# Patient Record
Sex: Male | Born: 1950 | Race: Black or African American | Hispanic: No | Smoking: Former smoker
Health system: Southern US, Community
[De-identification: ages and names within clinical notes are randomized; demographics above are authoritative.]

## PROBLEM LIST (undated history)

## (undated) DIAGNOSIS — I10 Essential (primary) hypertension: Secondary | ICD-10-CM

## (undated) DIAGNOSIS — J449 Chronic obstructive pulmonary disease, unspecified: Secondary | ICD-10-CM

## (undated) DIAGNOSIS — K219 Gastro-esophageal reflux disease without esophagitis: Secondary | ICD-10-CM

## (undated) DIAGNOSIS — E782 Mixed hyperlipidemia: Secondary | ICD-10-CM

## (undated) HISTORY — DX: Chronic obstructive pulmonary disease, unspecified: J44.9

## (undated) HISTORY — PX: HERNIA REPAIR: SHX51

---

## 2005-01-31 HISTORY — PX: OTHER SURGICAL HISTORY: SHX169

## 2005-01-31 HISTORY — PX: HERNIA REPAIR: SHX51

## 2007-03-30 ENCOUNTER — Emergency Department (HOSPITAL_COMMUNITY): Admission: EM | Admit: 2007-03-30 | Discharge: 2007-03-30 | Payer: Self-pay | Admitting: Emergency Medicine

## 2007-06-15 ENCOUNTER — Ambulatory Visit: Payer: Self-pay | Admitting: Pulmonary Disease

## 2007-06-15 DIAGNOSIS — R930 Abnormal findings on diagnostic imaging of skull and head, not elsewhere classified: Secondary | ICD-10-CM

## 2007-06-15 DIAGNOSIS — J438 Other emphysema: Secondary | ICD-10-CM

## 2007-06-22 ENCOUNTER — Telehealth (INDEPENDENT_AMBULATORY_CARE_PROVIDER_SITE_OTHER): Payer: Self-pay | Admitting: *Deleted

## 2007-06-29 ENCOUNTER — Encounter (INDEPENDENT_AMBULATORY_CARE_PROVIDER_SITE_OTHER): Payer: Self-pay | Admitting: *Deleted

## 2007-12-12 ENCOUNTER — Telehealth (INDEPENDENT_AMBULATORY_CARE_PROVIDER_SITE_OTHER): Payer: Self-pay | Admitting: *Deleted

## 2008-02-19 ENCOUNTER — Telehealth (INDEPENDENT_AMBULATORY_CARE_PROVIDER_SITE_OTHER): Payer: Self-pay | Admitting: *Deleted

## 2008-02-27 ENCOUNTER — Ambulatory Visit: Payer: Self-pay | Admitting: Pulmonary Disease

## 2008-02-27 LAB — CONVERTED CEMR LAB
BUN: 7 mg/dL (ref 6–23)
CO2: 32 meq/L (ref 19–32)
Creatinine, Ser: 0.8 mg/dL (ref 0.4–1.5)
GFR calc Af Amer: 128 mL/min
Glucose, Bld: 82 mg/dL (ref 70–99)

## 2008-02-28 ENCOUNTER — Ambulatory Visit: Payer: Self-pay | Admitting: Cardiology

## 2008-03-21 ENCOUNTER — Ambulatory Visit: Payer: Self-pay | Admitting: Pulmonary Disease

## 2008-03-21 ENCOUNTER — Telehealth: Payer: Self-pay | Admitting: Gastroenterology

## 2008-03-28 ENCOUNTER — Telehealth: Payer: Self-pay | Admitting: Pulmonary Disease

## 2008-03-28 ENCOUNTER — Encounter: Payer: Self-pay | Admitting: Pulmonary Disease

## 2008-04-09 ENCOUNTER — Encounter: Payer: Self-pay | Admitting: Pulmonary Disease

## 2008-04-11 ENCOUNTER — Ambulatory Visit: Payer: Self-pay | Admitting: Gastroenterology

## 2008-04-11 DIAGNOSIS — R933 Abnormal findings on diagnostic imaging of other parts of digestive tract: Secondary | ICD-10-CM

## 2008-04-17 ENCOUNTER — Ambulatory Visit: Payer: Self-pay | Admitting: Gastroenterology

## 2008-04-17 DIAGNOSIS — K2970 Gastritis, unspecified, without bleeding: Secondary | ICD-10-CM | POA: Insufficient documentation

## 2008-04-17 DIAGNOSIS — K2990 Gastroduodenitis, unspecified, without bleeding: Secondary | ICD-10-CM

## 2008-04-18 ENCOUNTER — Encounter: Payer: Self-pay | Admitting: Gastroenterology

## 2008-08-01 ENCOUNTER — Telehealth: Payer: Self-pay | Admitting: Pulmonary Disease

## 2008-09-24 ENCOUNTER — Ambulatory Visit: Payer: Self-pay | Admitting: Pulmonary Disease

## 2008-10-03 ENCOUNTER — Telehealth (INDEPENDENT_AMBULATORY_CARE_PROVIDER_SITE_OTHER): Payer: Self-pay | Admitting: *Deleted

## 2008-10-07 ENCOUNTER — Telehealth (INDEPENDENT_AMBULATORY_CARE_PROVIDER_SITE_OTHER): Payer: Self-pay | Admitting: *Deleted

## 2008-10-31 ENCOUNTER — Telehealth: Payer: Self-pay | Admitting: Pulmonary Disease

## 2009-03-10 ENCOUNTER — Telehealth: Payer: Self-pay | Admitting: Pulmonary Disease

## 2009-03-16 ENCOUNTER — Ambulatory Visit: Payer: Self-pay | Admitting: Pulmonary Disease

## 2009-03-20 ENCOUNTER — Telehealth: Payer: Self-pay | Admitting: Pulmonary Disease

## 2009-03-27 DIAGNOSIS — J441 Chronic obstructive pulmonary disease with (acute) exacerbation: Secondary | ICD-10-CM | POA: Insufficient documentation

## 2009-03-31 ENCOUNTER — Ambulatory Visit: Payer: Self-pay | Admitting: Pulmonary Disease

## 2009-03-31 ENCOUNTER — Telehealth: Payer: Self-pay | Admitting: Pulmonary Disease

## 2009-04-06 ENCOUNTER — Telehealth: Payer: Self-pay | Admitting: Pulmonary Disease

## 2009-04-23 ENCOUNTER — Telehealth (INDEPENDENT_AMBULATORY_CARE_PROVIDER_SITE_OTHER): Payer: Self-pay | Admitting: *Deleted

## 2009-05-01 ENCOUNTER — Telehealth (INDEPENDENT_AMBULATORY_CARE_PROVIDER_SITE_OTHER): Payer: Self-pay | Admitting: *Deleted

## 2009-05-11 ENCOUNTER — Telehealth: Payer: Self-pay | Admitting: Pulmonary Disease

## 2009-06-17 ENCOUNTER — Ambulatory Visit: Payer: Self-pay | Admitting: Pulmonary Disease

## 2009-06-23 ENCOUNTER — Telehealth (INDEPENDENT_AMBULATORY_CARE_PROVIDER_SITE_OTHER): Payer: Self-pay | Admitting: *Deleted

## 2009-07-09 ENCOUNTER — Telehealth (INDEPENDENT_AMBULATORY_CARE_PROVIDER_SITE_OTHER): Payer: Self-pay | Admitting: *Deleted

## 2009-07-16 ENCOUNTER — Telehealth (INDEPENDENT_AMBULATORY_CARE_PROVIDER_SITE_OTHER): Payer: Self-pay | Admitting: *Deleted

## 2009-09-12 ENCOUNTER — Encounter: Payer: Self-pay | Admitting: Pulmonary Disease

## 2009-09-15 ENCOUNTER — Ambulatory Visit: Payer: Self-pay | Admitting: Pulmonary Disease

## 2009-11-13 ENCOUNTER — Telehealth: Payer: Self-pay | Admitting: Pulmonary Disease

## 2009-11-25 ENCOUNTER — Telehealth (INDEPENDENT_AMBULATORY_CARE_PROVIDER_SITE_OTHER): Payer: Self-pay | Admitting: *Deleted

## 2010-01-04 ENCOUNTER — Telehealth: Payer: Self-pay | Admitting: Pulmonary Disease

## 2010-01-07 ENCOUNTER — Emergency Department (HOSPITAL_COMMUNITY): Admission: EM | Admit: 2010-01-07 | Discharge: 2009-06-03 | Payer: Self-pay | Admitting: Emergency Medicine

## 2010-01-13 ENCOUNTER — Ambulatory Visit: Payer: Self-pay | Admitting: Pulmonary Disease

## 2010-03-02 NOTE — Progress Notes (Signed)
Summary: needs disability papers  Phone Note Call from Patient Call back at [phone]   Caller: Patient Call For: clance Summary of Call: he needs his disability papers filled out. they have to to be in by the 28th. Initial call taken by: Valinda Hoar,  April 23, 2009 12:40 PM  Follow-up for Phone Call        per emr, it looks like [facility] has already filled out his part of the paperwork (see scanned reports) therefore paperwork must be down in [employer] with Fleet Contras.  called and spoke with pt to inform him of the above information.  pt states rachel has already called him and states paperwork should be ready tomorrow.  nothing further needed.  Aundra Millet Reynolds LPN  April 23, 2009 1:40 PM

## 2010-03-02 NOTE — Progress Notes (Signed)
  Phone Note Other Incoming   Request: Send information Summary of Call: Request for records received from [employer]. Request forwarded to [employer].     

## 2010-03-02 NOTE — Progress Notes (Signed)
Summary: diagnosis  Phone Note Call from Patient Call back at [phone]   Caller: Patient Call For: Hanalei Glace Summary of Call: pt have questions about diagnosis .disability is requesting Initial call taken by: Rickard Patience,  May 11, 2009 1:21 PM  Follow-up for Phone Call        called, spoke with pt.  Pt states he received a letter stating his disablilty claim is being held up because they need further information.  Requesting KC to dictate letter with his diagnosis on it and how long he should be out.  Claim # [id number].  requesting this to be faxed to [employer] [phone] once completed.  WIll forward to [facility] - pls advise if you will dictate this letter for pt.  Thanks! Gweneth Dimitri RN  May 11, 2009 3:53 PM   Additional Follow-up for Phone Call Additional follow up Details #1::        none of [place] do disability letters.  We typically fill  out paperwork the company sends [place], and they determine disability.  [employer] needs to tell me what more information they need.  If I have filled out the paperwork, it should be self explanatory.  Please find out if I have even filled out paperwork.  There are documents scanned into the chart that I havent even signed. Additional Follow-up by: Barbaraann Share MD,  May 11, 2009 4:25 PM    Additional Follow-up for Phone Call Additional follow up Details #2::    Looking over the papers signed by [facility] for disability diagnosis and time of disability is on the paperwork. i advised the pt that it might be best if [employer] calls [place] directly in regards to what is needed. He states he will do so. Carron Curie CMA  May 11, 2009 5:14 PM

## 2010-03-02 NOTE — Progress Notes (Signed)
Summary: prescript  Phone Note Call from Patient Call back at [phone]   Caller: Significant Otherstacey Call For: [name] Summary of Call: need 3 months supply of symbicort sent to [facility] Initial call taken by: Rickard Patience,  March 20, 2009 3:08 PM    Prescriptions: SYMBICORT 160-4.5 MCG/ACT  AERO (BUDESONIDE-FORMOTEROL FUMARATE) Two puffs twice daily  #3 x 3   Entered by:   Randell Loop CMA   Authorized by:   Barbaraann Share MD   Signed by:   Randell Loop CMA on 03/20/2009   Method used:   Faxed to ...       [facility] (mail-order)             ,          Ph: [phone]       Fax: [phone]   RxID:   [id number]

## 2010-03-02 NOTE — Progress Notes (Signed)
Summary: foms completed  Phone Note Call from Patient Call back at [phone]   Caller: Patient Call For: Dempsey Ahonen Summary of Call: Pt said [employer] is supposed to be faxing papers over today and he needs to have them completed ASAP. Initial call taken by: Darletta Moll,  April 06, 2009 8:22 AM  Follow-up for Phone Call        have you seen any papers on this pt? Carron Curie CMA  April 06, 2009 11:00 AM   yes, i just received paperwork off of fax machine.  will give to [facility] in [facility] to fill out paperwork first and then will be given to [facility] to sign.  Aundra Millet Reynolds LPN  April 07, 4694 1:53 PM   pt advised received and that it was sent to [employer]. Carron Curie CMA  April 06, 2009 2:28 PM

## 2010-03-02 NOTE — Assessment & Plan Note (Signed)
Summary: rov for emphysema   CC:  3 month follow up.  Pt states breathing is unchanged from last OV however he is coughing more frequently.  Cough is prod with white mucus.  Denies wheezing and chest tightness.  Smoking 1/2 ppd..  History of Present Illness: the pt comes in today for f/u of his known emphysema.  He is staying on his bronchodilator regimen, but unfortunately is continuing to smoke.  He has a chronic am smoker's cough, but is not having chest congestion or purulence.  His doe is at his usual baseline.  Preventive Screening-Counseling & Management  Alcohol-Tobacco     Smoking Status: current     Smoking Cessation Counseling: yes     Packs/Day: 0.5     Tobacco Counseling: to quit use of tobacco products  Current Medications (verified): 1)  Ventolin Hfa 108 (90 Base) Mcg/act Aers (Albuterol Sulfate) .... 2 Puffs Every 4-6 Hours As Needed For Shortness of Breath 2)  Symbicort 160-4.5 Mcg/act  Aero (Budesonide-Formoterol Fumarate) .... Two Puffs Twice Daily 3)  Spiriva Handihaler 18 Mcg  Caps (Tiotropium Bromide Monohydrate) .... One Puff in Handihaler Daily  Allergies (verified): No Known Drug Allergies  Social History: Packs/Day:  0.5  Review of Systems       The patient complains of shortness of breath with activity and productive cough.  The patient denies shortness of breath at rest, non-productive cough, coughing up blood, chest pain, irregular heartbeats, acid heartburn, indigestion, loss of appetite, weight change, abdominal pain, difficulty swallowing, sore throat, tooth/dental problems, headaches, nasal congestion/difficulty breathing through nose, sneezing, itching, ear ache, anxiety, depression, hand/feet swelling, joint stiffness or pain, rash, change in color of mucus, and fever.    Vital Signs:  Patient profile:   60 year old male Height:      74 inches Weight:      189.25 pounds BMI:     24.39 O2 Sat:      91 % on Room air Temp:     98.3 degrees F  oral Pulse rate:   96 / minute BP sitting:   124 / 82  (right arm) Cuff size:   regular  Vitals Entered By: Gweneth Dimitri RN (September 15, 2009 9:45 AM)  O2 Flow:  Room air CC: 3 month follow up.  Pt states breathing is unchanged from last OV however he is coughing more frequently.  Cough is prod with white mucus.  Denies wheezing and chest tightness.  Smoking 1/2 ppd. Comments Medications reviewed with patient Daytime contact number verified with patient. Gweneth Dimitri RN  September 15, 2009 9:45 AM    Physical Exam  General:  wd male in nad Lungs:  mild decreased bs, no wheezing or rhonchi  Heart:  rrr, no mrg Extremities:  no edema noted, no cyanosis  Neurologic:  alert and oriented, moves all 4.   Impression & Recommendations:  Problem # 1:  EMPHYSEMA (ICD-492.8)  the pt is staying on his meds, and is participating in pulmonary rehab at [facility].  I have told him his cough and mucus will resolve if he is able to stop smoking.  This is the key to further improvment for him.  He is to stay on current meds, and to f/u with me in 4mos.  Other Orders: Est. Patient Level III (13086) Tobacco use cessation intermediate 3-10 minutes (57846)  Patient Instructions: 1)  no change in meds. 2)  stop smoking!! 3)  followup with me in 4mos or sooner if having problems.  Prescriptions: SPIRIVA HANDIHALER 18 MCG  CAPS (TIOTROPIUM BROMIDE MONOHYDRATE) one puff in handihaler daily  #30 x 6   Entered and Authorized by:   Barbaraann Share MD   Signed by:   Barbaraann Share MD on 09/15/2009   Method used:   Print then Give to Patient   RxID:   [id number] SYMBICORT 160-4.5 MCG/ACT  AERO (BUDESONIDE-FORMOTEROL FUMARATE) Two puffs twice daily  #1 x 6   Entered and Authorized by:   Barbaraann Share MD   Signed by:   Barbaraann Share MD on 09/15/2009   Method used:   Print then Give to Patient   RxID:   [id number] VENTOLIN HFA 108 (90 BASE) MCG/ACT AERS (ALBUTEROL SULFATE) 2 puffs every  4-6 hours as needed for shortness of breath  #1 x 6   Entered and Authorized by:   Barbaraann Share MD   Signed by:   Barbaraann Share MD on 09/15/2009   Method used:   Print then Give to Patient   RxID:   [id number]

## 2010-03-02 NOTE — Progress Notes (Signed)
Summary: Records request from [employer]  Request for records received from [employer].Request forwarded to [employer]. Jacob Pace  May 01, 2009 10:11 AM

## 2010-03-02 NOTE — Progress Notes (Signed)
Summary: symbicort/ [facility]     Phone Note Call from Patient   Caller: Other Relative Call For: Ajaya Crutchfield Summary of Call: caller stacy malloy calling on pt's behalf to request symbicort - 90 days supply- pls call in to [facility]. caller's # [phone] Initial call taken by: Tivis Ringer, CNA,  March 10, 2009 9:29 AM  Follow-up for Phone Call        pt last saw [facility] 09-24-2008 and was told to f/u in 6 months.  No appts sched.  LMOMTCBx2.  Pt needs to make f/u appt and then we can send rx to [facility].  Arman Filter LPN  March 10, 2009 9:42 AM   Pt has appt schedulled 03/16/2009 @ 11:45am. Follow-up by: Eugene Gavia,  March 11, 2009 9:36 AM  Additional Follow-up for Phone Call Additional follow up Details #1::        Spoke with Aundra Millet about this; ok to give sample until pt comes in and then get a Rx for 90 day supply at OV. Pt is aware and sample at front desk. Reynaldo Minium CMA  March 11, 2009 10:40 AM

## 2010-03-02 NOTE — Progress Notes (Signed)
Summary: pt took prednisone incorrectly  Phone Note Call from Patient   Caller: Significant Other Call For: Edith Groleau Summary of Call: pt's girlfriend calling re: pt. says he took prednisone "4 pills at one time - "4 x a day" now is out! call Jimmye Norman at [phone] Initial call taken by: Tivis Ringer, CNA,  March 20, 2009 11:04 AM  Follow-up for Phone Call        caller advised pt took 4 pills four times a day on Tues and Wed and is now out. caller advised pt's breathing has not changed, pt is still smoking, and pt has not had any reations to same. Please advise. Thanks. Zackery Barefoot CMA  March 20, 2009 11:52 AM   Additional Follow-up for Phone Call Additional follow up Details #1::        let him know that we are going to see how he does, no further prednisone.  He must quit smoking if he wants to get better.  Keep upcoming apptm Additional Follow-up by: Barbaraann Share MD,  March 20, 2009 5:07 PM    Additional Follow-up for Phone Call Additional follow up Details #2::    called pts girlfriend stacey---she is aware of KC recs and is also aware of symbicort that has been faxed to [facility]---she is aware of appt on 3-1 and will let pt know to stop all smoking. Randell Loop CMA  March 20, 2009 5:20 PM

## 2010-03-02 NOTE — Assessment & Plan Note (Signed)
Summary: acute sick visit for worsening dyspnea   Copy to:  Marcelyn Bruins, MD Primary Provider/Referring Provider:  n/a  CC:  Pt is here for a 6 month f/u appt.  Pt states his breathing has gotten worse.  More sob with exertion.  Pt states he is using his Ventolin every day.  Pt states he has decreased his smoking to 4 cigs daily.  Pt also c/o "loss of bladder during stressful situations. " .  History of Present Illness: The pt comes in today for f/u of his known emphysema.  He is maintaining on his bronchodilator regimen, but unfortunately continues to smoke.  He feels that his breathing is a little worse than usual, but denies chest congestion, tightness, or purulence.  He has not had f/c/s, no wheezing.  Medications Prior to Update: 1)  Ventolin Hfa 108 (90 Base) Mcg/act Aers (Albuterol Sulfate) .... 2 Puffs Every 4-6 Hours As Needed For Shortness of Breath 2)  Symbicort 160-4.5 Mcg/act  Aero (Budesonide-Formoterol Fumarate) .... Two Puffs Twice Daily 3)  Spiriva Handihaler 18 Mcg  Caps (Tiotropium Bromide Monohydrate) .... One Puff in Handihaler Daily  Allergies (verified): No Known Drug Allergies  Review of Systems       The patient complains of shortness of breath with activity, shortness of breath at rest, and non-productive cough.  The patient denies productive cough, coughing up blood, chest pain, irregular heartbeats, acid heartburn, indigestion, loss of appetite, weight change, abdominal pain, difficulty swallowing, sore throat, tooth/dental problems, headaches, nasal congestion/difficulty breathing through nose, sneezing, itching, ear ache, anxiety, depression, hand/feet swelling, joint stiffness or pain, rash, change in color of mucus, and fever.    Vital Signs:  Patient profile:   60 year old male Height:      74 inches Weight:      195.25 pounds BMI:     25.16 O2 Sat:      90 % on Room air Temp:     98.3 degrees F oral Pulse rate:   84 / minute BP sitting:   134 / 92   (right arm) Cuff size:   regular  Vitals Entered By: Arman Filter LPN (March 16, 2009 11:54 AM)  O2 Flow:  Room air CC: Pt is here for a 6 month f/u appt.  Pt states his breathing has gotten worse.  More sob with exertion.  Pt states he is using his Ventolin every day.  Pt states he has decreased his smoking to 4 cigs daily.  Pt also c/o "loss of bladder during stressful situations. "  Comments Medications reviewed with patient Arman Filter LPN  March 16, 2009 11:54 AM    Physical Exam  General:  wd male in nad Lungs:  decreased bs throughout, but no wheezing Heart:  rrr, no mrg Extremities:  no edema or cyanosis Neurologic:  alert and oriented, moves all 4.   Impression & Recommendations:  Problem # 1:  EMPHYSEMA (ICD-492.8)  I suspect the pt has an early copd exacerbation.  I have explained to him that no medications will keep him well if he continues to smoke.  Will treat him with a short course of prednisone to see if he improves, but he MUST stop smoking.  He is to call us if he develops increased congestion or purulence which may require abx.  Medications Added to Medication List This Visit: 1)  Prednisone 10 Mg Tabs (Prednisone) .... 4 each day for 4 days, then 2 each day for 4 days, then 1 each  day for 4 days, then stop  Other Orders: Est. Patient Level IV (83151)  Patient Instructions: 1)  trial of prednisone for next 2 weeks to see if we can improve things 2)  stay on symbicort and spiriva religiously 3)  you must stop smoking. 4)  followup with me in 2 weeks.   Prescriptions: PREDNISONE 10 MG  TABS (PREDNISONE) 4 each day for 4 days, then 2 each day for 4 days, then 1 each day for 4 days, then stop  #qs x 0   Entered and Authorized by:   Barbaraann Share MD   Signed by:   Barbaraann Share MD on 03/16/2009   Method used:   Print then Give to Patient   RxID:   [id number]

## 2010-03-02 NOTE — Progress Notes (Signed)
Summary: [place] Approval for Symbicort 160/4.5 11-25-09 through 11-25-11  Phone Note Outgoing Call   Call placed by: Reynaldo Minium CMA,  November 25, 2009 4:45 PM Call placed to: [place] Dept-[place] Summary of Call: Recieved [place] ([facility] [phone]); needed on Symbicort 160/4.5 inhaler; spoke with insurance-approval form 11-25-09 through 11-25-11. I spoke with Pharmacy and they are aware. Initial call taken by: Reynaldo Minium CMA,  November 25, 2009 4:47 PM

## 2010-03-02 NOTE — Progress Notes (Signed)
Summary: SAMPLE of symbicort  Phone Note Call from Patient   Caller: CAREGIVER STACY Call For: [facility] Summary of Call: WANTS SAMPLE OF SYMBICORT. CALL [phone] OR [phone] Initial call taken by: Tivis Ringer, CNA,  January 04, 2010 10:07 AM  Follow-up for Phone Call        Pt advised no samples available at this time. Carron Curie CMA  January 04, 2010 10:37 AM

## 2010-03-02 NOTE — Letter (Signed)
Summary: CMN/[facility]  CMN/[facility]   Imported By: L[place] 09/16/2009 10:00:24  _____________________________________________________________________  External Attachment:    Type:   Image     Comment:   External Document

## 2010-03-02 NOTE — Assessment & Plan Note (Signed)
Summary: rov for severe emphysema   Primary Provider/Referring Provider:  n/a  CC:  2 week follow up.  states breathing has gotten "a little bit worse."  having increased SOB with exertion and now using ventolin 6-10 times/day.  also having wheezing and chest tightness with exertion.  currently smoking 3 cig/day.  pt finished pred taper this week.  requesting rx for ventolin.Jacob Pace  History of Present Illness: The pt comes in today for ongoing issues with his breathing.  He has known severe emphysema, and unfortunately continues to smoke.  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  He is overusing albuterol during this time.  He was recently treated with a course of prednisone, but did not feel it helped very much.  He continues to have cough with discolored mucus only in the am's upon arising, then clears for the rest of the day.  He denies noncompliance with his maintenance bronchodilator regimen.  Current Medications (verified): 1)  Ventolin Hfa 108 (90 Base) Mcg/act Aers (Albuterol Sulfate) .... 2 Puffs Every 4-6 Hours As Needed For Shortness of Breath 2)  Symbicort 160-4.5 Mcg/act  Aero (Budesonide-Formoterol Fumarate) .... Two Puffs Twice Daily 3)  Spiriva Handihaler 18 Mcg  Caps (Tiotropium Bromide Monohydrate) .... One Puff in Handihaler Daily  Allergies (verified): No Known Drug Allergies  Review of Systems      See HPI  Vital Signs:  Patient profile:   60 year old male Height:      74 inches Weight:      193.38 pounds BMI:     24.92 O2 Sat:      92 % on Room air Temp:     98.2 degrees F oral Pulse rate:   86 / minute BP sitting:   126 / 86  (left arm) Cuff size:   regular  Vitals Entered By: Gweneth Dimitri RN (March 31, 2009 9:26 AM)  O2 Flow:  Room air CC: 2 week follow up.  states breathing has gotten "a little bit worse."  having increased SOB with exertion,  now using ventolin 6-10 times/day.  also having wheezing and chest tightness with  exertion.  currently smoking 3 cig/day.  pt finished pred taper this week.  requesting rx for ventolin. Comments Medications reviewed with patient Daytime contact number verified with patient. Crystal Jones RN  March 31, 2009 9:25 AM   Ambulatory Pulse Oximetry  Resting; HR_87____    02 Sat__94%RA___  Lap1 (185 feet)   HR__107___   02 Sat__91% RA___ Lap2 (185 feet)   HR__107___   02 Sat___87% RA__    Lap3 (185 feet)   HR_____   02 Sat_____  ___Test Completed without Difficulty _x__Test Stopped due to:      o2 sat 87% RA at end of 2nd lap.  Pt placed on 2L o2-o2 sat increased to 90%. Gweneth Dimitri RN  March 31, 2009 10:25 AM    Physical Exam  General:  thin male in nad Lungs:  very decreased bs throughout, no wheezing or rhonchi Heart:  rrr Extremities:  no edema or cyanosis Neurologic:  alert and and oriented, moves all 4.   Impression & Recommendations:  Problem # 1:  EMPHYSEMA (ICD-492.8)  the pt has severe disease, and I suspect is making things worse with his attempts to do physical work during the week.  He is on an excellent bronchodilator regimen, but needs to quit smoking.  I also think he would benefit  from pulmonary rehab and also supplemental oxygen.  I have asked him to apply for short term or longterm disability with his employer.  He apparently has already applied for social services disability, and is in the waiting period.  He will go ahead and do this, and I will fill out appropriate paperwork when available.    Other Orders: Est. Patient Level III (04540) Pulse Oximetry, Ambulatory (98119) DME Referral (DME) Rehabilitation Referral (Rehab)  Patient Instructions: 1)  continue on current inhaler regimen 2)  you need to discuss disability with your employer 3)  will start on oxygen at bedtime, and with exertional activities. 4)  cancel any upcoming apptms, and followup with me in 8wks or sooner if having problems. 5)  will refer to pulmonary rehab  program at [facility].   Prescriptions: VENTOLIN HFA 108 (90 BASE) MCG/ACT AERS (ALBUTEROL SULFATE) 2 puffs every 4-6 hours as needed for shortness of breath  #1 x 6   Entered and Authorized by:   Barbaraann Share MD   Signed by:   Barbaraann Share MD on 03/31/2009   Method used:   Print then Give to Patient   RxID:   [id number]    Immunization History:  Influenza Immunization History:    Influenza:  historical (02/01/2008)

## 2010-03-02 NOTE — Progress Notes (Signed)
Summary: CLEARANCE-LMTCB x 1  Phone Note From Other Clinic Call back at [phone] EX [phone]   Caller: Patient Caller: SOCIAL SECURITY DISABILITY ( [place]) Call For: [facility] Summary of Call: NEED CLEARANCE FOR PT TO HAVE PFT Initial call taken by: Rickard Patience,  July 16, 2009 3:30 PM  Follow-up for Phone Call        Spoke with Mardelle Matte.  They are needing a PFT on pt to determine whether he will qualify for disability.  Last PFT was done was 1/10'.  They need a more current one.  Can we order PFT for pt? Please advise, thanks! Follow-up by: Vernie Murders,  July 16, 2009 4:10 PM  Additional Follow-up for Phone Call Additional follow up Details #1::        yes it is ok for him to have pfts with the social security office to assess for disability Additional Follow-up by: Barbaraann Share MD,  July 17, 2009 5:12 PM    Additional Follow-up for Phone Call Additional follow up Details #2::    LMOMTCB Vernie Murders  July 17, 2009 5:14 PM  Returning call.Darletta Moll  July 20, 2009 1:48 PM  Spoke with Mardelle Matte and advised per [facility], okay to set pt up for PFT's.  Mardelle Matte verbaliZed understanding.  Follow-up by: Vernie Murders,  July 20, 2009 1:51 PM

## 2010-03-02 NOTE — Progress Notes (Signed)
Summary: 90 day supply of inhalers  Phone Note Call from Patient Call back at [phone]   Caller: Spouse-Stacy Call For: [facility] Summary of Call: Pt needs 90 day supply of spiriva, ventolin, [facility]ort [facility]. Rx sent. pt aware. Carron Curie CMA  November 13, 2009 9:43 AM  Initial call taken by: Carron Curie CMA,  November 13, 2009 9:43 AM    Prescriptions: SPIRIVA HANDIHALER 18 MCG  CAPS (TIOTROPIUM BROMIDE MONOHYDRATE) one puff in handihaler daily  #90 x 0   Entered by:   Carron Curie CMA   Authorized by:   Barbaraann Share MD   Signed by:   Carron Curie CMA on 11/13/2009   Method used:   [facility]  [address] [address] [id number]       [address]       [place], [place]  [address]       Ph: [phone] or [phone]       Fax: [phone]   RxID:   [id number] SYMBICORT 160-4.5 MCG/ACT  AERO (BUDESONIDE-FORMOTEROL FUMARATE) Two puffs twice daily  #3 x 0   Entered by:   Carron Curie CMA   Authorized by:   Barbaraann Share MD   Signed by:   Carron Curie CMA on 11/13/2009   Method used:   [facility]        [facility]  [address] [address] [id number]       [address]       [place], [place]  [address]       Ph: [phone] or [phone]       Fax: [phone]   RxID:   [id number] VENTOLIN HFA 108 (90 BASE) MCG/ACT AERS (ALBUTEROL SULFATE) 2 puffs every 4-6 hours as needed for shortness of breath  #3 x 0   Entered by:   Carron Curie CMA   Authorized by:   Barbaraann Share MD   Signed by:   Carron Curie CMA on 11/13/2009   Method used:   [facility]        [facility]  [address] [address] [id number]       [address]       [place], [place]  [address]       Ph: [phone] or [phone]       Fax: [phone]   RxID:   [id number]

## 2010-03-02 NOTE — Assessment & Plan Note (Signed)
Summary: rov for emphysema   Primary Provider/Referring Provider:  n/a  CC:  Pt is here for an 8 week f/u appt.   Pt states he went to [facility] approx 2 weeks because he "passed out and fell and fx his ribs."  Pt states breathing is unchanged from last visit.  Pt also c/o coughing up white sputum. Pt also c/o L sided chest pain d/t fx ribs.  Pt is currently smoking 1/2 ppd. .  History of Present Illness: the pt comes in today for f/u of his known severe emphysema with chronic respiratory failure.  He has been near his usual baseline, and denies any recent pulmonary infection or acute exacerbation.  He has had more sob recently since a fall where he suffered a fractured rib.  He denies any chest congestion, cough or significance, or purulence.  Unfortunately, he continues to smoke.  Medications Prior to Update: 1)  Ventolin Hfa 108 (90 Base) Mcg/act Aers (Albuterol Sulfate) .... 2 Puffs Every 4-6 Hours As Needed For Shortness of Breath 2)  Symbicort 160-4.5 Mcg/act  Aero (Budesonide-Formoterol Fumarate) .... Two Puffs Twice Daily 3)  Spiriva Handihaler 18 Mcg  Caps (Tiotropium Bromide Monohydrate) .... One Puff in Handihaler Daily  Allergies (verified): No Known Drug Allergies  Review of Systems      See HPI  Vital Signs:  Patient profile:   60 year old male Height:      74 inches Weight:      192 pounds BMI:     24.74 O2 Sat:      92 % on Room air Temp:     98.5 degrees F oral Pulse rate:   84 / minute BP sitting:   118 / 84  (right arm) Cuff size:   regular  Vitals Entered By: Arman Filter LPN (Jun 17, 2009 11:14 AM)  O2 Flow:  Room air CC: Pt is here for an 8 week f/u appt.   Pt states he went to [facility] approx 2 weeks because he "passed out and fell and fx his ribs."  Pt states breathing is unchanged from last visit.  Pt also c/o coughing up white sputum. Pt also c/o L sided chest pain d/t fx ribs.  Pt is currently smoking 1/2 ppd.  Comments Medications reviewed with patient Arman Filter LPN  Jun 17, 2009 11:14 AM    Physical Exam  General:  thin male in nad Lungs:  decreased bs throughout, no wheezing or rhonchi Heart:  rrr Extremities:  no edema or cyanosis Neurologic:  alert and oriented, moves all 4.   Impression & Recommendations:  Problem # 1:  EMPHYSEMA (ICD-492.8) the pt has severe emphysema with chronic respiratory failure.  He is near his usual baseline on his current bronchodilator regimen and supplemental oxygen.  I have stressed to him again the importance of total smoking cessation, and that he has no chance of remaining stable if he does not quit.  Other Orders: Est. Patient Level III (81191) Tobacco use cessation intermediate 3-10 minutes (47829)  Patient Instructions: 1)  stay on meds as directed 2)  STOP smoking 3)  wear oxygen as much as you can with exertion. 4)  continue in pulmonary rehab 5)  think about having neb treatments at home for use as needed. 6)  followup with me in 3mos or sooner if having issues.

## 2010-03-02 NOTE — Progress Notes (Signed)
Summary: papers filled out and talk to nurse  Phone Note Call from Patient Call back at [phone]   Caller: Patient Call For: Tavonte Seybold Summary of Call: patient called and said they were faxing a paper that needed to be filled out today and sent back. it is regarding him being out of work. He said dr Aundrea Horace told him not to go back to work. If the paper cant be filled out and faxed back then could we please send a letter back to him so he can give it to his job. Per patient call him so he can discuss this with a nurse.  Initial call taken by: Valinda Hoar,  March 31, 2009 12:11 PM  Follow-up for Phone Call        received FMLA paperwork via fax and send this paperwork down to [place] in [employer] to fill out. However, in the meantime, pt states he needs a note from dr. Shelle Iron AV:[id number] him out of work.  Pt requests this dr note be faxed asap tomorrow to [employer] attn:  Clent Jacks, RN  Fax # [phone] .Marland KitchenAundra Millet Reynolds LPN  March 31, 3084 2:30 PM   Additional Follow-up for Phone Call Additional follow up Details #1::        called and left message with someone other than pt to call me tomorrow to give me info about what is supposed to be in note. ?permanent disability, short term, etc. Additional Follow-up by: Barbaraann Share MD,  April 01, 2009 5:08 PM

## 2010-03-02 NOTE — Progress Notes (Signed)
  Phone Note Other Incoming   Request: Send information Summary of Call: Request for records received from [employer]. Request forwarded to [employer].     

## 2010-03-05 ENCOUNTER — Telehealth (INDEPENDENT_AMBULATORY_CARE_PROVIDER_SITE_OTHER): Payer: Self-pay | Admitting: *Deleted

## 2010-03-10 NOTE — Progress Notes (Signed)
Summary: refill  Phone Note Call from Patient Call back at [phone]   Caller: Patient Call For: clance Reason for Call: Talk to Nurse Summary of Call: Patient needing refill--ventolin--[facility] Initial call taken by: Lehman Prom,  March 05, 2010 4:27 PM  Follow-up for Phone Call        Spoke with pt and advised needs appt b/c overdue for rov.  He sched appt for 03/17/10 at noon.  Rx refilled and pt aware must keep this appt for future refills. Follow-up by: Vernie Murders,  March 05, 2010 4:43 PM

## 2010-03-17 ENCOUNTER — Ambulatory Visit: Payer: Self-pay | Admitting: Pulmonary Disease

## 2010-03-31 ENCOUNTER — Telehealth (INDEPENDENT_AMBULATORY_CARE_PROVIDER_SITE_OTHER): Payer: Self-pay | Admitting: *Deleted

## 2010-04-02 ENCOUNTER — Encounter: Payer: Self-pay | Admitting: Pulmonary Disease

## 2010-04-02 ENCOUNTER — Ambulatory Visit (INDEPENDENT_AMBULATORY_CARE_PROVIDER_SITE_OTHER): Payer: Self-pay | Admitting: Pulmonary Disease

## 2010-04-02 DIAGNOSIS — J209 Acute bronchitis, unspecified: Secondary | ICD-10-CM | POA: Insufficient documentation

## 2010-04-02 DIAGNOSIS — J438 Other emphysema: Secondary | ICD-10-CM

## 2010-04-02 DIAGNOSIS — J441 Chronic obstructive pulmonary disease with (acute) exacerbation: Secondary | ICD-10-CM

## 2010-04-07 ENCOUNTER — Telehealth (INDEPENDENT_AMBULATORY_CARE_PROVIDER_SITE_OTHER): Payer: Self-pay | Admitting: *Deleted

## 2010-04-08 NOTE — Progress Notes (Signed)
  Phone Note Other Incoming   Request: Send information Summary of Call: Request for records received from [employer]. Request forwarded to [employer].  07/01/2009 to present-Clance

## 2010-04-08 NOTE — Assessment & Plan Note (Signed)
Summary: acute sick visit for copd exacerbation.   Copy to:  Marcelyn Bruins, MD Primary Provider/Referring Provider:  n/a  CC:  routine f/u appt.  pt states he has deceased his smoking down to 1 pack every 2 days.   pt states breathing is unchanged from last visit- still has sob with exertion.  pt states he currently has a "cold"  C/o coughing up green sputum and tightness in chest.  Denies sore throat or fever. Jacob Pace  History of Present Illness: The pt comes in for an acute sick visit.  He has known emphysema, and gives a one week h/o worsening chest congestion, cough with purulence, and worsening sob.  Unfortunately, continues to smoke, but has been able to cut back.  Preventive Screening-Counseling & Management  Alcohol-Tobacco     Smoking Status: current     Smoking Cessation Counseling: yes     Packs/Day: 0.5     Tobacco Counseling: to quit use of tobacco products  Medications Prior to Update: 1)  Ventolin Hfa 108 (90 Base) Mcg/act Aers (Albuterol Sulfate) .... 2 Puffs Every 4-6 Hours As Needed For Shortness of Breath 2)  Symbicort 160-4.5 Mcg/act  Aero (Budesonide-Formoterol Fumarate) .... Two Puffs Twice Daily 3)  Spiriva Handihaler 18 Mcg  Caps (Tiotropium Bromide Monohydrate) .... One Puff in Handihaler Daily  Allergies (verified): No Known Drug Allergies  Past History:  Past medical, surgical, family and social histories (including risk factors) reviewed, and no changes noted (except as noted below).  Past Medical History: Reviewed history from 04/11/2008 and no changes required. COPD/emphysema  Past Surgical History: Reviewed history from 04/11/2008 and no changes required. Right inguinal hernia repair, 2007  Family History: Reviewed history from 04/11/2008 and no changes required. Father-cancer ? type sister-breast CA No FH of Colon Cancer:  Social History: Reviewed history from 09/24/2008 and no changes required. Patient is a current smoker. Smokes 1/2 ppd x 40  years. currently smoking 1 pack every 2 days.  Pt is seperated with children. Pt works in a Education officer, environmental. Alcohol Use - yes 2 beer/day Daily Caffeine Use 1 cup coffee/day Illicit Drug Use - no  Review of Systems       The patient complains of shortness of breath with activity, productive cough, weight change, headaches, nasal congestion/difficulty breathing through nose, sneezing, and change in color of mucus.  The patient denies shortness of breath at rest, non-productive cough, coughing up blood, chest pain, irregular heartbeats, acid heartburn, indigestion, loss of appetite, abdominal pain, difficulty swallowing, sore throat, tooth/dental problems, itching, ear ache, anxiety, depression, hand/feet swelling, joint stiffness or pain, rash, and fever.    Vital Signs:  Patient profile:   60 year old male Height:      74 inches Weight:      204.13 pounds O2 Sat:      83 % on Room air Temp:     98.4 degrees F oral Pulse rate:   106 / minute BP sitting:   156 / 98  (left arm) Cuff size:   regular  Vitals Entered By: Arman Filter LPN (April 01, 1608 9:16 AM)  O2 Flow:  Room air CC: routine f/u appt.  pt states he has deceased his smoking down to 1 pack every 2 days.   pt states breathing is unchanged from last visit- still has sob with exertion.  pt states he currently has a "cold"  C/o coughing up green sputum and tightness in chest.  Denies sore throat or fever.  Comments  91  89   Physical Exam  General:  wd male in nad  Nose:  no purulence or discharge noted. Lungs:  decreased bs with a few wheezes and rhonchi Heart:  rrr, no mrg Extremities:  no edema or cyanosis  Neurologic:  alert and oriented, moves all 4    Impression & Recommendations:  Problem # 1:  CHRONIC OBSTRUCTIVE PULMONARY DISEASE, ACUTE EXACERBATION (ICD-491.21) the pt presents with symptoms c/w copd exacerbation.  He has an acute bronchitis, and clearly has pressured breathing on exam today.  He is at risk  for decompensation, and will therefore treat aggressively with abx and prednisone pulse.  I have stressed to him again the need to completely stop smoking, and he can expect ongoing exacerbations if he does not.  Problem # 2:  BRONCHITIS, ACUTE (ICD-466.0)  Medications Added to Medication List This Visit: 1)  Levaquin 750 Mg Tabs (Levofloxacin) .... One tablet by mouth daily 2)  Prednisone 10 Mg Tabs (Prednisone) .... Take 4 each day for 2 days, then 3 each day for 2 days, then 2 each day for 2 days, then 1 each day for 2 days, then stop  Other Orders: Est. Patient Level IV (33295) Tobacco use cessation intermediate 3-10 minutes (18841)  Patient Instructions: 1)  levaquin 750mg  one each day for 5 days 2)  prednisone as directed 3)  stop smoking completely 4)  followup with me in 6mos, or sooner if not getting better.   Prescriptions: PREDNISONE 10 MG  TABS (PREDNISONE) take 4 each day for 2 days, then 3 each day for 2 days, then 2 each day for 2 days, then 1 each day for 2 days, then stop  #20 x 0   Entered and Authorized by:   Barbaraann Share MD   Signed by:   Barbaraann Share MD on 04/02/2010   Method used:   Print then Give to Patient   RxID:   [id number] LEVAQUIN 750 MG  TABS (LEVOFLOXACIN) One tablet by mouth daily  #5 x 0   Entered and Authorized by:   Barbaraann Share MD   Signed by:   Barbaraann Share MD on 04/02/2010   Method used:   Print then Give to Patient   RxID:   [id number]   Appended Document: acute sick visit for copd exacerbation. rechecked pt's o2 sats after pt rested and took some deep breaths.  o2 sats incrased to 91% RA at rest.

## 2010-04-13 NOTE — Progress Notes (Signed)
Summary: samples  Phone Note Call from Patient Call back at [phone]   Caller: stacy//caregiver Call For: clance Summary of Call: Request samples of symbicort 160-4.7mcg. Initial call taken by: Darletta Moll,  April 07, 2010 9:43 AM  Follow-up for Phone Call        [facility] Vernie Murders  April 07, 2010 10:21 AM  Spoke with pt's caregiver.  She states that symbicort not covered under ins.  I advised that our records show that we did PA in Oct. 2011 and got med approved from then until Oct 2013.  Then, come to find out, he does not have any ins at this time, so this is why he is unable to obtain med. Pt wants to know if [facility] will want to change symbicort to a different med, or do you wish to have pt go through the pt assistance program. Pls advise thanks Follow-up by: Vernie Murders,  April 07, 2010 10:54 AM  Additional Follow-up for Phone Call Additional follow up Details #1::        we can give him 2 boxes, and get him to come in for the pt assistance program paperwork Additional Follow-up by: Barbaraann Share MD,  April 07, 2010 4:48 PM    Additional Follow-up for Phone Call Additional follow up Details #2::    Spoke with pt and advised will leave samples and pt assistance forms up front for pick up.  I advised that once he fills out his portion, needs to return so we can do out part and then send to company for approval.  Pt verbalized understanding. Follow-up by: Vernie Murders,  April 07, 2010 5:03 PM

## 2010-04-20 LAB — COMPREHENSIVE METABOLIC PANEL
CO2: 27 mEq/L (ref 19–32)
GFR calc Af Amer: >60 mL/min (ref >60)
Glucose, Bld: 108 mg/dL — ABNORMAL HIGH (ref 70–99)
Sodium: 136 mEq/L (ref 135–145)
Total Protein: 6.8 g/dL (ref 6.0–8.3)

## 2010-04-20 LAB — CBC
Hemoglobin: 15.3 g/dL (ref 13.0–17.0)
MCHC: 34 g/dL (ref 30.0–36.0)
MCV: 91.1 fL (ref 78.0–100.0)
Platelets: 221 10*3/uL (ref 150–400)
RBC: 4.94 MIL/uL (ref 4.22–5.81)

## 2010-04-20 LAB — DIFFERENTIAL
Basophils Absolute: 0.1 10*3/uL (ref 0.0–0.1)
Basophils Relative: 1 % (ref 0–1)
Eosinophils Absolute: 0.2 10*3/uL (ref 0.0–0.7)
Eosinophils Relative: 1 % (ref 0–5)
Lymphocytes Relative: 13 % (ref 12–46)
Lymphs Abs: 1.5 10*3/uL (ref 0.7–4.0)
Monocytes Absolute: 0.4 10*3/uL (ref 0.1–1.0)
Monocytes Relative: 4 % (ref 3–12)

## 2010-08-18 ENCOUNTER — Other Ambulatory Visit: Payer: Self-pay | Admitting: Pulmonary Disease

## 2010-08-19 NOTE — Telephone Encounter (Signed)
Note: Caller was pt's girlfriend stacey she can be reached at [phone].Jacob Pace

## 2010-08-19 NOTE — Telephone Encounter (Signed)
Pt checking on status of refill for ventolin.Jacob Pace

## 2010-08-20 NOTE — Telephone Encounter (Signed)
LMLM to inform pt's girlfriend that rx has been sent to the pharmacy.

## 2010-09-30 ENCOUNTER — Encounter: Payer: Self-pay | Admitting: Pulmonary Disease

## 2010-10-05 ENCOUNTER — Ambulatory Visit: Payer: Self-pay | Admitting: Pulmonary Disease

## 2010-12-23 ENCOUNTER — Other Ambulatory Visit: Payer: Self-pay | Admitting: Pulmonary Disease

## 2012-02-17 ENCOUNTER — Encounter (HOSPITAL_COMMUNITY): Payer: Self-pay | Admitting: *Deleted

## 2012-02-17 ENCOUNTER — Emergency Department (HOSPITAL_COMMUNITY)
Admission: EM | Admit: 2012-02-17 | Discharge: 2012-02-18 | Disposition: A | Discharge status: Home / Self Care | Payer: Medicare Other | Attending: Emergency Medicine | Admitting: Emergency Medicine

## 2012-02-17 ENCOUNTER — Emergency Department (HOSPITAL_COMMUNITY): Payer: Medicare Other

## 2012-02-17 DIAGNOSIS — F10239 Alcohol dependence with withdrawal, unspecified: Secondary | ICD-10-CM

## 2012-02-17 DIAGNOSIS — J449 Chronic obstructive pulmonary disease, unspecified: Secondary | ICD-10-CM | POA: Insufficient documentation

## 2012-02-17 DIAGNOSIS — Z8709 Personal history of other diseases of the respiratory system: Secondary | ICD-10-CM | POA: Insufficient documentation

## 2012-02-17 DIAGNOSIS — F17200 Nicotine dependence, unspecified, uncomplicated: Secondary | ICD-10-CM | POA: Insufficient documentation

## 2012-02-17 DIAGNOSIS — R5381 Other malaise: Secondary | ICD-10-CM | POA: Insufficient documentation

## 2012-02-17 DIAGNOSIS — J4489 Other specified chronic obstructive pulmonary disease: Secondary | ICD-10-CM | POA: Insufficient documentation

## 2012-02-17 DIAGNOSIS — F10939 Alcohol use, unspecified with withdrawal, unspecified: Secondary | ICD-10-CM | POA: Insufficient documentation

## 2012-02-17 DIAGNOSIS — R062 Wheezing: Secondary | ICD-10-CM | POA: Insufficient documentation

## 2012-02-17 DIAGNOSIS — Z79899 Other long term (current) drug therapy: Secondary | ICD-10-CM | POA: Insufficient documentation

## 2012-02-17 MED ORDER — PREDNISONE 50 MG PO TABS
60.0000 mg | ORAL_TABLET | Freq: Once | ORAL | Status: AC
  Administered 2012-02-18: 60 mg via ORAL
  Filled 2012-02-17: qty 1

## 2012-02-17 MED ORDER — ALBUTEROL SULFATE (5 MG/ML) 0.5% IN NEBU
5.0000 mg | INHALATION_SOLUTION | Freq: Once | RESPIRATORY_TRACT | Status: AC
  Administered 2012-02-17: 5 mg via RESPIRATORY_TRACT
  Filled 2012-02-17: qty 1

## 2012-02-17 MED ORDER — IPRATROPIUM BROMIDE 0.02 % IN SOLN
RESPIRATORY_TRACT | Status: AC
  Administered 2012-02-17: 0.5 mg
  Filled 2012-02-17: qty 2.5

## 2012-02-17 MED ORDER — ALBUTEROL SULFATE (5 MG/ML) 0.5% IN NEBU
INHALATION_SOLUTION | RESPIRATORY_TRACT | Status: AC
  Administered 2012-02-17: 5 mg
  Filled 2012-02-17: qty 1

## 2012-02-17 MED ORDER — ONDANSETRON 4 MG PO TBDP
4.0000 mg | ORAL_TABLET | Freq: Once | ORAL | Status: AC
  Administered 2012-02-18: 4 mg via ORAL
  Filled 2012-02-17: qty 1

## 2012-02-17 MED ORDER — CLONIDINE HCL 0.1 MG PO TABS
0.1000 mg | ORAL_TABLET | Freq: Once | ORAL | Status: AC
  Administered 2012-02-18: 0.1 mg via ORAL
  Filled 2012-02-17: qty 1

## 2012-02-17 MED ORDER — DOXYCYCLINE HYCLATE 100 MG PO TABS
100.0000 mg | ORAL_TABLET | Freq: Once | ORAL | Status: AC
  Administered 2012-02-18: 100 mg via ORAL
  Filled 2012-02-17: qty 1

## 2012-02-17 MED ORDER — LORAZEPAM 2 MG/ML IJ SOLN
1.0000 mg | Freq: Once | INTRAMUSCULAR | Status: AC
  Administered 2012-02-18: 1 mg via INTRAVENOUS
  Filled 2012-02-17: qty 1

## 2012-02-17 MED ORDER — SODIUM CHLORIDE 0.9 % IV BOLUS (SEPSIS)
500.0000 mL | Freq: Once | INTRAVENOUS | Status: AC
  Administered 2012-02-17: 500 mL via INTRAVENOUS

## 2012-02-17 NOTE — ED Notes (Signed)
Pat Rt in room to assist pt with SOB

## 2012-02-17 NOTE — ED Notes (Signed)
Sob all day, On 02 most of the time at 4 L

## 2012-02-17 NOTE — ED Notes (Signed)
Pt's sister states "He has been drinking a whole lot lately and he hasn't drank anything today because he is trying to quit, I think that is why he is shaking".  EDP made aware.

## 2012-02-17 NOTE — ED Provider Notes (Signed)
History     CSN: [id number]  Arrival date & time 02/17/12  2151   First MD Initiated Contact with Patient 02/17/12 2201      Chief Complaint  Patient presents with  . Shortness of Breath    (Consider location/radiation/quality/duration/timing/severity/associated sxs/prior treatment) HPI Jacob Pace is a 62 y.o. male  With a h/o COPD who continues to smoke on 4L O2 per [place] at home, alcohol abuse,who presents to the Emergency Department complaining of shortness of breath that increased today. Per his sister-in-law, he is a heavy drinker and just gave up drinking yesterday. He has been complaining of mild nausea and shortness of breath all day. He has used his nebulizer without relief.   Pulmonologist  Dr. Shelle Iron Past Medical History  Diagnosis Date  . COPD (chronic obstructive pulmonary disease)   . Emphysema     Past Surgical History  Procedure Date  . Right inguinal hernia repair 2007    Family History  Problem Relation Age of Onset  . Cancer Father     unsure what type  . Breast cancer Sister     History  Substance Use Topics  . Smoking status: Current Every Day Smoker -- 0.5 packs/day for 40 years    Types: Cigarettes  . Smokeless tobacco: Not on file  . Alcohol Use: Yes      Review of Systems  Constitutional: Negative for fever.       10 Systems reviewed and are negative for acute change except as noted in the HPI.  HENT: Negative for congestion.   Eyes: Negative for discharge and redness.  Respiratory: Positive for shortness of breath and wheezing. Negative for cough.   Cardiovascular: Negative for chest pain.  Gastrointestinal: Negative for vomiting and abdominal pain.  Musculoskeletal: Negative for back pain.  Skin: Negative for rash.  Neurological: Positive for weakness. Negative for syncope, numbness and headaches.  Psychiatric/Behavioral:       No behavior change.    Allergies  Review of patient's allergies indicates no known  allergies.  Home Medications   Current Outpatient Rx  Name  Route  Sig  Dispense  Refill  . ALBUTEROL SULFATE HFA 108 (90 BASE) MCG/ACT IN AERS   Inhalation   Inhale 2 puffs into the lungs every 4 (four) hours as needed. For shortness of breath         . BUDESONIDE-FORMOTEROL FUMARATE 160-4.5 MCG/ACT IN AERO   Inhalation   Inhale 2 puffs into the lungs 2 (two) times daily.           Marland Kitchen PRESCRIPTION MEDICATION   Oral   Take 1 tablet by mouth daily. BLOOD PRESSURE MEDICATION         . PRESCRIPTION MEDICATION   Oral   Take 1 tablet by mouth daily. CHOLESTEROL MEDICATION         . TIOTROPIUM BROMIDE MONOHYDRATE 18 MCG IN CAPS   Inhalation   Place 18 mcg into inhaler and inhale daily.             BP 106/81  Pulse 113  Temp 98.7 F (37.1 C)  Resp 22  Ht 6\' 2"  (1.88 m)  Wt 205 lb (92.987 kg)  BMI 26.32 kg/m2  SpO2 96%  Physical Exam  Nursing note and vitals reviewed. Constitutional:       Awake, alert, tremulous  HENT:  Head: Normocephalic and atraumatic.  Right Ear: External ear normal.  Left Ear: External ear normal.  Mouth/Throat: Oropharynx is clear and moist.  Eyes: EOM are normal. Pupils are equal, round, and reactive to light. Right eye exhibits no discharge. Left eye exhibits no discharge.  Neck: Neck supple.  Cardiovascular: Normal heart sounds.   Pulmonary/Chest: Effort normal. He has wheezes. He exhibits no tenderness.       Prolonged expirations, fine crackles at both bases, end expiratory wheezing bilaterally.  Abdominal: Soft. There is no tenderness. There is no rebound.  Musculoskeletal: He exhibits no tenderness.       Baseline ROM, no obvious new focal weakness.  Neurological:       Mental status and motor strength appears baseline for patient and situation.  Skin: No rash noted.  Psychiatric: He has a normal mood and affect.    ED Course  Procedures (including critical care time)  Labs Reviewed - No data to display Dg Chest [facility] 1  View  02/17/2012  *RADIOLOGY REPORT*  Clinical Data: Severe dyspnea.  PORTABLE CHEST - 1 VIEW  Comparison: 03/30/2007  Findings: Normal heart size.  Prominent hilar vascularity suggesting pulmonary arterial hypertension.  No vascular congestion.  Emphysematous changes and scattered fibrosis in the lungs.  No focal consolidation.  No pneumothorax.  No blunting of costophrenic angles.  Stable appearance of the chest since previous study.  IMPRESSION: Emphysematous changes and scattered fibrosis in the lungs with prominent hilar shadows suggesting pulmonary arterial hypertension. No evidence of active disease.  No significant change.   Original Report Authenticated By: Burman Nieves, M.D.          MDM  Patient with COPD who continues to smoke, on home O2 at 4L [place], here with increased shortness of breath today. He also quit drinking yesterday and this is day one off alcohol. He was originally tremulous. Given IVF, clonidine and ativan along with nebulizer treatments x 2. Wheezing resolved. Subjectively patient felt better. HR improved, BP stabilized, tremor improved, nausea resolved. Discussed alcohol withdrawal symptoms. Encouraged continued abstinence. Pt stable in ED with no significant deterioration in condition.The patient appears reasonably screened and/or stabilized for discharge and I doubt any other medical condition or other [facility] requiring further screening, evaluation, or treatment in the ED at this time prior to discharge.  MDM Reviewed: nursing note and vitals Interpretation: x-ray           Nicoletta Dress. Colon Branch, MD 02/18/12 [phone]

## 2012-02-18 MED ORDER — PREDNISONE 10 MG PO TABS: 20.0000 mg | ORAL_TABLET | Freq: Every day | ORAL | Status: DC

## 2012-02-18 MED ORDER — ONDANSETRON HCL 4 MG PO TABS: 4.0000 mg | ORAL_TABLET | Freq: Four times a day (QID) | ORAL | Status: DC

## 2012-02-18 MED ORDER — DOXYCYCLINE HYCLATE 100 MG PO CAPS: 100.0000 mg | ORAL_CAPSULE | Freq: Two times a day (BID) | ORAL | Status: DC

## 2012-02-18 MED ORDER — LORAZEPAM 1 MG PO TABS: ORAL_TABLET | ORAL | Status: DC

## 2012-02-18 NOTE — ED Notes (Signed)
Discharge instructions reviewed with pt, questions answered. Pt verbalized understanding.  

## 2012-10-09 ENCOUNTER — Emergency Department (HOSPITAL_COMMUNITY)
Admission: EM | Admit: 2012-10-09 | Discharge: 2012-10-09 | Disposition: A | Discharge status: Home / Self Care | Payer: Medicare Other | Attending: Emergency Medicine | Admitting: Emergency Medicine

## 2012-10-09 ENCOUNTER — Emergency Department (HOSPITAL_COMMUNITY): Payer: Medicare Other

## 2012-10-09 ENCOUNTER — Encounter (HOSPITAL_COMMUNITY): Payer: Self-pay | Admitting: *Deleted

## 2012-10-09 DIAGNOSIS — F17200 Nicotine dependence, unspecified, uncomplicated: Secondary | ICD-10-CM | POA: Insufficient documentation

## 2012-10-09 DIAGNOSIS — R059 Cough, unspecified: Secondary | ICD-10-CM | POA: Insufficient documentation

## 2012-10-09 DIAGNOSIS — R05 Cough: Secondary | ICD-10-CM | POA: Insufficient documentation

## 2012-10-09 DIAGNOSIS — J440 Chronic obstructive pulmonary disease with acute lower respiratory infection: Secondary | ICD-10-CM | POA: Insufficient documentation

## 2012-10-09 DIAGNOSIS — J40 Bronchitis, not specified as acute or chronic: Secondary | ICD-10-CM

## 2012-10-09 DIAGNOSIS — Z79899 Other long term (current) drug therapy: Secondary | ICD-10-CM | POA: Insufficient documentation

## 2012-10-09 DIAGNOSIS — J209 Acute bronchitis, unspecified: Secondary | ICD-10-CM | POA: Insufficient documentation

## 2012-10-09 DIAGNOSIS — K2970 Gastritis, unspecified, without bleeding: Secondary | ICD-10-CM

## 2012-10-09 LAB — COMPREHENSIVE METABOLIC PANEL
ALT: 123 U/L — ABNORMAL HIGH (ref 0–53)
AST: 162 U/L — ABNORMAL HIGH (ref 0–37)
Albumin: 4.1 g/dL (ref 3.5–5.2)
Alkaline Phosphatase: 83 U/L (ref 39–117)
CO2: 29 mEq/L (ref 19–32)
Chloride: 84 mEq/L — ABNORMAL LOW (ref 96–112)
GFR calc non Af Amer: >90 mL/min (ref >90)
Potassium: 3.6 mEq/L (ref 3.5–5.1)
Sodium: 127 mEq/L — ABNORMAL LOW (ref 135–145)
Total Bilirubin: 0.6 mg/dL (ref 0.3–1.2)

## 2012-10-09 LAB — CBC WITH DIFFERENTIAL/PLATELET
Basophils Absolute: 0 10*3/uL (ref 0.0–0.1)
HCT: 47 % (ref 39.0–52.0)
Lymphocytes Relative: 30 % (ref 12–46)
Monocytes Absolute: 0.6 10*3/uL (ref 0.1–1.0)
Neutro Abs: 4.2 10*3/uL (ref 1.7–7.7)
Neutrophils Relative %: 59 % (ref 43–77)
Platelets: 175 10*3/uL (ref 150–400)
RDW: 14.1 % (ref 11.5–15.5)
WBC: 7.1 10*3/uL (ref 4.0–10.5)

## 2012-10-09 LAB — TROPONIN I: Troponin I: <0.3 ng/mL (ref <0.30)

## 2012-10-09 LAB — PRO B NATRIURETIC PEPTIDE: Pro B Natriuretic peptide (BNP): 646.3 pg/mL — ABNORMAL HIGH (ref 0–125)

## 2012-10-09 MED ORDER — PANTOPRAZOLE SODIUM 40 MG IV SOLR
40.0000 mg | Freq: Once | INTRAVENOUS | Status: AC
  Administered 2012-10-09: 40 mg via INTRAVENOUS
  Filled 2012-10-09: qty 40

## 2012-10-09 MED ORDER — PANTOPRAZOLE SODIUM 20 MG PO TBEC: 20.0000 mg | DELAYED_RELEASE_TABLET | Freq: Every day | ORAL | Status: DC

## 2012-10-09 MED ORDER — PROMETHAZINE HCL 25 MG PO TABS: 25.0000 mg | ORAL_TABLET | Freq: Four times a day (QID) | ORAL | Status: DC | PRN

## 2012-10-09 MED ORDER — AMOXICILLIN 500 MG PO CAPS: 500.0000 mg | ORAL_CAPSULE | Freq: Three times a day (TID) | ORAL | Status: DC

## 2012-10-09 NOTE — ED Notes (Signed)
Left in c/o family for transport home; instructions, prescriptions and f/u information given/reviewed - verbalizes understanding.  

## 2012-10-09 NOTE — ED Notes (Signed)
C/o n/v x 1 week. Denies pain, denies shortness of breath.  Hx of ETOH abuse - last drink this morning.  Pt wears O2 4L/min per [place] 24/7.

## 2012-10-09 NOTE — ED Provider Notes (Signed)
CSN: [id number]     Arrival date & time 10/09/12  1249 History   First MD Initiated Contact with Patient 10/09/12 1348     Chief Complaint  Patient presents with  . Emesis  . Shortness of Breath   (Consider location/radiation/quality/duration/timing/severity/associated sxs/prior Treatment) Patient is a 62 y.o. male presenting with vomiting and shortness of breath. The history is provided by the patient (the pt complains of coughing and vomiting).  Emesis Severity:  Mild Timing:  Intermittent Quality:  Undigested food Able to tolerate:  Liquids Progression:  Unchanged Associated symptoms: no abdominal pain, no diarrhea and no headaches   Shortness of Breath Associated symptoms: cough and vomiting   Associated symptoms: no abdominal pain, no chest pain, no headaches and no rash     Past Medical History  Diagnosis Date  . COPD (chronic obstructive pulmonary disease)   . Emphysema    Past Surgical History  Procedure Laterality Date  . Right inguinal hernia repair  2007   Family History  Problem Relation Age of Onset  . Cancer Father     unsure what type  . Breast cancer Sister    History  Substance Use Topics  . Smoking status: Current Every Day Smoker -- 0.50 packs/day for 40 years    Types: Cigarettes  . Smokeless tobacco: Not on file  . Alcohol Use: Yes     Comment: drinks a 6 pack a day per pt    Review of Systems  Constitutional: Negative for appetite change and fatigue.  HENT: Negative for congestion, sinus pressure and ear discharge.   Eyes: Negative for discharge.  Respiratory: Positive for cough.   Cardiovascular: Negative for chest pain.  Gastrointestinal: Positive for vomiting. Negative for abdominal pain and diarrhea.  Genitourinary: Negative for frequency and hematuria.  Musculoskeletal: Negative for back pain.  Skin: Negative for rash.  Neurological: Negative for seizures and headaches.  Psychiatric/Behavioral: Negative for hallucinations.     Allergies  Review of patient's allergies indicates no known allergies.  Home Medications   Current Outpatient Rx  Name  Route  Sig  Dispense  Refill  . budesonide-formoterol (SYMBICORT) 160-4.5 MCG/ACT inhaler   Inhalation   Inhale 2 puffs into the lungs 2 (two) times daily.           Marland Kitchen tiotropium (SPIRIVA) 18 MCG inhalation capsule   Inhalation   Place 18 mcg into inhaler and inhale daily.           Marland Kitchen amoxicillin (AMOXIL) 500 MG capsule   Oral   Take 1 capsule (500 mg total) by mouth 3 (three) times daily.   21 capsule   0   . pantoprazole (PROTONIX) 20 MG tablet   Oral   Take 1 tablet (20 mg total) by mouth daily.   30 tablet   1   . promethazine (PHENERGAN) 25 MG tablet   Oral   Take 1 tablet (25 mg total) by mouth every 6 (six) hours as needed for nausea.   15 tablet   0    BP 116/92  Pulse 96  Temp(Src) 97.7 F (36.5 C) (Oral)  Resp 24  Ht 6\' 2"  (1.88 m)  Wt 205 lb (92.987 kg)  BMI 26.31 kg/m2  SpO2 100% Physical Exam  Constitutional: He is oriented to person, place, and time. He appears well-developed.  HENT:  Head: Normocephalic.  Eyes: Conjunctivae and EOM are normal. No scleral icterus.  Neck: Neck supple. No thyromegaly present.  Cardiovascular: Normal rate and regular rhythm.  Exam reveals no gallop and no friction rub.   No murmur heard. Pulmonary/Chest: No stridor. He has no wheezes. He has no rales. He exhibits no tenderness.  Abdominal: He exhibits no distension. There is no tenderness. There is no rebound.  Musculoskeletal: Normal range of motion. He exhibits no edema.  Lymphadenopathy:    He has no cervical adenopathy.  Neurological: He is oriented to person, place, and time. Coordination normal.  Skin: No rash noted. No erythema.  Psychiatric: He has a normal mood and affect. His behavior is normal.    ED Course  Procedures (including critical care time) Labs Review Labs Reviewed  PRO B NATRIURETIC PEPTIDE - Abnormal;  Notable for the following:    Pro B Natriuretic peptide (BNP) 646.3 (*)    All other components within normal limits  COMPREHENSIVE METABOLIC PANEL - Abnormal; Notable for the following:    Sodium 127 (*)    Chloride 84 (*)    Glucose, Bld 109 (*)    BUN 3 (*)    Total Protein 8.5 (*)    AST 162 (*)    ALT 123 (*)    All other components within normal limits  ETHANOL - Abnormal; Notable for the following:    Alcohol, Ethyl (B) 273 (*)    All other components within normal limits  TROPONIN I  CBC WITH DIFFERENTIAL   Imaging Review Dg Chest 2 View  10/09/2012   *RADIOLOGY REPORT*  Clinical Data: Shortness of breath  CHEST - 2 VIEW  Comparison: 02/17/2012  Findings: Cardiomediastinal silhouette is stable.  Hyperinflation again noted.  Bilateral hilar prominence suggesting pulmonary arterial hypertension again noted.  Stable scarring and probable chronic interstitial prominence.  Old fracture of the left eighth rib.  Emphysematous bullous changes especially left lung medially again noted. No acute infiltrate or pulmonary edema.  IMPRESSION: . No active disease.  Stable chronic findings as described above.   Original Report Authenticated By: Natasha Mead, M.D.    Date: 10/09/2012  Rate 90  Rhythm: normal sinus rhythm  QRS Axis: normal  Intervals: normal  ST/T Wave abnormalities: normal  Conduction Disutrbances:none  Narrative Interpretation:   Old EKG Reviewed: none available   MDM   1. Bronchitis   2. Gastritis        Benny Lennert, MD 10/09/12 [phone]

## 2012-10-09 NOTE — ED Notes (Signed)
Pt brought to er by family with c/o n/v, decreased appetite, drinking alcohol, sob, cough that is non productive for the past week, family member reports that whatever pt attempts to eat he throws back up. Pt alert, denies any pain,

## 2013-02-05 ENCOUNTER — Emergency Department (HOSPITAL_COMMUNITY): Payer: Medicare Other

## 2013-02-05 ENCOUNTER — Encounter (HOSPITAL_COMMUNITY): Payer: Self-pay | Admitting: Emergency Medicine

## 2013-02-05 ENCOUNTER — Inpatient Hospital Stay (HOSPITAL_COMMUNITY)
Admission: EM | Admit: 2013-02-05 | Discharge: 2013-02-09 | DRG: 190 | Disposition: A | Discharge status: Home Health | Payer: Medicare Other | Attending: Internal Medicine | Admitting: Internal Medicine

## 2013-02-05 ENCOUNTER — Other Ambulatory Visit: Payer: Self-pay

## 2013-02-05 DIAGNOSIS — I498 Other specified cardiac arrhythmias: Secondary | ICD-10-CM

## 2013-02-05 DIAGNOSIS — R930 Abnormal findings on diagnostic imaging of skull and head, not elsewhere classified: Secondary | ICD-10-CM

## 2013-02-05 DIAGNOSIS — Z803 Family history of malignant neoplasm of breast: Secondary | ICD-10-CM

## 2013-02-05 DIAGNOSIS — J9621 Acute and chronic respiratory failure with hypoxia: Secondary | ICD-10-CM | POA: Diagnosis present

## 2013-02-05 DIAGNOSIS — J449 Chronic obstructive pulmonary disease, unspecified: Secondary | ICD-10-CM | POA: Diagnosis present

## 2013-02-05 DIAGNOSIS — D72829 Elevated white blood cell count, unspecified: Secondary | ICD-10-CM | POA: Diagnosis present

## 2013-02-05 DIAGNOSIS — J438 Other emphysema: Secondary | ICD-10-CM

## 2013-02-05 DIAGNOSIS — J9620 Acute and chronic respiratory failure, unspecified whether with hypoxia or hypercapnia: Secondary | ICD-10-CM | POA: Diagnosis present

## 2013-02-05 DIAGNOSIS — K2990 Gastroduodenitis, unspecified, without bleeding: Secondary | ICD-10-CM

## 2013-02-05 DIAGNOSIS — I471 Supraventricular tachycardia, unspecified: Secondary | ICD-10-CM | POA: Diagnosis present

## 2013-02-05 DIAGNOSIS — F17200 Nicotine dependence, unspecified, uncomplicated: Secondary | ICD-10-CM | POA: Diagnosis present

## 2013-02-05 DIAGNOSIS — J209 Acute bronchitis, unspecified: Secondary | ICD-10-CM | POA: Diagnosis present

## 2013-02-05 DIAGNOSIS — R079 Chest pain, unspecified: Secondary | ICD-10-CM

## 2013-02-05 DIAGNOSIS — R933 Abnormal findings on diagnostic imaging of other parts of digestive tract: Secondary | ICD-10-CM | POA: Diagnosis present

## 2013-02-05 DIAGNOSIS — J9622 Acute and chronic respiratory failure with hypercapnia: Secondary | ICD-10-CM | POA: Diagnosis present

## 2013-02-05 DIAGNOSIS — E7800 Pure hypercholesterolemia, unspecified: Secondary | ICD-10-CM | POA: Diagnosis present

## 2013-02-05 DIAGNOSIS — Z9981 Dependence on supplemental oxygen: Secondary | ICD-10-CM

## 2013-02-05 DIAGNOSIS — I10 Essential (primary) hypertension: Secondary | ICD-10-CM | POA: Diagnosis present

## 2013-02-05 DIAGNOSIS — Z8249 Family history of ischemic heart disease and other diseases of the circulatory system: Secondary | ICD-10-CM

## 2013-02-05 DIAGNOSIS — K2970 Gastritis, unspecified, without bleeding: Secondary | ICD-10-CM

## 2013-02-05 DIAGNOSIS — J441 Chronic obstructive pulmonary disease with (acute) exacerbation: Principal | ICD-10-CM

## 2013-02-05 HISTORY — DX: Essential (primary) hypertension: I10

## 2013-02-05 LAB — COMPREHENSIVE METABOLIC PANEL
ALBUMIN: 3.3 g/dL — AB (ref 3.5–5.2)
ALK PHOS: 68 U/L (ref 39–117)
ALT: 15 U/L (ref 0–53)
AST: 34 U/L (ref 0–37)
BILIRUBIN TOTAL: 0.1 mg/dL — AB (ref 0.3–1.2)
BUN: 7 mg/dL (ref 6–23)
CHLORIDE: 97 meq/L (ref 96–112)
CO2: 29 mEq/L (ref 19–32)
Calcium: 8.8 mg/dL (ref 8.4–10.5)
Creatinine, Ser: 0.73 mg/dL (ref 0.50–1.35)
GFR calc Af Amer: >90 mL/min (ref >90)
GFR calc non Af Amer: >90 mL/min (ref >90)
Glucose, Bld: 103 mg/dL — ABNORMAL HIGH (ref 70–99)
POTASSIUM: 4.2 meq/L (ref 3.7–5.3)
Sodium: 138 mEq/L (ref 137–147)
Total Protein: 7.6 g/dL (ref 6.0–8.3)

## 2013-02-05 LAB — BLOOD GAS, ARTERIAL
ACID-BASE EXCESS: 2.1 mmol/L — AB (ref 0.0–2.0)
Acid-base deficit: 3.2 mmol/L — ABNORMAL HIGH (ref 0.0–2.0)
Bicarbonate: 21.3 mEq/L (ref 20.0–24.0)
Bicarbonate: 26.2 mEq/L — ABNORMAL HIGH (ref 20.0–24.0)
DRAWN BY: 317771
O2 CONTENT: 6 L/min
O2 Content: 4 L/min
O2 SAT: 95.6 %
O2 Saturation: 94.5 %
PCO2 ART: 40.6 mmHg (ref 35.0–45.0)
Patient temperature: 37
Patient temperature: 37
TCO2: 18.9 mmol/L (ref 0–100)
TCO2: 22.6 mmol/L (ref 0–100)
pCO2 arterial: 38.5 mmHg (ref 35.0–45.0)
pH, Arterial: 7.362 (ref 7.350–7.450)
pH, Arterial: 7.425 (ref 7.350–7.450)
pO2, Arterial: 80.6 mmHg (ref 80.0–100.0)
pO2, Arterial: 81.1 mmHg (ref 80.0–100.0)

## 2013-02-05 LAB — CREATININE, SERUM
CREATININE: 0.62 mg/dL (ref 0.50–1.35)
GFR calc Af Amer: >90 mL/min (ref >90)
GFR calc non Af Amer: >90 mL/min (ref >90)

## 2013-02-05 LAB — CBC WITH DIFFERENTIAL/PLATELET
Basophils Absolute: 0 10*3/uL (ref 0.0–0.1)
Basophils Relative: 0 % (ref 0–1)
Eosinophils Absolute: 0.2 10*3/uL (ref 0.0–0.7)
Eosinophils Relative: 1 % (ref 0–5)
HCT: 47.6 % (ref 39.0–52.0)
Hemoglobin: 15.3 g/dL (ref 13.0–17.0)
Lymphocytes Relative: 22 % (ref 12–46)
Lymphs Abs: 3 10*3/uL (ref 0.7–4.0)
MCH: 29.6 pg (ref 26.0–34.0)
MCHC: 32.1 g/dL (ref 30.0–36.0)
MCV: 92.1 fL (ref 78.0–100.0)
Monocytes Absolute: 0.7 10*3/uL (ref 0.1–1.0)
Monocytes Relative: 5 % (ref 3–12)
Neutro Abs: 9.7 10*3/uL — ABNORMAL HIGH (ref 1.7–7.7)
Neutrophils Relative %: 71 % (ref 43–77)
Platelets: 369 10*3/uL (ref 150–400)
RBC: 5.17 MIL/uL (ref 4.22–5.81)
RDW: 16.3 % — ABNORMAL HIGH (ref 11.5–15.5)
WBC: 13.6 10*3/uL — ABNORMAL HIGH (ref 4.0–10.5)

## 2013-02-05 LAB — PROTIME-INR
INR: 1.08 (ref 0.00–1.49)
PROTHROMBIN TIME: 13.8 s (ref 11.6–15.2)

## 2013-02-05 LAB — PRO B NATRIURETIC PEPTIDE: Pro B Natriuretic peptide (BNP): 2755 pg/mL — ABNORMAL HIGH (ref 0–125)

## 2013-02-05 LAB — D-DIMER, QUANTITATIVE: D-Dimer, Quant: 1.16 ug/mL-FEU — ABNORMAL HIGH (ref 0.00–0.48)

## 2013-02-05 LAB — TROPONIN I

## 2013-02-05 MED ORDER — ASPIRIN EC 81 MG PO TBEC
81.0000 mg | DELAYED_RELEASE_TABLET | Freq: Every day | ORAL | Status: DC
Start: 2013-02-05 — End: 2013-02-05

## 2013-02-05 MED ORDER — SODIUM CHLORIDE 0.9 % IJ SOLN
3.0000 mL | Freq: Two times a day (BID) | INTRAMUSCULAR | Status: DC
Start: 2013-02-05 — End: 2013-02-09
  Administered 2013-02-05 – 2013-02-08 (×7): 3 mL via INTRAVENOUS

## 2013-02-05 MED ORDER — IPRATROPIUM BROMIDE 0.02 % IN SOLN
0.5000 mg | Freq: Once | RESPIRATORY_TRACT | Status: AC
  Administered 2013-02-05: 0.5 mg via RESPIRATORY_TRACT
  Filled 2013-02-05: qty 2.5

## 2013-02-05 MED ORDER — ASPIRIN 81 MG PO CHEW
81.0000 mg | CHEWABLE_TABLET | Freq: Every day | ORAL | Status: DC
  Administered 2013-02-05 – 2013-02-09 (×5): 81 mg via ORAL
  Filled 2013-02-05 (×5): qty 1

## 2013-02-05 MED ORDER — DILTIAZEM HCL 100 MG IV SOLR
5.0000 mg/h | Freq: Once | INTRAVENOUS | Status: AC
  Administered 2013-02-05: 5 mg/h via INTRAVENOUS

## 2013-02-05 MED ORDER — SODIUM CHLORIDE 0.9 % IV BOLUS (SEPSIS)
1000.0000 mL | Freq: Once | INTRAVENOUS | Status: AC
  Administered 2013-02-05: 1000 mL via INTRAVENOUS

## 2013-02-05 MED ORDER — BUDESONIDE-FORMOTEROL FUMARATE 160-4.5 MCG/ACT IN AERO
INHALATION_SPRAY | RESPIRATORY_TRACT | Status: AC
  Filled 2013-02-05: qty 6

## 2013-02-05 MED ORDER — ONDANSETRON HCL 4 MG/2ML IJ SOLN
4.0000 mg | Freq: Four times a day (QID) | INTRAMUSCULAR | Status: DC | PRN
  Administered 2013-02-06: 4 mg via INTRAVENOUS
  Filled 2013-02-05: qty 2

## 2013-02-05 MED ORDER — SODIUM CHLORIDE 0.9 % IJ SOLN
INTRAMUSCULAR | Status: AC
  Filled 2013-02-05: qty 750

## 2013-02-05 MED ORDER — DILTIAZEM HCL ER 90 MG PO CP12
90.0000 mg | ORAL_CAPSULE | Freq: Two times a day (BID) | ORAL | Status: DC
  Administered 2013-02-05 – 2013-02-08 (×6): 90 mg via ORAL
  Filled 2013-02-05 (×12): qty 1

## 2013-02-05 MED ORDER — POLYETHYLENE GLYCOL 3350 17 G PO PACK: 17.0000 g | PACK | Freq: Every day | ORAL | Status: DC | PRN

## 2013-02-05 MED ORDER — GUAIFENESIN-DM 100-10 MG/5ML PO SYRP: 5.0000 mL | ORAL_SOLUTION | ORAL | Status: DC | PRN

## 2013-02-05 MED ORDER — ALBUTEROL SULFATE (2.5 MG/3ML) 0.083% IN NEBU: 5.0000 mg | INHALATION_SOLUTION | RESPIRATORY_TRACT | Status: DC | PRN

## 2013-02-05 MED ORDER — FUROSEMIDE 10 MG/ML IJ SOLN
20.0000 mg | Freq: Once | INTRAMUSCULAR | Status: AC
  Administered 2013-02-05: 20 mg via INTRAVENOUS
  Filled 2013-02-05: qty 2

## 2013-02-05 MED ORDER — OXYCODONE-ACETAMINOPHEN 5-325 MG PO TABS
1.0000 | ORAL_TABLET | Freq: Once | ORAL | Status: AC
  Administered 2013-02-05: 1 via ORAL
  Filled 2013-02-05: qty 1

## 2013-02-05 MED ORDER — NITROGLYCERIN 2 % TD OINT
0.5000 [in_us] | TOPICAL_OINTMENT | Freq: Four times a day (QID) | TRANSDERMAL | Status: DC
  Administered 2013-02-05 – 2013-02-06 (×4): 0.5 [in_us] via TOPICAL
  Filled 2013-02-05 (×4): qty 1

## 2013-02-05 MED ORDER — LEVALBUTEROL HCL 0.63 MG/3ML IN NEBU
0.6300 mg | INHALATION_SOLUTION | RESPIRATORY_TRACT | Status: DC
  Administered 2013-02-05 – 2013-02-09 (×22): 0.63 mg via RESPIRATORY_TRACT
  Filled 2013-02-05 (×22): qty 3

## 2013-02-05 MED ORDER — ADENOSINE 6 MG/2ML IV SOLN
INTRAVENOUS | Status: AC
  Administered 2013-02-05: 12:00:00 6 mg via INTRAVENOUS
  Filled 2013-02-05: qty 6

## 2013-02-05 MED ORDER — LEVOFLOXACIN 500 MG PO TABS
ORAL_TABLET | ORAL | Status: AC
  Administered 2013-02-05: 500 mg
  Filled 2013-02-05: qty 1

## 2013-02-05 MED ORDER — DILTIAZEM HCL 25 MG/5ML IV SOLN
INTRAVENOUS | Status: AC
  Filled 2013-02-05: qty 5

## 2013-02-05 MED ORDER — ONDANSETRON HCL 4 MG PO TABS: 4.0000 mg | ORAL_TABLET | Freq: Four times a day (QID) | ORAL | Status: DC | PRN

## 2013-02-05 MED ORDER — HYDROCODONE-ACETAMINOPHEN 5-325 MG PO TABS
1.0000 | ORAL_TABLET | ORAL | Status: DC | PRN
  Filled 2013-02-05: qty 2

## 2013-02-05 MED ORDER — ADENOSINE 6 MG/2ML IV SOLN
6.0000 mg | Freq: Once | INTRAVENOUS | Status: AC
  Administered 2013-02-05: 6 mg via INTRAVENOUS

## 2013-02-05 MED ORDER — IOHEXOL 350 MG/ML SOLN
100.0000 mL | Freq: Once | INTRAVENOUS | Status: AC | PRN
  Administered 2013-02-05: 100 mL via INTRAVENOUS

## 2013-02-05 MED ORDER — SODIUM CHLORIDE 0.9 % IV SOLN
INTRAVENOUS | Status: AC
  Administered 2013-02-05: 1000 mL
  Filled 2013-02-05: qty 750

## 2013-02-05 MED ORDER — ALBUTEROL SULFATE (2.5 MG/3ML) 0.083% IN NEBU
5.0000 mg | INHALATION_SOLUTION | Freq: Once | RESPIRATORY_TRACT | Status: AC
  Administered 2013-02-05: 5 mg via RESPIRATORY_TRACT
  Filled 2013-02-05: qty 6

## 2013-02-05 MED ORDER — DILTIAZEM HCL 25 MG/5ML IV SOLN
10.0000 mg | Freq: Once | INTRAVENOUS | Status: AC
  Administered 2013-02-05: 10 mg via INTRAVENOUS
  Filled 2013-02-05: qty 5

## 2013-02-05 MED ORDER — DILTIAZEM HCL 60 MG PO TABS: 90.0000 mg | ORAL_TABLET | Freq: Two times a day (BID) | ORAL | Status: DC

## 2013-02-05 MED ORDER — LEVALBUTEROL HCL 0.63 MG/3ML IN NEBU
0.6300 mg | INHALATION_SOLUTION | Freq: Four times a day (QID) | RESPIRATORY_TRACT | Status: DC
  Administered 2013-02-05: 0.63 mg via RESPIRATORY_TRACT
  Filled 2013-02-05: qty 3

## 2013-02-05 MED ORDER — BUDESONIDE-FORMOTEROL FUMARATE 160-4.5 MCG/ACT IN AERO
2.0000 | INHALATION_SPRAY | Freq: Two times a day (BID) | RESPIRATORY_TRACT | Status: DC
  Administered 2013-02-05 – 2013-02-09 (×8): 2 via RESPIRATORY_TRACT
  Filled 2013-02-05: qty 6

## 2013-02-05 MED ORDER — ACETAMINOPHEN 325 MG PO TABS
650.0000 mg | ORAL_TABLET | Freq: Four times a day (QID) | ORAL | Status: DC | PRN
  Administered 2013-02-06: 650 mg via ORAL
  Filled 2013-02-05: qty 2

## 2013-02-05 MED ORDER — ALUM & MAG HYDROXIDE-SIMETH 200-200-20 MG/5ML PO SUSP
30.0000 mL | Freq: Four times a day (QID) | ORAL | Status: DC | PRN
  Administered 2013-02-07 – 2013-02-09 (×3): 30 mL via ORAL
  Filled 2013-02-05 (×3): qty 30

## 2013-02-05 MED ORDER — METHYLPREDNISOLONE SODIUM SUCC 125 MG IJ SOLR
125.0000 mg | Freq: Once | INTRAMUSCULAR | Status: DC
  Administered 2013-02-05: 125 mg via INTRAVENOUS
  Filled 2013-02-05: qty 2

## 2013-02-05 MED ORDER — ENOXAPARIN SODIUM 40 MG/0.4ML ~~LOC~~ SOLN
40.0000 mg | SUBCUTANEOUS | Status: DC
  Administered 2013-02-05 – 2013-02-08 (×4): 40 mg via SUBCUTANEOUS
  Filled 2013-02-05 (×4): qty 0.4

## 2013-02-05 MED ORDER — ACETAMINOPHEN 650 MG RE SUPP
650.0000 mg | Freq: Four times a day (QID) | RECTAL | Status: DC | PRN
Start: 2013-02-05 — End: 2013-02-09

## 2013-02-05 MED ORDER — LEVOFLOXACIN 500 MG PO TABS
500.0000 mg | ORAL_TABLET | Freq: Every day | ORAL | Status: DC
  Administered 2013-02-06 – 2013-02-09 (×4): 500 mg via ORAL
  Filled 2013-02-05 (×4): qty 1

## 2013-02-05 MED ORDER — PNEUMOCOCCAL VAC POLYVALENT 25 MCG/0.5ML IJ INJ
0.5000 mL | INJECTION | INTRAMUSCULAR | Status: AC
  Administered 2013-02-06: 0.5 mL via INTRAMUSCULAR
  Filled 2013-02-05: qty 0.5

## 2013-02-05 MED ORDER — METHYLPREDNISOLONE SODIUM SUCC 125 MG IJ SOLR
80.0000 mg | Freq: Four times a day (QID) | INTRAMUSCULAR | Status: DC
  Administered 2013-02-05 – 2013-02-08 (×12): 80 mg via INTRAVENOUS
  Filled 2013-02-05 (×13): qty 2

## 2013-02-05 NOTE — ED Notes (Signed)
Pt refusing to finish breathing tx, requesting his oxygen to be placed back on. Oxygen placed back on pt per his request.

## 2013-02-05 NOTE — ED Notes (Signed)
Pt c/o sob for the past few days, right side chest pain that radiates across chest area that started this am, hr 150,. Dr. Estell HarpinZammit notified and at bedside for evaluation,

## 2013-02-05 NOTE — ED Notes (Signed)
Pt reports beign sick w/ cough and congestion for 1 week, today woke w/ increased sob and cp that starts on right side of chest and moves to his left. Denies any fever or nausea. +smoker, uses 02 4l around the clock

## 2013-02-05 NOTE — ED Notes (Signed)
Pt converted from sinus tach/svt with rate of 145-160's with 10 mg Cardizem to NSR rate of mid 80's, pt states that he is feeling better, Dr. Estell HarpinZammit at bedside during administration of cardizem

## 2013-02-05 NOTE — Progress Notes (Signed)
Patient arrived to floor at 1830. Alert and oriented. Labored breathing. Oxygen on at  4 liters by [place]. Dr. Thedore MinsSingh notified of patients distress. Oxygen level 96% at this time. Oxygen level increased to 6 liters by [place] as per physician order. Will continue to monitor for 1 hour then apply bipap if needed. Respiratory therapist and night shift nurse notified. After 30 minutes of oxygen at 6 liters and after breathing treatment, patients respirations are decreased to 26 a minutes and labored breathing is improved.

## 2013-02-05 NOTE — H&P (Addendum)
[facility]                                                                                    Patient Demographics  Jacob Pace, is a 63 y.o. male  MRN: [id number]   DOB - [birth year]  Admit Date - 02/05/2013  Outpatient Primary MD for the patient is No PCP Per Patient   With History of -  Past Medical History  Diagnosis Date  . COPD (chronic obstructive pulmonary disease)   . Emphysema       Past Surgical History  Procedure Laterality Date  . Right inguinal hernia repair  2007    in for   Chief Complaint  Patient presents with  . Shortness of Breath  . Chest Pain     HPI  Jacob Pace  is a 63 y.o. male, with history of advanced COPD which is predominantly emphysema on 4 L nasal cannula oxygen, goes to [facility] for his medical care, ongoing smoking abuse counseled to quit smoking, comes to the ER with 12-14 hour history of dry cough along with progressive shortness of breath, he has chronic shortness of breath but it became acutely worse in the last 12 hours, denies any fever chills or body aches, no exposure to sick contacts, no recent travel, due to increased work of breathing he was feeling some chest pressure. Came to the ER as shortness of breath was getting worse in the ER his workup was consistent with acute on chronic respiratory failure due to COPD exacerbation, he briefly went into SVT in the ER received Cardizem and converted to sinus rhythm. He underwent CT angiogram of the chest which was unremarkable. I was called to admit the patient for COPD exacerbation with brief run of SVT.  He currently is feeling much better although still short of breath, mild chest pressure which is improving, no other place except as above.     Review of Systems    In addition to the HPI above,  No Fever-chills, No Headache, No changes with Vision or hearing, No problems swallowing food or Liquids, No Chest pain, ++ Cough & Shortness of Breath, No Abdominal pain, No  Nausea or Vommitting, Bowel movements are regular, No Blood in stool or Urine, No dysuria, No new skin rashes or bruises, No new joints pains-aches,  No new weakness, tingling, numbness in any extremity, No recent weight gain or loss, No polyuria, polydypsia or polyphagia, No significant Mental Stressors.  A full 10 point Review of Systems was done, except as stated above, all other Review of Systems were negative.   Social History History  Substance Use Topics  . Smoking status: Current Every Day Smoker -- 0.50 packs/day for 40 years    Types: Cigarettes  . Smokeless tobacco: Not on file  . Alcohol Use: Yes     Comment: drinks a 6 pack a day per pt      Family History Family History  Problem Relation Age of Onset  . Cancer Father     unsure what type  . Breast cancer Sister      Prior to Admission medications   Medication Sig Start Date End  Date Taking? Authorizing Provider  albuterol (PROVENTIL HFA;VENTOLIN HFA) 108 (90 BASE) MCG/ACT inhaler Inhale 2 puffs into the lungs every 6 (six) hours as needed for wheezing or shortness of breath.   Yes Historical Provider, MD  budesonide-formoterol (SYMBICORT) 160-4.5 MCG/ACT inhaler Inhale 2 puffs into the lungs 2 (two) times daily.     Yes Historical Provider, MD    No Known Allergies  Physical Exam  Vitals  Blood pressure 133/94, pulse 87, temperature 97.7 F (36.5 C), temperature source Oral, resp. rate 24, SpO2 96.00%.   1. General middle aged AA Male lying in bed in moderate SOB  2. Normal affect and insight, Not Suicidal or Homicidal, Awake Alert, Oriented X 3.  3. No F.N deficits, ALL C.Nerves Intact, Strength 5/5 all 4 extremities, Sensation intact all 4 extremities, Plantars down going.  4. Ears and Eyes appear Normal, Conjunctivae clear, PERRLA. Moist Oral Mucosa.  5. Supple Neck, No JVD, No cervical lymphadenopathy appriciated, No Carotid Bruits.  6. Symmetrical Chest wall movement, Poor air movement  bilaterally, no wheezing  7. RRR, No Gallops, Rubs or Murmurs, No Parasternal Heave.  8. Positive Bowel Sounds, Abdomen Soft, Non tender, No organomegaly appriciated,No rebound -guarding or rigidity.  9.  No Cyanosis, Normal Skin Turgor, No Skin Rash or Bruise.  10. Good muscle tone,  joints appear normal , no effusions, Normal ROM.  11. No Palpable Lymph Nodes in Neck or Axillae     Data Review  CBC  Recent Labs Lab 02/05/13 1212  WBC 13.6*  HGB 15.3  HCT 47.6  PLT 369  MCV 92.1  MCH 29.6  MCHC 32.1  RDW 16.3*  LYMPHSABS 3.0  MONOABS 0.7  EOSABS 0.2  BASOSABS 0.0   ------------------------------------------------------------------------------------------------------------------  Chemistries   Recent Labs Lab 02/05/13 1212  NA 138  K 4.2  CL 97  CO2 29  GLUCOSE 103*  BUN 7  CREATININE 0.73  CALCIUM 8.8  AST 34  ALT 15  ALKPHOS 68  BILITOT 0.1*   ------------------------------------------------------------------------------------------------------------------ CrCl is unknown because both a height and weight (above a minimum accepted value) are required for this calculation. ------------------------------------------------------------------------------------------------------------------ No results found for this basename: TSH, T4TOTAL, FREET3, T3FREE, THYROIDAB,  in the last 72 hours   Coagulation profile No results found for this basename: INR, PROTIME,  in the last 168 hours -------------------------------------------------------------------------------------------------------------------  Recent Labs  02/05/13 1212  DDIMER 1.16*   -------------------------------------------------------------------------------------------------------------------  Cardiac Enzymes  Recent Labs Lab 02/05/13 1212  TROPONINI <0.30   ------------------------------------------------------------------------------------------------------------------ No components  found with this basename: POCBNP,    ---------------------------------------------------------------------------------------------------------------  Urinalysis No results found for this basename: colorurine, appearanceur, labspec, phurine, glucoseu, hgbur, bilirubinur, ketonesur, proteinur, urobilinogen, nitrite, leukocytesur    ----------------------------------------------------------------------------------------------------------------  Imaging results:   Ct Angio Chest Pe W/cm &/or Wo Cm  02/05/2013   CLINICAL DATA:  Shortness of breath.  EXAM: CT ANGIOGRAPHY CHEST WITH CONTRAST  TECHNIQUE: Multidetector CT imaging of the chest was performed using the standard protocol during bolus administration of intravenous contrast. Multiplanar CT image reconstructions including MIPs were obtained to evaluate the vascular anatomy.  CONTRAST:  OMNIPAQUE IOHEXOL 350 MG/ML SOLN  COMPARISON:  Chest x-ray on 02/05/2013  FINDINGS: The pulmonary arteries are well opacified. There is no evidence of pulmonary embolism. The thoracic aorta is of normal caliber. Lungs show advanced emphysematous disease with large emphysematous bullae present in both upper lung zones. There is no evidence of pulmonary consolidation, edema, mass or pneumothorax. No pleural or pericardial fluid is identified.  The heart size is normal. No enlarged lymph nodes are identified. Bony structures are unremarkable.  Review of the MIP images confirms the above findings.  IMPRESSION: No evidence of pulmonary embolism or other acute findings. Advanced emphysematous lung disease present.   Electronically Signed   By: Irish Lack M.D.   On: 02/05/2013 15:24   Dg Chest Portable 1 View  02/05/2013   CLINICAL DATA:  Cough and congestion for a few days. Sided onset is severe shortness of breath this morning. History of emphysema.  EXAM: PORTABLE CHEST - 1 VIEW  COMPARISON:  10/09/2012, 02/17/2012 and 03/30/2007 chest x-ray. 02/28/2008 chest  CT.  FINDINGS: Right apical parenchymal changes with bullae and pleural thickening similar to prior exams. No associated bony destruction.  Mild left apical pleural thickening stable.  Heart size top-normal.  Pulmonary vascular prominence most notable centrally superimposed upon chronic lung changes without findings of frank pulmonary edema.  No segmental infiltrate or gross pneumothorax.  Calcified slightly tortuous aorta.  The patient would eventually benefit from two view chest.  IMPRESSION: Mild central pulmonary vascular prominence superimposed upon chronic changes as detailed above.   Electronically Signed   By: Bridgett Larsson M.D.   On: 02/05/2013 13:02    My personal review of EKG: Rhythm SVT, Rate  150s /min,   no Acute ST changes    Assessment & Plan    1. Acute on chronic respiratory failure secondary to acute on chronic COPD exacerbation - he's end-stage COPD uses 4 L oxygen at home however he continues to smoke. His disease is predominantly emphysematous, is exam is consistent with predominantly emphysematous disease. His bilateral air movement is minimal and actually there is no wheezing, he will be admitted to telemetry bed, IV Solu-Medrol and Levaquin, scheduled and when necessary nebulizer treatments, oxygen 5-6 L as needed. He is currently improving and feeling better will be closely monitored.     2. Brief episode of SVT secondary to #1 above. He is converted to sinus rhythm on Cardizem which will be continued in oral form, due to increased work of breathing he had some chest pressure and I will place nitro paste to help him, repeat EKG has no acute changes first set of troponin is negative, will monitor serial troponin and for the time being place him on low-dose aspirin also. We'll check TSH, echogram and request cardiology to evaluate.     3. Ongoing smoking abuse. Counseled to quit smoking.      4. Mild reactionary leukocytosis due to #1 above. Clinically no signs of  pneumonia or infection. Could have mild bronchitis and and should suffice.      5. Nonspecific elevation of d-dimer. No swelling in legs, CT angiogram chest negative. D-dimer elevated likely due to the stress from #1 above and mild bronchitis.      DVT Prophylaxis   Lovenox    AM Labs Ordered, also please review Full Orders  Family Communication: Admission, patients condition and plan of care including tests being ordered have been discussed with the patient and brother who indicate understanding and agree with the plan and Code Status.  Code Status Full  Likely DC to   Home  Condition GUARDED   Time spent in minutes : 40    Lorielle Boehning K M.D on 02/05/2013 at 4:39 PM  Between 7am to 7pm - Pager - [phone]  After 7pm go to [email or web address] - password [email or web address]  And look for the night coverage person covering me after hours  [facility] Office  [phone]

## 2013-02-05 NOTE — ED Notes (Signed)
Pt states that he is not breathing any better,

## 2013-02-05 NOTE — ED Provider Notes (Signed)
CSN: [id number]     Arrival date & time 02/05/13  1134 History  This chart was scribed for Benny LennertJoseph L Adolphe Fortunato, MD,  by Ashley JacobsBrittany Andrews, ED Scribe. The patient was seen in room APA14/APA14 and the patient's care was started at 11:54 AM.    First MD Initiated Contact with Patient 02/05/13 1146     Chief Complaint  Patient presents with  . Shortness of Breath  . Chest Pain   (Consider location/radiation/quality/duration/timing/severity/associated sxs/prior Treatment) Patient is a 63 y.o. male presenting with chest pain. The history is provided by the patient and medical records (pt complains of sob). No language interpreter was used.  Chest Pain Pain location:  L chest Pain quality: aching   Pain radiates to:  Does not radiate Pain radiates to the back: no   Pain severity:  Mild Onset quality:  Sudden Associated symptoms: cough   Associated symptoms: no back pain and no fatigue    HPI Comments: Jacob CheadleRobert L Pace is a 63 y.o. male with COPD and emphysema presents to the Emergency Department complaining of constant, moderate SOB and chest pain for the past week. He states, he is "unable to get a good breath in". Pt is typically on 4 L of oxygen while at home. He tried a breathing treatment while at home.   Pt has the associated symptoms of cough and congestion. Pt denies fevers and nausea. He currently smokes 0.5 pack of cigarettes a day for th past 40 years. He drinks a 6 pack of alcohol everyday. Pt does not have any known allergies to medications.  Past Medical History  Diagnosis Date  . COPD (chronic obstructive pulmonary disease)   . Emphysema    Past Surgical History  Procedure Laterality Date  . Right inguinal hernia repair  2007   Family History  Problem Relation Age of Onset  . Cancer Father     unsure what type  . Breast cancer Sister    History  Substance Use Topics  . Smoking status: Current Every Day Smoker -- 0.50 packs/day for 40 years    Types: Cigarettes  .  Smokeless tobacco: Not on file  . Alcohol Use: Yes     Comment: drinks a 6 pack a day per pt    Review of Systems  Constitutional: Negative for appetite change and fatigue.  HENT: Positive for congestion. Negative for ear discharge and sinus pressure.   Eyes: Negative for discharge.  Respiratory: Positive for cough.   Cardiovascular: Positive for chest pain.  Gastrointestinal: Negative for diarrhea.  Genitourinary: Negative for frequency and hematuria.  Musculoskeletal: Negative for back pain.  Neurological: Negative for seizures.  Psychiatric/Behavioral: Negative for hallucinations.  All other systems reviewed and are negative.    Allergies  Review of patient's allergies indicates no known allergies.  Home Medications   Current Outpatient Rx  Name  Route  Sig  Dispense  Refill  . amoxicillin (AMOXIL) 500 MG capsule   Oral   Take 1 capsule (500 mg total) by mouth 3 (three) times daily.   21 capsule   0   . budesonide-formoterol (SYMBICORT) 160-4.5 MCG/ACT inhaler   Inhalation   Inhale 2 puffs into the lungs 2 (two) times daily.           . pantoprazole (PROTONIX) 20 MG tablet   Oral   Take 1 tablet (20 mg total) by mouth daily.   30 tablet   1   . promethazine (PHENERGAN) 25 MG tablet   Oral  Take 1 tablet (25 mg total) by mouth every 6 (six) hours as needed for nausea.   15 tablet   0   . tiotropium (SPIRIVA) 18 MCG inhalation capsule   Inhalation   Place 18 mcg into inhaler and inhale daily.            BP 85/68  Pulse 144  Temp(Src) 97.7 F (36.5 C) (Oral)  Resp 24  SpO2 99% Physical Exam  Constitutional: He is oriented to person, place, and time. He appears well-developed.  HENT:  Head: Normocephalic.  Eyes: Conjunctivae and EOM are normal. No scleral icterus.  Neck: Neck supple. No thyromegaly present.  Cardiovascular: Normal rate and regular rhythm.  Exam reveals no gallop and no friction rub.   No murmur heard. tacycardic   Pulmonary/Chest: No stridor. He has wheezes. He has no rales. He exhibits no tenderness.  Bilateral wheezes  Abdominal: He exhibits no distension. There is no tenderness. There is no rebound.  Musculoskeletal: Normal range of motion. He exhibits no edema.  Lymphadenopathy:    He has no cervical adenopathy.  Neurological: He is oriented to person, place, and time. He exhibits normal muscle tone. Coordination normal.  Skin: No rash noted. No erythema.  Psychiatric: He has a normal mood and affect. His behavior is normal.    ED Course  Procedures (including critical care time) DIAGNOSTIC STUDIES: Oxygen Saturation is 99% on , normal by my interpretation.    COORDINATION OF CARE:  11:58 PM Discussed course of care with pt which includes EKG , chest x-ray, Cardizem 25 mg/mL and Adenocard 6 mg/mL . Pt understands and agrees.  CRITICAL CARE Performed by: Ebin Palazzi L Total critical care time: 45 Critical care time was exclusive of separately billable procedures and treating other patients. Critical care was necessary to treat or prevent imminent or life-threatening deterioration. Critical care was time spent personally by me on the following activities: development of treatment plan with patient and/or surrogate as well as nursing, discussions with consultants, evaluation of patient's response to treatment, examination of patient, obtaining history from patient or surrogate, ordering and performing treatments and interventions, ordering and review of laboratory studies, ordering and review of radiographic studies, pulse oximetry and re-evaluation of patient's condition.    Labs Review Labs Reviewed - No data to display Imaging Review No results found.  pt had svt.  Did not convert with adenocard.  Did convert to sinus with cardiazem  MDM  Admit for copd and svt     The chart was scribed for me under my direct supervision.  I personally performed the history, physical, and medical  decision making and all procedures in the evaluation of this patient.Benny Lennert, MD 02/05/13 [phone]

## 2013-02-05 NOTE — ED Notes (Signed)
Pt  Updated on plan of care, family at bedside,

## 2013-02-06 DIAGNOSIS — I369 Nonrheumatic tricuspid valve disorder, unspecified: Secondary | ICD-10-CM

## 2013-02-06 LAB — CBC
HCT: 43.5 % (ref 39.0–52.0)
HEMOGLOBIN: 14.2 g/dL (ref 13.0–17.0)
MCH: 29.3 pg (ref 26.0–34.0)
MCHC: 32.6 g/dL (ref 30.0–36.0)
MCV: 89.9 fL (ref 78.0–100.0)
Platelets: 348 10*3/uL (ref 150–400)
RBC: 4.84 MIL/uL (ref 4.22–5.81)
RDW: 16.1 % — ABNORMAL HIGH (ref 11.5–15.5)
WBC: 11.8 10*3/uL — ABNORMAL HIGH (ref 4.0–10.5)

## 2013-02-06 LAB — BLOOD GAS, ARTERIAL
Acid-Base Excess: 4 mmol/L — ABNORMAL HIGH (ref 0.0–2.0)
Bicarbonate: 27.8 mEq/L — ABNORMAL HIGH (ref 20.0–24.0)
Drawn by: 22766
FIO2: 0.4 %
O2 Content: 5 L/min
O2 Saturation: 96.6 %
PCO2 ART: 40.5 mmHg (ref 35.0–45.0)
Patient temperature: 37
TCO2: 24 mmol/L (ref 0–100)
pH, Arterial: 7.452 — ABNORMAL HIGH (ref 7.350–7.450)
pO2, Arterial: 89.6 mmHg (ref 80.0–100.0)

## 2013-02-06 LAB — TSH: TSH: 0.611 u[IU]/mL (ref 0.350–4.500)

## 2013-02-06 LAB — BASIC METABOLIC PANEL
BUN: 10 mg/dL (ref 6–23)
CO2: 25 mEq/L (ref 19–32)
CREATININE: 0.61 mg/dL (ref 0.50–1.35)
Calcium: 9.4 mg/dL (ref 8.4–10.5)
Chloride: 96 mEq/L (ref 96–112)
GFR calc Af Amer: >90 mL/min (ref >90)
Glucose, Bld: 147 mg/dL — ABNORMAL HIGH (ref 70–99)
Potassium: 4 mEq/L (ref 3.7–5.3)
Sodium: 136 mEq/L — ABNORMAL LOW (ref 137–147)

## 2013-02-06 LAB — TROPONIN I: Troponin I: <0.3 ng/mL (ref <0.30)

## 2013-02-06 LAB — MAGNESIUM: Magnesium: 2 mg/dL (ref 1.5–2.5)

## 2013-02-06 NOTE — Progress Notes (Signed)
*  PRELIMINARY RESULTS* Echocardiogram 2D Echocardiogram has been performed.  Codee Bloodworth 02/06/2013, 9:49 AM

## 2013-02-06 NOTE — Progress Notes (Signed)
[facility]                                                                                Patient Demographics  Jacob Pace, is a 63 y.o. male, DOB - 1950-08-05, ZOX:[id number]  Admit date - 02/05/2013   Admitting Physician Leroy Sea, MD  Outpatient Primary MD for the patient is No PCP Per Patient  LOS - 1   Chief Complaint  Patient presents with  . Shortness of Breath  . Chest Pain        Assessment & Plan    1. 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 His bilateral air movement is minimal and actually there is minimal wheezing, says he feels about 40% better than what he came in with however he still in some respiratory distress, does not want to try BiPAP, ABG looks stable, for now we will continue IV Solu-Medrol and Levaquin, scheduled and when necessary nebulizer treatments, oxygen 6 L as he uses closed with 5 L at home.      2. Brief episode of SVT in the ER upon admission secondary to #1 above. He converted to sinus rhythm on Cardizem IV which will be continued in oral form, due to increased work of breathing he had some chest pressure it has now completely resolved we'll discontinue nitro paste, repeat EKG has no acute changes first set of troponin is negative, negative serial troponin , continue him on low-dose aspirin also. Pending TSH, echogram and request cardiology to evaluate.   No results found for this basename: TSH      3. Ongoing smoking abuse. Counseled again to quit smoking.      4. Mild reactionary leukocytosis due to #1 above. Clinically no signs of pneumonia or infection. Could have mild bronchitis and and should suffice. Leukocytosis is improving.     5. Nonspecific elevation of d-dimer. No swelling in  legs, CT angiogram chest negative. D-dimer elevated likely due to the stress from #1 above and mild bronchitis.        Code Status: full  Family Communication: brother  Disposition Plan: Home   Procedures CTA, echo ordered   Consults     Medications  Scheduled Meds: . aspirin  81 mg Oral Daily  . budesonide-formoterol  2 puff Inhalation BID  . diltiazem  90 mg Oral Q12H  . enoxaparin (LOVENOX) injection  40 mg Subcutaneous Q24H  . levalbuterol  0.63 mg Nebulization Q4H  . levofloxacin  500 mg Oral Daily  . methylPREDNISolone (SOLU-MEDROL) injection  80 mg Intravenous Q6H  . nitroGLYCERIN  0.5 inch Topical Q6H  . pneumococcal 23 valent vaccine  0.5 mL Intramuscular Tomorrow-1000  . sodium chloride  3 mL Intravenous Q12H   Continuous Infusions:  PRN Meds:.acetaminophen, acetaminophen, albuterol, alum & mag hydroxide-simeth, guaiFENesin-dextromethorphan, HYDROcodone-acetaminophen, ondansetron (ZOFRAN) IV, ondansetron, polyethylene glycol  DVT Prophylaxis  Lovenox    Lab Results  Component Value Date   PLT 348 02/06/2013    Antibiotics  Anti-infectives   Start     Dose/Rate Route Frequency Ordered Stop   02/05/13 1714  levofloxacin (LEVAQUIN) 500 MG tablet    Comments:  Noah CharonShelton, Penny   : cabinet override      02/05/13 1714 02/05/13 1718   02/05/13 1630  levofloxacin (LEVAQUIN) tablet 500 mg     500 mg Oral Daily 02/05/13 1628            Subjective:   Molly Maduroobert Wilds today has, No headache, No chest pain, No abdominal pain - No Nausea, No new weakness tingling or numbness, says feels  Cough - SOB better by 40%.    Objective:   Filed Vitals:   02/05/13 2244 02/06/13 0259 02/06/13 0455 02/06/13 0647  BP:   139/88   Pulse:   92   Temp:   98.7 F (37.1 C)   TempSrc:   Oral   Resp:   21   Height:      Weight:   81.2 kg (179 lb 0.2 oz)   SpO2: 96% 97% 95% 97%    Wt Readings from Last 3 Encounters:  02/06/13 81.2 kg (179 lb 0.2 oz)  10/09/12 92.987  kg (205 lb)  02/17/12 92.987 kg (205 lb)     Intake/Output Summary (Last 24 hours) at 02/06/13 0901 Last data filed at 02/06/13 0516  Gross per 24 hour  Intake    240 ml  Output   2400 ml  Net  -2160 ml    Exam Awake Alert, Oriented X 3, No new F.N deficits, Normal affect [place].AT,PERRAL Supple Neck,No JVD, No cervical lymphadenopathy appriciated.  Symmetrical Chest wall movement, poor air movement bilaterally, minimal wheezing RRR,No Gallops,Rubs or new Murmurs, No Parasternal Heave +ve B.Sounds, Abd Soft, Non tender, No organomegaly appriciated, No rebound - guarding or rigidity. No Cyanosis, Clubbing or edema, No new Rash or bruise     Data Review   Micro Results No results found for this or any previous visit (from the past 240 hour(s)).  Radiology Reports Ct Angio Chest Pe W/cm &/or Wo Cm  02/05/2013   CLINICAL DATA:  Shortness of breath.  EXAM: CT ANGIOGRAPHY CHEST WITH CONTRAST  TECHNIQUE: Multidetector CT imaging of the chest was performed using the standard protocol during bolus administration of intravenous contrast. Multiplanar CT image reconstructions including MIPs were obtained to evaluate the vascular anatomy.  CONTRAST:  100mL OMNIPAQUE IOHEXOL 350 MG/ML SOLN  COMPARISON:  Chest x-ray on 02/05/2013  FINDINGS: The pulmonary arteries are well opacified. There is no evidence of pulmonary embolism. The thoracic aorta is of normal caliber. Lungs show advanced emphysematous disease with large emphysematous bullae present in both upper lung zones. There is no evidence of pulmonary consolidation, edema, mass or pneumothorax. No pleural or pericardial fluid is identified.  The heart size is normal. No enlarged lymph nodes are identified. Bony structures are unremarkable.  Review of the MIP images confirms the above findings.  IMPRESSION: No evidence of pulmonary embolism or other acute findings. Advanced emphysematous lung disease present.   Electronically Signed   By: Irish LackGlenn   Yamagata M.D.   On: 02/05/2013 15:24   Dg Chest Portable 1 View  02/05/2013   CLINICAL DATA:  Cough and congestion for a few days. Sided onset is severe shortness of breath this morning. History of emphysema.  EXAM: PORTABLE CHEST - 1 VIEW  COMPARISON:  10/09/2012, 02/17/2012 and 03/30/2007 chest x-ray. 02/28/2008 chest CT.  FINDINGS: Right apical parenchymal changes with bullae and pleural thickening similar to  prior exams. No associated bony destruction.  Mild left apical pleural thickening stable.  Heart size top-normal.  Pulmonary vascular prominence most notable centrally superimposed upon chronic lung changes without findings of frank pulmonary edema.  No segmental infiltrate or gross pneumothorax.  Calcified slightly tortuous aorta.  The patient would eventually benefit from two view chest.  IMPRESSION: Mild central pulmonary vascular prominence superimposed upon chronic changes as detailed above.   Electronically Signed   By: Bridgett Larsson M.D.   On: 02/05/2013 13:02    CBC  Recent Labs Lab 02/05/13 1212 02/06/13 0523  WBC 13.6* 11.8*  HGB 15.3 14.2  HCT 47.6 43.5  PLT 369 348  MCV 92.1 89.9  MCH 29.6 29.3  MCHC 32.1 32.6  RDW 16.3* 16.1*  LYMPHSABS 3.0  --   MONOABS 0.7  --   EOSABS 0.2  --   BASOSABS 0.0  --     Chemistries   Recent Labs Lab 02/05/13 1212 02/05/13 2005 02/06/13 0523  NA 138  --  136*  K 4.2  --  4.0  CL 97  --  96  CO2 29  --  25  GLUCOSE 103*  --  147*  BUN 7  --  10  CREATININE 0.73 0.62 0.61  CALCIUM 8.8  --  9.4  MG  --   --  2.0  AST 34  --   --   ALT 15  --   --   ALKPHOS 68  --   --   BILITOT 0.1*  --   --    ------------------------------------------------------------------------------------------------------------------ estimated creatinine clearance is 108.5 ml/min (by C-G formula based on Cr of 0.61). ------------------------------------------------------------------------------------------------------------------ No results found  for this basename: HGBA1C,  in the last 72 hours ------------------------------------------------------------------------------------------------------------------ No results found for this basename: CHOL, HDL, LDLCALC, TRIG, CHOLHDL, LDLDIRECT,  in the last 72 hours ------------------------------------------------------------------------------------------------------------------ No results found for this basename: TSH, T4TOTAL, FREET3, T3FREE, THYROIDAB,  in the last 72 hours ------------------------------------------------------------------------------------------------------------------ No results found for this basename: VITAMINB12, FOLATE, FERRITIN, TIBC, IRON, RETICCTPCT,  in the last 72 hours  Coagulation profile  Recent Labs Lab 02/05/13 2005  INR 1.08     Recent Labs  02/05/13 1212  DDIMER 1.16*    Cardiac Enzymes  Recent Labs Lab 02/05/13 2005 02/06/13 0111 02/06/13 0627  TROPONINI <0.30 <0.30 <0.30   ------------------------------------------------------------------------------------------------------------------ No components found with this basename: POCBNP,      Time Spent in minutes  35   SINGH,PRASHANT K M.D on 02/06/2013 at 9:01 AM  Between 7am to 7pm - Pager - [phone]  After 7pm go to [email or web address] - password [email or web address]  And look for the night coverage person covering for me after hours  [facility] Office  [phone]

## 2013-02-07 DIAGNOSIS — I471 Supraventricular tachycardia, unspecified: Secondary | ICD-10-CM

## 2013-02-07 DIAGNOSIS — R079 Chest pain, unspecified: Secondary | ICD-10-CM

## 2013-02-07 NOTE — Progress Notes (Signed)
[facility]                                                                                Patient Demographics  Jacob Pace, is a 63 y.o. male, DOB - April 09, 1950, ZOX:[id number]  Admit date - 02/05/2013   Admitting Physician Leroy Sea, MD  Outpatient Primary MD for the patient is No PCP Per Patient  LOS - 2   Chief Complaint  Patient presents with  . Shortness of Breath  . Chest Pain        Assessment & Plan    1. 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 His bilateral air movement is minimal and actually there is minimal wheezing, says he feels about 40% better than what he came in with however he still in some respiratory distress but he and his brother whose bedside confound that this is how he breathes at baseline,  ABG looks stable, for now we will continue IV Solu-Medrol and Levaquin, scheduled and when necessary nebulizer treatments, oxygen 6 L as he uses closed with 5 L at home. I think he would benefit for another 2 day stay in the hospital.     2. Brief episode of SVT in the ER upon admission secondary to #1 above. He converted to sinus rhythm on Cardizem IV which will be continued in oral form, due to increased work of breathing he had some chest pressure it has now completely resolved we'll discontinue nitro paste, repeat EKG has no acute changes first set of troponin is negative, negative serial troponin , continue him on low-dose aspirin also. Stable TSH and echogram. Pending evaluation by cardiology .   Lab Results  Component Value Date   TSH 0.611 02/05/2013    Echo  - Left ventricle: The cavity size was normal. Wall thickness was normal. Systolic function was normal. The estimated ejection fraction was in the range of 60% to  65%. The study is not technically sufficient to allow evaluation of LV diastolic function. - Aortic valve: Mildly calcified annulus. Mildly thickened leaflets. Valve area: 2.66cm^2(VTI). Valve area: 2.05cm^2 (Vmax). - Left atrium: Grossly appears mildly enlarged. - Right ventricle: The right ventricle is poorly visualized. It appears moderately enlarged sharing the apex with the left ventricle. Systolic function appears grossly normal. - Right atrium: Not well visuazlied. Grossly appears mildly enlarged. - Pulmonary arteries: Systolic pressure was moderately increased. PA peak pressure: 45mm Hg (S). - Systemic veins: The IVC is dilated with >50% respiratory variation, estimated RA pressure is 8 mmHg.      3. Ongoing smoking abuse. Counseled again to quit smoking.      4. Mild reactionary leukocytosis due to #1 above. Clinically no signs of pneumonia or infection. Could have mild bronchitis and and should suffice. Leukocytosis is improving.     5. Nonspecific elevation of d-dimer. No swelling in legs, CT angiogram chest negative. D-dimer elevated likely due to the stress from #1 above and mild bronchitis.  Code Status: full  Family Communication: brother  Disposition Plan: Home   Procedures CTA, Echo     Consults  Cards   Medications  Scheduled Meds: . aspirin  81 mg Oral Daily  . budesonide-formoterol  2 puff Inhalation BID  . diltiazem  90 mg Oral Q12H  . enoxaparin (LOVENOX) injection  40 mg Subcutaneous Q24H  . levalbuterol  0.63 mg Nebulization Q4H  . levofloxacin  500 mg Oral Daily  . methylPREDNISolone (SOLU-MEDROL) injection  80 mg Intravenous Q6H  . sodium chloride  3 mL Intravenous Q12H   Continuous Infusions:  PRN Meds:.acetaminophen, acetaminophen, albuterol, alum & mag hydroxide-simeth, guaiFENesin-dextromethorphan, HYDROcodone-acetaminophen, ondansetron (ZOFRAN) IV, ondansetron, polyethylene glycol  DVT Prophylaxis  Lovenox    Lab Results   Component Value Date   PLT 348 02/06/2013    Antibiotics     Anti-infectives   Start     Dose/Rate Route Frequency Ordered Stop   02/05/13 1714  levofloxacin (LEVAQUIN) 500 MG tablet    Comments:  Noah CharonShelton, Penny   : cabinet override      02/05/13 1714 02/05/13 1718   02/05/13 1630  levofloxacin (LEVAQUIN) tablet 500 mg     500 mg Oral Daily 02/05/13 1628            Subjective:   Jacob Pace today has, No headache, No chest pain, No abdominal pain - No Nausea, No new weakness tingling or numbness, says feels  Cough - SOB better by 40%.    Objective:   Filed Vitals:   02/06/13 2211 02/06/13 2333 02/07/13 0415 02/07/13 0719  BP: 129/89  125/81   Pulse: 83  69   Temp: 98.2 F (36.8 C)  97.7 F (36.5 C)   TempSrc:      Resp: 24  22   Height:      Weight:      SpO2: 96% 97% 100% 95%    Wt Readings from Last 3 Encounters:  02/06/13 81.2 kg (179 lb 0.2 oz)  10/09/12 92.987 kg (205 lb)  02/17/12 92.987 kg (205 lb)     Intake/Output Summary (Last 24 hours) at 02/07/13 0840 Last data filed at 02/06/13 1849  Gross per 24 hour  Intake    840 ml  Output      0 ml  Net    840 ml    Exam Awake Alert, Oriented X 3, No new F.N deficits, Normal affect [place].AT,PERRAL Supple Neck,No JVD, No cervical lymphadenopathy appriciated.  Symmetrical Chest wall movement, poor air movement bilaterally, minimal wheezing RRR,No Gallops,Rubs or new Murmurs, No Parasternal Heave +ve B.Sounds, Abd Soft, Non tender, No organomegaly appriciated, No rebound - guarding or rigidity. No Cyanosis, Clubbing or edema, No new Rash or bruise     Data Review   Micro Results No results found for this or any previous visit (from the past 240 hour(s)).  Radiology Reports Ct Angio Chest Pe W/cm &/or Wo Cm  02/05/2013   CLINICAL DATA:  Shortness of breath.  EXAM: CT ANGIOGRAPHY CHEST WITH CONTRAST  TECHNIQUE: Multidetector CT imaging of the chest was performed using the standard protocol during bolus  administration of intravenous contrast. Multiplanar CT image reconstructions including MIPs were obtained to evaluate the vascular anatomy.  CONTRAST:  100mL OMNIPAQUE IOHEXOL 350 MG/ML SOLN  COMPARISON:  Chest x-ray on 02/05/2013  FINDINGS: The pulmonary arteries are well opacified. There is no evidence of pulmonary embolism. The thoracic aorta is of normal caliber. Lungs show advanced emphysematous disease with large emphysematous  bullae present in both upper lung zones. There is no evidence of pulmonary consolidation, edema, mass or pneumothorax. No pleural or pericardial fluid is identified.  The heart size is normal. No enlarged lymph nodes are identified. Bony structures are unremarkable.  Review of the MIP images confirms the above findings.  IMPRESSION: No evidence of pulmonary embolism or other acute findings. Advanced emphysematous lung disease present.   Electronically Signed   By: Irish Lack M.D.   On: 02/05/2013 15:24   Dg Chest Portable 1 View  02/05/2013   CLINICAL DATA:  Cough and congestion for a few days. Sided onset is severe shortness of breath this morning. History of emphysema.  EXAM: PORTABLE CHEST - 1 VIEW  COMPARISON:  10/09/2012, 02/17/2012 and 03/30/2007 chest x-ray. 02/28/2008 chest CT.  FINDINGS: Right apical parenchymal changes with bullae and pleural thickening similar to prior exams. No associated bony destruction.  Mild left apical pleural thickening stable.  Heart size top-normal.  Pulmonary vascular prominence most notable centrally superimposed upon chronic lung changes without findings of frank pulmonary edema.  No segmental infiltrate or gross pneumothorax.  Calcified slightly tortuous aorta.  The patient would eventually benefit from two view chest.  IMPRESSION: Mild central pulmonary vascular prominence superimposed upon chronic changes as detailed above.   Electronically Signed   By: Bridgett Larsson M.D.   On: 02/05/2013 13:02    CBC  Recent Labs Lab 02/05/13 1212  02/06/13 0523  WBC 13.6* 11.8*  HGB 15.3 14.2  HCT 47.6 43.5  PLT 369 348  MCV 92.1 89.9  MCH 29.6 29.3  MCHC 32.1 32.6  RDW 16.3* 16.1*  LYMPHSABS 3.0  --   MONOABS 0.7  --   EOSABS 0.2  --   BASOSABS 0.0  --     Chemistries   Recent Labs Lab 02/05/13 1212 02/05/13 2005 02/06/13 0523  NA 138  --  136*  K 4.2  --  4.0  CL 97  --  96  CO2 29  --  25  GLUCOSE 103*  --  147*  BUN 7  --  10  CREATININE 0.73 0.62 0.61  CALCIUM 8.8  --  9.4  MG  --   --  2.0  AST 34  --   --   ALT 15  --   --   ALKPHOS 68  --   --   BILITOT 0.1*  --   --    ------------------------------------------------------------------------------------------------------------------ estimated creatinine clearance is 108.5 ml/min (by C-G formula based on Cr of 0.61). ------------------------------------------------------------------------------------------------------------------ No results found for this basename: HGBA1C,  in the last 72 hours ------------------------------------------------------------------------------------------------------------------ No results found for this basename: CHOL, HDL, LDLCALC, TRIG, CHOLHDL, LDLDIRECT,  in the last 72 hours ------------------------------------------------------------------------------------------------------------------  Recent Labs  02/05/13 2005  TSH 0.611   ------------------------------------------------------------------------------------------------------------------ No results found for this basename: VITAMINB12, FOLATE, FERRITIN, TIBC, IRON, RETICCTPCT,  in the last 72 hours  Coagulation profile  Recent Labs Lab 02/05/13 2005  INR 1.08     Recent Labs  02/05/13 1212  DDIMER 1.16*    Cardiac Enzymes  Recent Labs Lab 02/05/13 2005 02/06/13 0111 02/06/13 0627  TROPONINI <0.30 <0.30 <0.30   ------------------------------------------------------------------------------------------------------------------ No components  found with this basename: POCBNP,      Time Spent in minutes  35   SINGH,PRASHANT K M.D on 02/07/2013 at 8:40 AM  Between 7am to 7pm - Pager - [phone]  After 7pm go to [email or web address] - password [email or web address]  And look for the night coverage  person covering for me after hours  [facility] Office  [phone]

## 2013-02-07 NOTE — Consult Note (Signed)
CARDIOLOGY CONSULT NOTE   Patient ID: Jacob CheadleRobert L Pace MRN: [id number] DOB/AGE: [birth year] 63 y.o.  Admit Date: 02/05/2013 Referring Physician: PTH Primary Physician: No PCP Per Patient Consulting Cardiologist: Dina RichBranch, Andie Mungin Primary Cardiologist: New Reason for Consultation: PSVT  Clinical Summary Jacob Pace is a 63 y.o.male admitted with dyspnea, COPD exacerbation, and had a brief run of SVT with IV Cardizem 10mg  conversion to NSR. History of emphysema with O2 dependence and [facility] [place]. the patient states he was in his usual state of health, when he had sudden onset of shortness of breath and chest pressure. He decided to lie down but was unable to get to his bed without worsening symptoms, and his nephew brought him to ER. He described pressure in his chest "like a melanoma chest" trouble getting breath in but was able to exhale. He could not tell that his heart rate was elevated. He denied fevers chills or productive cough.    He has no prior history of cardiac disease or cardiac workup in the past. He has never had an episode of racing heart rate or palpitations. He does have a family history of premature coronary artery disease with a brother having an MI age 64. Brother had stent placed to unknown artery.   On arrival to the emergency room, heart rate was documented at 144 beats per minute rising to 158 beats per minute. EKG did demonstrate some mild inferior T-wave abnormality, but not pathologic. Cardiac enzymes are found be negative x3. Mildly elevated white blood cells 11.8. CT scan was negative for pulmonary emboli or cardiomegaly.  He did have advanced emphysema noted. He was given IV lasix with 2 liter diureses in 12 hours. He is currently without complaint and is breathing better.   No Known Allergies  Medications Scheduled Medications: . aspirin  81 mg Oral Daily  . budesonide-formoterol  2 puff Inhalation BID  . diltiazem  90 mg Oral Q12H   . enoxaparin (LOVENOX) injection  40 mg Subcutaneous Q24H  . levalbuterol  0.63 mg Nebulization Q4H  . levofloxacin  500 mg Oral Daily  . methylPREDNISolone (SOLU-MEDROL) injection  80 mg Intravenous Q6H  . sodium chloride  3 mL Intravenous Q12H     PRN Medications:  acetaminophen, acetaminophen, albuterol, alum & mag hydroxide-simeth, guaiFENesin-dextromethorphan, HYDROcodone-acetaminophen, ondansetron (ZOFRAN) IV, ondansetron, polyethylene glycol   Past Medical History  Diagnosis Date  . COPD (chronic obstructive pulmonary disease)   . Emphysema   . Hypertension     Past Surgical History  Procedure Laterality Date  . Right inguinal hernia repair  2007  . Hernia repair      Family History  Problem Relation Age of Onset  . Cancer Father     unsure what type  . Breast cancer Sister     Social History Jacob Pace reports that he has been smoking Cigarettes.  He has a 20 pack-year smoking history. He does not have any smokeless tobacco history on file. Jacob Pace reports that he drinks alcohol.  Review of Systems Otherwise reviewed and negative except as outlined.  Physical Examination Blood pressure 125/81, pulse 69, temperature 97.7 F (36.5 C), temperature source Oral, resp. rate 22, height 6\' 2"  (1.88 m), weight 179 lb 0.2 oz (81.2 kg), SpO2 95.00%.  Intake/Output Summary (Last 24 hours) at 02/07/13 0948 Last data filed at 02/06/13 1849  Gross per 24 hour  Intake    600 ml  Output      0  ml  Net    600 ml    Prior Cardiac Testing/Procedures  1. Echocardiogram: Study data: Technically difficult. Very poor acoustic windows. - Left ventricle: The cavity size was normal. Wall thickness was normal. Systolic function was normal. The estimated ejection fraction was in the range of 60% to 65%. The study is not technically sufficient to allow evaluation of LV diastolic function. - Aortic valve: Mildly calcified annulus. Mildly thickened leaflets. Valve area:  2.66cm^2(VTI). Valve area: 2.05cm^2 (Vmax). - Left atrium: Grossly appears mildly enlarged. - Right ventricle: The right ventricle is poorly visualized. It appears moderately enlarged sharing the apex with the left ventricle. Systolic function appears grossly normal. - Right atrium: Not well visuazlied. Grossly appears mildly enlarged. - Pulmonary arteries: Systolic pressure was moderately increased. PA peak pressure: 45mm Hg (S). - Systemic veins: The IVC is dilated with >50% respiratory variation, estimated RA pressure is 8 mmHg.   Lab Results  Basic Metabolic Panel:  Recent Labs Lab 02/05/13 1212 02/05/13 2005 02/06/13 0523  NA 138  --  136*  K 4.2  --  4.0  CL 97  --  96  CO2 29  --  25  GLUCOSE 103*  --  147*  BUN 7  --  10  CREATININE 0.73 0.62 0.61  CALCIUM 8.8  --  9.4  MG  --   --  2.0    Liver Function Tests:  Recent Labs Lab 02/05/13 1212  AST 34  ALT 15  ALKPHOS 68  BILITOT 0.1*  PROT 7.6  ALBUMIN 3.3*    CBC:  Recent Labs Lab 02/05/13 1212 02/06/13 0523  WBC 13.6* 11.8*  NEUTROABS 9.7*  --   HGB 15.3 14.2  HCT 47.6 43.5  MCV 92.1 89.9  PLT 369 348    Cardiac Enzymes:  Recent Labs Lab 02/05/13 1212 02/05/13 2005 02/06/13 0111 02/06/13 0627  TROPONINI <0.30 <0.30 <0.30 <0.30    Radiology: Ct Angio Chest Pe W/cm &/or Wo Cm  02/05/2013   CLINICAL DATA:  Shortness of breath.  EXAM: CT ANGIOGRAPHY CHEST WITH CONTRAST  TECHNIQUE: Multidetector CT imaging of the chest was performed using the standard protocol during bolus administration of intravenous contrast. Multiplanar CT image reconstructions including MIPs were obtained to evaluate the vascular anatomy.  CONTRAST:  OMNIPAQUE IOHEXOL 350 MG/ML SOLN  COMPARISON:  Chest x-ray on 02/05/2013  FINDINGS: The pulmonary arteries are well opacified. There is no evidence of pulmonary embolism. The thoracic aorta is of normal caliber. Lungs show advanced emphysematous disease with large  emphysematous bullae present in both upper lung zones. There is no evidence of pulmonary consolidation, edema, mass or pneumothorax. No pleural or pericardial fluid is identified.  The heart size is normal. No enlarged lymph nodes are identified. Bony structures are unremarkable.  Review of the MIP images confirms the above findings.  IMPRESSION: No evidence of pulmonary embolism or other acute findings. Advanced emphysematous lung disease present.   Electronically Signed   By: Irish Lack M.D.   On: 02/05/2013 15:24   Dg Chest Portable 1 View  02/05/2013   CLINICAL DATA:  Cough and congestion for a few days. Sided onset is severe shortness of breath this morning. History of emphysema.  EXAM: PORTABLE CHEST - 1 VIEW  COMPARISON:  10/09/2012, 02/17/2012 and 03/30/2007 chest x-ray. 02/28/2008 chest CT.  FINDINGS: Right apical parenchymal changes with bullae and pleural thickening similar to prior exams. No associated bony destruction.  Mild left apical pleural thickening stable.  Heart  size top-normal.  Pulmonary vascular prominence most notable centrally superimposed upon chronic lung changes without findings of frank pulmonary edema.  No segmental infiltrate or gross pneumothorax.  Calcified slightly tortuous aorta.  The patient would eventually benefit from two view chest.  IMPRESSION: Mild central pulmonary vascular prominence superimposed upon chronic changes as detailed above.   Electronically Signed   By: Bridgett Larsson M.D.   On: 02/05/2013 13:02     ECG:  Intitially on admission:SVT rate of 156 bpm   Impression and Recommendations  1. PSVT: Likely related to acute respiratory distress in the setting of emphysema and COPD exacerbation. Converted to NSR after one dose of diltiazem 10 mg. He remains on PO diltiazem 90 mg Q 12hrs. Cardiac enzymes are negative, arguing against ACS. O2 dependent. CVRF for FH of premature CAD in brother, tobacco abuse (says he quit day of admission smoking since age  67), hypertension, age, male, unknown lipid status.   Echo demonstrates normal LV fx without evidence of WMA, elevation in PAP likely related to COPD, emphysema with negative CT scan for PE. He remains on Cardizem 90 mg every 12 hours, and aspirin. Unable to assess for diastolic dysfunction.  Risk stratification with lipid study in am, and consideration for stress test with CVRF.   2. COPD: Continue O2 support and follow up with pulmonary.Sees Dr. Shelle Iron, but no notes after 2012. Followed by [facility] in [place].  3. Hypertension: States he is no longer hypertensive, and is not on any medications for this at home. Had been on antihypertensive in the past but became hypotensive and was taken off of them. BP appears stable and well controlled, on diltiazem..   4. Tobacco Abuse: Smoking since age of 57. He states he quit the day he came in. He is counseled on smoking cessation and it is our hope that he will continue to abstain after discharge.  5.ETOH Frequency: States he has 2-3 beers a day. Denies drunkenness or dependency.  Signed: Bettey Mare. Lyman Bishop NP [facility] 02/07/2013, 9:48 AM Co-Sign MD  Attending note  Patient seen and discussed with NP Lyman Bishop, I agree with her documentation above. 63 yo male history of severe COPD on home oxygen, EtoH abuse admitted with shortness of breath and chest pain. In ER patient had narrow complex regular tachycardia rate 150s that broke with IV cardizem, maintains normal sinus rhythm on oral dilt. Cardiac enzymes have been negative, awaiting repeat EKG now that he is in sinus rhythm. Echo shows normal LV systolic function with no wall motion abnormalities. Described atypical chest pain, feeling of tightness in chest worst with movement and breathing in setting of SVT and COPD exacerbation. Now resolved. PSVT likely related to chronic lung disease and EtOH use, agree with continued oral dilt. Patient does have CAD risk factors, but overall presentation  not consistent with cardiac ischemia. Continue risk factor modification including smoking cessation, can reassess symptoms as outpatient and possible need for ischemic evaluation at that time. Will continue to follow patient and telemetry.  Dina Rich MD

## 2013-02-08 DIAGNOSIS — J9621 Acute and chronic respiratory failure with hypoxia: Secondary | ICD-10-CM | POA: Diagnosis present

## 2013-02-08 DIAGNOSIS — J9620 Acute and chronic respiratory failure, unspecified whether with hypoxia or hypercapnia: Secondary | ICD-10-CM

## 2013-02-08 DIAGNOSIS — J9622 Acute and chronic respiratory failure with hypercapnia: Secondary | ICD-10-CM | POA: Diagnosis present

## 2013-02-08 LAB — LIPID PANEL
CHOL/HDL RATIO: 2.8 ratio
CHOLESTEROL: 227 mg/dL — AB (ref 0–200)
HDL: 82 mg/dL (ref >39)
LDL Cholesterol: 125 mg/dL — ABNORMAL HIGH (ref 0–99)
Triglycerides: 101 mg/dL (ref <150)
VLDL: 20 mg/dL (ref 0–40)

## 2013-02-08 MED ORDER — DILTIAZEM HCL ER COATED BEADS 180 MG PO CP24
180.0000 mg | ORAL_CAPSULE | Freq: Every day | ORAL | Status: DC
  Administered 2013-02-08 – 2013-02-09 (×2): 180 mg via ORAL
  Filled 2013-02-08 (×2): qty 1

## 2013-02-08 MED ORDER — ATORVASTATIN CALCIUM 20 MG PO TABS: 20.0000 mg | ORAL_TABLET | Freq: Every day | ORAL | Status: DC

## 2013-02-08 MED ORDER — PREDNISONE 20 MG PO TABS
50.0000 mg | ORAL_TABLET | Freq: Every day | ORAL | Status: DC
  Administered 2013-02-09: 50 mg via ORAL
  Filled 2013-02-08: qty 1
  Filled 2013-02-08: qty 2

## 2013-02-08 MED ORDER — ATORVASTATIN CALCIUM 20 MG PO TABS
20.0000 mg | ORAL_TABLET | Freq: Every day | ORAL | Status: DC
  Administered 2013-02-08: 20 mg via ORAL
  Filled 2013-02-08: qty 1

## 2013-02-08 MED ORDER — DILTIAZEM HCL ER COATED BEADS 180 MG PO CP24: 180.0000 mg | ORAL_CAPSULE | Freq: Every day | ORAL | Status: DC

## 2013-02-08 MED ORDER — ASPIRIN 81 MG PO CHEW: 81.0000 mg | CHEWABLE_TABLET | Freq: Every day | ORAL | Status: DC

## 2013-02-08 MED ORDER — PREDNISONE 10 MG PO TABS: ORAL_TABLET | ORAL | Status: DC

## 2013-02-08 MED ORDER — ALBUTEROL SULFATE (2.5 MG/3ML) 0.083% IN NEBU: 5.0000 mg | INHALATION_SOLUTION | RESPIRATORY_TRACT | Status: DC | PRN

## 2013-02-08 MED ORDER — PANTOPRAZOLE SODIUM 40 MG PO TBEC: 40.0000 mg | DELAYED_RELEASE_TABLET | Freq: Every day | ORAL | Status: DC

## 2013-02-08 MED ORDER — PANTOPRAZOLE SODIUM 40 MG PO TBEC
40.0000 mg | DELAYED_RELEASE_TABLET | Freq: Two times a day (BID) | ORAL | Status: DC
  Administered 2013-02-08 – 2013-02-09 (×3): 40 mg via ORAL
  Filled 2013-02-08 (×3): qty 1

## 2013-02-08 NOTE — Care Management Note (Signed)
    Page 1 of 1   02/08/2013     2:20:33 PM   CARE MANAGEMENT NOTE 02/08/2013  Patient:  Lolita CramSCALES,Algenis L   Account Number:  [id number]  Date Initiated:  02/08/2013  Documentation initiated by:  Rosemary HolmsOBSON,Aloni Chuang  Subjective/Objective Assessment:   Pt states he lives at home with his nephew. [facility] [place]. Election form signed electing not to transfer. [facility] Form in shadow chart. [facility]     Action/Plan:   Anticipated DC Date:     Anticipated DC Plan:  HOME/SELF CARE      DC Planning Services  CM consult      Choice offered to / List presented to:             Status of service:  Completed, signed off Medicare Important Message given?   (If response is "NO", the following Medicare IM given date fields will be blank) Date Medicare IM given:   Date Additional Medicare IM given:    Discharge Disposition:    Per UR Regulation:    If discussed at Long Length of Stay Meetings, dates discussed:    Comments:  02/08/13 Rosemary HolmsAmy Kalaysia Demonbreun RN BNS CMN

## 2013-02-08 NOTE — Discharge Summary (Signed)
Physician Discharge Summary  Jacob Pace ONG:[id number] DOB: 1950-11-27 DOA: 02/05/2013  PCP: No PCP Per Patient  Admit date: 02/05/2013 Discharge date: 02/08/2013  Time spent: 40 minutes  Recommendations for Outpatient Follow-up:  1. Follow up with primary care doctor in 1-2 weeks 2. Followup with cardiology in 2-3 weeks.  Discharge Diagnoses:  Active Problems:   CHRONIC OBSTRUCTIVE PULMONARY DISEASE, ACUTE EXACERBATION   EMPHYSEMA   GASTRITIS   GASTROINTESTINAL XRAY, ABNORMAL   BRONCHITIS, ACUTE   COPD (chronic obstructive pulmonary disease)   COPD exacerbation   PSVT (paroxysmal supraventricular tachycardia)   Chest pain   Discharge Condition: improved  Diet recommendation: low salt  Filed Weights   02/05/13 1831 02/06/13 0455 02/08/13 0500  Weight: 81.1 kg (178 lb 12.7 oz) 81.2 kg (179 lb 0.2 oz) 79.7 kg (175 lb 11.3 oz)    History of present illness:  Jacob Pace is a 63 y.o. male, with history of advanced COPD which is predominantly emphysema on 4 L nasal cannula oxygen, [facility] care, ongoing smoking abuse counseled to quit smoking, comes to the ER with 12-14 hour history of dry cough along with progressive shortness of breath, he has chronic shortness of breath but it became acutely worse in the last 12 hours, denies any fever chills or body aches, no exposure to sick contacts, no recent travel, due to increased work of breathing he was feeling some chest pressure. Came to the ER as shortness of breath was getting worse in the ER his workup was consistent with acute on chronic respiratory failure due to COPD exacerbation, he briefly went into SVT in the ER received Cardizem and converted to sinus rhythm. He underwent CT angiogram of the chest which was unremarkable. I was called to admit the patient for COPD exacerbation with brief run of SVT.  He currently is feeling much better although still short of breath, mild chest pressure which is  improving, no other place except as above.   Hospital Course:  This patient was admitted to the hospital with COPD exacerbation. He has advanced COPD and is chronically on 4 L of oxygen. He was admitted with shortness of breath due to COPD exacerbation and started on empiric therapy with bronchodilators, antibiotics and intravenous steroids. The patient did have a brief run of supraventricular tachycardia the emergency room. He was seen by cardiology and was started on diltiazem. He did have one transient episode of SVT on 1/9. The patient was asymptomatic during this episode. It was recommended to continue him on calcium channel blockers. The patient will followup with cardiology as an outpatient to consider further titration of medications and possible ischemia evaluation. It appears the patient will be approaching his baseline respiratory status in the next 24 hours. Anticipate discharge home tomorrow with home health services.  Procedures: Echo:- Study data: Technically difficult. Very poor acoustic windows. - Left ventricle: The cavity size was normal. Wall thickness was normal. Systolic function was normal. The estimated ejection fraction was in the range of 60% to 65%. The study is not technically sufficient to allow evaluation of LV diastolic function. - Aortic valve: Mildly calcified annulus. Mildly thickened leaflets. Valve area: 2.66cm^2(VTI). Valve area: 2.05cm^2 (Vmax). - Left atrium: Grossly appears mildly enlarged. - Right ventricle: The right ventricle is poorly visualized. It appears moderately enlarged sharing the apex with the left ventricle. Systolic function appears grossly normal. - Right atrium: Not well visuazlied. Grossly appears mildly enlarged. - Pulmonary arteries: Systolic pressure was  moderately increased. PA peak pressure: 45mm Hg (S). - Systemic veins: The IVC is dilated with >50% respiratory variation, estimated RA pressure is 8  mmHg.    Consultations:  cardiology  Discharge Exam: Filed Vitals:   02/08/13 1940  BP: 118/76  Pulse: 63  Temp: 98 F (36.7 C)  Resp: 22    General: NAD Cardiovascular: s1, s2, rrr Respiratory: diminished breath sounds bilaterally  Discharge Instructions   Future Appointments Provider Department Dept Phone   02/28/2013 1:30 PM Jodelle GrossKathryn M Lawrence, NP [facility] [phone]       Medication List         albuterol (2.5 MG/3ML) 0.083% nebulizer solution  Commonly known as:  PROVENTIL  Take 6 mLs (5 mg total) by nebulization every 4 (four) hours as needed for wheezing or shortness of breath.     albuterol 108 (90 BASE) MCG/ACT inhaler  Commonly known as:  PROVENTIL HFA;VENTOLIN HFA  Inhale 2 puffs into the lungs every 6 (six) hours as needed for wheezing or shortness of breath.     aspirin 81 MG chewable tablet  Chew 1 tablet (81 mg total) by mouth daily.     atorvastatin 20 MG tablet  Commonly known as:  LIPITOR  Take 1 tablet (20 mg total) by mouth daily at 6 PM.     budesonide-formoterol 160-4.5 MCG/ACT inhaler  Commonly known as:  SYMBICORT  Inhale 2 puffs into the lungs 2 (two) times daily.     diltiazem 180 MG 24 hr capsule  Commonly known as:  CARDIZEM CD  Take 1 capsule (180 mg total) by mouth daily.     pantoprazole 40 MG tablet  Commonly known as:  PROTONIX  Take 1 tablet (40 mg total) by mouth daily.     predniSONE 10 MG tablet  Commonly known as:  DELTASONE  Take 40mg  po daily for 3 days then 30mg  po daily for 3 days then 20mg  po daily for 3 days then 10mg  po daily for 3 days then stop       No Known Allergies     Follow-up Information   Follow up with Joni ReiningKathryn Lawrence, NP On 02/28/2013. (1:30 pm)    Specialty:  Nurse Practitioner   Contact information:   [address] [place] [address] [phone]        The results of significant diagnostics from this hospitalization (including imaging, microbiology,  ancillary and laboratory) are listed below for reference.    Significant Diagnostic Studies: Ct Angio Chest Pe W/cm &/or Wo Cm  02/05/2013   CLINICAL DATA:  Shortness of breath.  EXAM: CT ANGIOGRAPHY CHEST WITH CONTRAST  TECHNIQUE: Multidetector CT imaging of the chest was performed using the standard protocol during bolus administration of intravenous contrast. Multiplanar CT image reconstructions including MIPs were obtained to evaluate the vascular anatomy.  CONTRAST:  100mL OMNIPAQUE IOHEXOL 350 MG/ML SOLN  COMPARISON:  Chest x-ray on 02/05/2013  FINDINGS: The pulmonary arteries are well opacified. There is no evidence of pulmonary embolism. The thoracic aorta is of normal caliber. Lungs show advanced emphysematous disease with large emphysematous bullae present in both upper lung zones. There is no evidence of pulmonary consolidation, edema, mass or pneumothorax. No pleural or pericardial fluid is identified.  The heart size is normal. No enlarged lymph nodes are identified. Bony structures are unremarkable.  Review of the MIP images confirms the above findings.  IMPRESSION: No evidence of pulmonary embolism or other acute findings. Advanced emphysematous lung disease present.  Electronically Signed   By: Irish Lack M.D.   On: 02/05/2013 15:24   Dg Chest Portable 1 View  02/05/2013   CLINICAL DATA:  Cough and congestion for a few days. Sided onset is severe shortness of breath this morning. History of emphysema.  EXAM: PORTABLE CHEST - 1 VIEW  COMPARISON:  10/09/2012, 02/17/2012 and 03/30/2007 chest x-ray. 02/28/2008 chest CT.  FINDINGS: Right apical parenchymal changes with bullae and pleural thickening similar to prior exams. No associated bony destruction.  Mild left apical pleural thickening stable.  Heart size top-normal.  Pulmonary vascular prominence most notable centrally superimposed upon chronic lung changes without findings of frank pulmonary edema.  No segmental infiltrate or gross  pneumothorax.  Calcified slightly tortuous aorta.  The patient would eventually benefit from two view chest.  IMPRESSION: Mild central pulmonary vascular prominence superimposed upon chronic changes as detailed above.   Electronically Signed   By: Bridgett Larsson M.D.   On: 02/05/2013 13:02    Microbiology: No results found for this or any previous visit (from the past 240 hour(s)).   Labs: Basic Metabolic Panel:  Recent Labs Lab 02/05/13 1212 02/05/13 2005 02/06/13 0523  NA 138  --  136*  K 4.2  --  4.0  CL 97  --  96  CO2 29  --  25  GLUCOSE 103*  --  147*  BUN 7  --  10  CREATININE 0.73 0.62 0.61  CALCIUM 8.8  --  9.4  MG  --   --  2.0   Liver Function Tests:  Recent Labs Lab 02/05/13 1212  AST 34  ALT 15  ALKPHOS 68  BILITOT 0.1*  PROT 7.6  ALBUMIN 3.3*   No results found for this basename: LIPASE, AMYLASE,  in the last 168 hours No results found for this basename: AMMONIA,  in the last 168 hours CBC:  Recent Labs Lab 02/05/13 1212 02/06/13 0523  WBC 13.6* 11.8*  NEUTROABS 9.7*  --   HGB 15.3 14.2  HCT 47.6 43.5  MCV 92.1 89.9  PLT 369 348   Cardiac Enzymes:  Recent Labs Lab 02/05/13 1212 02/05/13 2005 02/06/13 0111 02/06/13 0627  TROPONINI <0.30 <0.30 <0.30 <0.30   BNP: BNP (last 3 results)  Recent Labs  10/09/12 1355 02/05/13 1212  PROBNP 646.3* 2755.0*   CBG: No results found for this basename: GLUCAP,  in the last 168 hours     Signed:  MEMON,JEHANZEB  [facility] 02/08/2013, 8:43 PM

## 2013-02-08 NOTE — Progress Notes (Signed)
Consulting cardiologist:Bernardette Waldron  Subjective:    Feeling better, breathing improved.  Objective:   Temp:  [97.5 F (36.4 C)-98.4 F (36.9 C)] 97.5 F (36.4 C) (01/09 0528) Pulse Rate:  [64-125] 64 (01/09 0528) Resp:  [22-24] 22 (01/09 0528) BP: (106-110)/(66-83) 106/66 mmHg (01/09 0528) SpO2:  [93 %-98 %] 93 % (01/09 0710) Weight:  [175 lb 11.3 oz (79.7 kg)] 175 lb 11.3 oz (79.7 kg) (01/09 0500) Last BM Date: 02/06/13  Filed Weights   02/05/13 1831 02/06/13 0455 02/08/13 0500  Weight: 178 lb 12.7 oz (81.1 kg) 179 lb 0.2 oz (81.2 kg) 175 lb 11.3 oz (79.7 kg)    Intake/Output Summary (Last 24 hours) at 02/08/13 0841 Last data filed at 02/08/13 0500  Gross per 24 hour  Intake    723 ml  Output      0 ml  Net    723 ml    Telemetry: One episode of SVT.   Exam:  General: No acute distress.  HEENT: Conjunctiva and lids normal, oropharynx clear.  Lungs: Mild wheezes are noted. Remains on O2,.   Cardiac: No elevated JVP or bruits. RRR, no gallop or rub.   Abdomen: Normoactive bowel sounds, nontender, nondistended.  Extremities: No pitting edema, distal pulses full.  Neuropsychiatric: Alert and oriented x3, affect appropriate.He is having tremors.   Lab Results:  Basic Metabolic Panel:  Recent Labs Lab 02/05/13 1212 02/05/13 2005 02/06/13 0523  NA 138  --  136*  K 4.2  --  4.0  CL 97  --  96  CO2 29  --  25  GLUCOSE 103*  --  147*  BUN 7  --  10  CREATININE 0.73 0.62 0.61  CALCIUM 8.8  --  9.4  MG  --   --  2.0    Liver Function Tests:  Recent Labs Lab 02/05/13 1212  AST 34  ALT 15  ALKPHOS 68  BILITOT 0.1*  PROT 7.6  ALBUMIN 3.3*    CBC:  Recent Labs Lab 02/05/13 1212 02/06/13 0523  WBC 13.6* 11.8*  HGB 15.3 14.2  HCT 47.6 43.5  MCV 92.1 89.9  PLT 369 348    Cardiac Enzymes:  Recent Labs Lab 02/05/13 2005 02/06/13 0111 02/06/13 0627  TROPONINI <0.30 <0.30 <0.30    BNP:  Recent Labs  10/09/12 1355 02/05/13 1212   PROBNP 646.3* 2755.0*    Coagulation:  Recent Labs Lab 02/05/13 2005  INR 1.08      Medications:   Scheduled Medications: . aspirin  81 mg Oral Daily  . budesonide-formoterol  2 puff Inhalation BID  . diltiazem  90 mg Oral Q12H  . enoxaparin (LOVENOX) injection  40 mg Subcutaneous Q24H  . levalbuterol  0.63 mg Nebulization Q4H  . levofloxacin  500 mg Oral Daily  . methylPREDNISolone (SOLU-MEDROL) injection  80 mg Intravenous Q6H  . sodium chloride  3 mL Intravenous Q12H      PRN Medications: acetaminophen, acetaminophen, albuterol, alum & mag hydroxide-simeth, guaiFENesin-dextromethorphan, HYDROcodone-acetaminophen, ondansetron (ZOFRAN) IV, ondansetron, polyethylene glycol   Assessment and Plan:   1. PSVT:  Episode of SVT early morning hours, rate up to 140 bpm, asymtomatic. Now in NSR in the 80's, He is without symptoms and was unaware of increased HR. Will continue PO cardizem at 90 mg BID. Can consider long acting for ease of administration as OP. Continue ASA. Will need OP follow up with Dr.Lilliemae Fruge on discharge. Consideration for ischemic testing with OP stress test. Appt made with  our office on 02/28/2013.  2. COPD: Remains on O2. He is having tremors. Uncertain if from steroids, vs DT's with history of ETOH abuse. Breathing status improved.  3. Hypertension: Well controlled. No changes.  4. Hypercholesterolemia: TC 227, TG 101, HDL 82, LDL 125. Will start low dose statin, atorvastatin 20 mg daily.  5. Tobacco abuse: Cessation is strongly recommended with chronic lung disease.   Bettey Mare. Lyman Bishop NP [facility] 02/08/2013, 8:41 AM  Attending Note Patient seen and discussed with NP Lyman Bishop. Transient episode of SVT this morning that was asymptomatic. At this point would continue current dose of dilt in the setting of low normal blood pressures. If events become more frequent or long in duration likely titrate further at that time. Can also consider further  titration as outpatient if needed. COPD management per primary team.   Dina Rich MD

## 2013-02-09 MED ORDER — LEVALBUTEROL HCL 0.63 MG/3ML IN NEBU
INHALATION_SOLUTION | RESPIRATORY_TRACT | Status: AC
  Filled 2013-02-09: qty 3

## 2013-02-09 NOTE — Progress Notes (Signed)
Please see the discharge summary dictated by Dr. Kerry HoughMemon on 02/08/2013. Agree with no changes.  The patient was briefly seen and examined. His vital signs, laboratory studies, and chart were reviewed. His vital signs are stable. He is afebrile. COPD with exacerbation has resolved. No evidence of PSVT this morning. His lungs are clear, but grossly with decreased breath sounds. His heart has S1, S2, with no murmurs rubs or gallops. He is sitting up in the chair eating. He says that he feels much better. Discharge to home as previously recommended by Dr. Kerry HoughMemon.

## 2013-02-28 ENCOUNTER — Encounter: Payer: Self-pay | Admitting: Adult Health

## 2013-02-28 ENCOUNTER — Ambulatory Visit (INDEPENDENT_AMBULATORY_CARE_PROVIDER_SITE_OTHER): Payer: Medicare Other | Admitting: Adult Health

## 2013-02-28 VITALS — BP 145/92 | HR 74 | Ht 74.0 in | Wt 186.0 lb

## 2013-02-28 DIAGNOSIS — I471 Supraventricular tachycardia: Secondary | ICD-10-CM

## 2013-02-28 DIAGNOSIS — I10 Essential (primary) hypertension: Secondary | ICD-10-CM

## 2013-02-28 DIAGNOSIS — J441 Chronic obstructive pulmonary disease with (acute) exacerbation: Secondary | ICD-10-CM

## 2013-02-28 NOTE — Assessment & Plan Note (Signed)
He is without further complaints of racing heart rate. His EKG demonstrates NSR. He is tolerating the medications without dizziness of LEE. Will continue current medication regimen. Will follow up with Dr.Brach in 3 months.

## 2013-02-28 NOTE — Progress Notes (Signed)
    HPI: [name] is a 63 year old patient of Dr. Wyline MoodBranch for following posthospitalization in the setting of PSVT, with admission for chronic COPD with exacerbation, gastritis and acute bronchitis. During hospitalization in January 2015 the patient had an echocardiogram demonstrating normal 60 to 65% systolic function, IBC was dilated greater than 50% with respiratory variation. The patient was continued on oxygen, he is placed on calcium channel blockers, and is here for followup for ongoing management.   He comes today feeling well. He has QUIT SMOKING! Cold [place]. He is not noticing any heart racing or palpitations. Breathing status is a little better.He remains O2 dependent.          No Known Allergies  Current Outpatient Prescriptions  Medication Sig Dispense Refill  . albuterol (PROVENTIL HFA;VENTOLIN HFA) 108 (90 BASE) MCG/ACT inhaler Inhale 2 puffs into the lungs every 6 (six) hours as needed for wheezing or shortness of breath.      Marland Kitchen. albuterol (PROVENTIL) (2.5 MG/3ML) 0.083% nebulizer solution Take 6 mLs (5 mg total) by nebulization every 4 (four) hours as needed for wheezing or shortness of breath.  75 mL  12  . aspirin 81 MG chewable tablet Chew 1 tablet (81 mg total) by mouth daily.  30 tablet  1  . atorvastatin (LIPITOR) 20 MG tablet Take 1 tablet (20 mg total) by mouth daily at 6 PM.  30 tablet  1  . budesonide-formoterol (SYMBICORT) 160-4.5 MCG/ACT inhaler Inhale 2 puffs into the lungs 2 (two) times daily.        Marland Kitchen. diltiazem (CARDIZEM CD) 180 MG 24 hr capsule Take 1 capsule (180 mg total) by mouth daily.  30 capsule  1  . pantoprazole (PROTONIX) 40 MG tablet Take 1 tablet (40 mg total) by mouth daily.  30 tablet  1   No current facility-administered medications for this visit.    Past Medical History  Diagnosis Date  . COPD (chronic obstructive pulmonary disease)   . Emphysema   . Hypertension     Past Surgical History  Procedure Laterality Date  . Right inguinal  hernia repair  2007  . Hernia repair      BMW:UXLKGMROS:Review of systems complete and found to be negative unless listed above  PHYSICAL EXAM BP 145/92  Pulse 74  Ht 6\' 2"  (1.88 m)  Wt 186 lb (84.369 kg)  BMI 23.87 kg/m2  General: Well developed, well nourished, in no acute distress Head: Eyes PERRLA, No xanthomas.   Normal cephalic and atramatic  Lungs: Clear bilaterally to auscultation and percussion. Wearing 02.  Heart: HRRR S1 S2, without MRG.  Pulses are 2+ & equal.            No carotid bruit. No JVD.  No abdominal bruits. No femoral bruits. Abdomen: Bowel sounds are positive, abdomen soft and non-tender without masses or                  Hernia's noted. Msk:  Back normal, normal gait. Normal strength and tone for age. Extremities: No clubbing, cyanosis or edema.  DP +1 Neuro: Alert and oriented X 3. Tremors Psych:  Good affect, responds appropriately  EKG:  ASSESSMENT AND PLAN

## 2013-02-28 NOTE — Patient Instructions (Signed)
Your physician recommends that you schedule a follow-up appointment in: 3 months with Dr Branch You will receive a reminder letter two months in advance reminding you to call and schedule your appointment. If you don't receive this letter, please contact our office.  Your physician recommends that you continue on your current medications as directed. Please refer to the Current Medication list given to you today.   

## 2013-02-28 NOTE — Progress Notes (Signed)
Name: Jacob Pace    DOB: [birth year]  Age: 63 y.o.  MR#: [id number]       PCP:  No PCP Per Patient      Insurance: Payor: MEDICARE / Plan: MEDICARE PART A AND B / Product Type: *No Product type* /   CC:    Chief Complaint  Patient presents with  . Tachycardia    VS Filed Vitals:   02/28/13 1405  BP: 145/92  Pulse: 74  Height: 6\' 2"  (1.88 m)  Weight: 186 lb (84.369 kg)    Weights Current Weight  02/28/13 186 lb (84.369 kg)  02/09/13 186 lb 1.1 oz (84.4 kg)  10/09/12 205 lb (92.987 kg)    Blood Pressure  BP Readings from Last 3 Encounters:  02/28/13 145/92  02/09/13 105/67  10/09/12 116/92     Admit date:  (Not on file) Last encounter with RMR:  Visit date not found   Allergy Review of patient's allergies indicates no known allergies.  Current Outpatient Prescriptions  Medication Sig Dispense Refill  . albuterol (PROVENTIL HFA;VENTOLIN HFA) 108 (90 BASE) MCG/ACT inhaler Inhale 2 puffs into the lungs every 6 (six) hours as needed for wheezing or shortness of breath.      Marland Kitchen albuterol (PROVENTIL) (2.5 MG/3ML) 0.083% nebulizer solution Take 6 mLs (5 mg total) by nebulization every 4 (four) hours as needed for wheezing or shortness of breath.  75 mL  12  . aspirin 81 MG chewable tablet Chew 1 tablet (81 mg total) by mouth daily.  30 tablet  1  . atorvastatin (LIPITOR) 20 MG tablet Take 1 tablet (20 mg total) by mouth daily at 6 PM.  30 tablet  1  . budesonide-formoterol (SYMBICORT) 160-4.5 MCG/ACT inhaler Inhale 2 puffs into the lungs 2 (two) times daily.        Marland Kitchen diltiazem (CARDIZEM CD) 180 MG 24 hr capsule Take 1 capsule (180 mg total) by mouth daily.  30 capsule  1  . pantoprazole (PROTONIX) 40 MG tablet Take 1 tablet (40 mg total) by mouth daily.  30 tablet  1   No current facility-administered medications for this visit.    Discontinued Meds:    Medications Discontinued During This Encounter  Medication Reason  . predniSONE (DELTASONE) 10 MG tablet Error     Patient Active Problem List   Diagnosis Date Noted  . Acute-on-chronic respiratory failure 02/08/2013  . PSVT (paroxysmal supraventricular tachycardia) 02/07/2013  . Chest pain 02/07/2013  . COPD (chronic obstructive pulmonary disease) 02/05/2013  . COPD exacerbation 02/05/2013  . BRONCHITIS, ACUTE 04/02/2010  . CHRONIC OBSTRUCTIVE PULMONARY DISEASE, ACUTE EXACERBATION 03/27/2009  . GASTRITIS 04/17/2008  . GASTROINTESTINAL XRAY, ABNORMAL 04/11/2008  . EMPHYSEMA 06/15/2007  . Nonspecific (abnormal) findings on radiological and other examination of body structure 06/15/2007  . ABNORMAL CHEST XRAY 06/15/2007    LABS    Component Value Date/Time   NA 136* 02/06/2013 0523   NA 138 02/05/2013 1212   NA 127* 10/09/2012 1355   K 4.0 02/06/2013 0523   K 4.2 02/05/2013 1212   K 3.6 10/09/2012 1355   CL 96 02/06/2013 0523   CL 97 02/05/2013 1212   CL 84* 10/09/2012 1355   CO2 25 02/06/2013 0523   CO2 29 02/05/2013 1212   CO2 29 10/09/2012 1355   GLUCOSE 147* 02/06/2013 0523   GLUCOSE 103* 02/05/2013 1212   GLUCOSE 109* 10/09/2012 1355   BUN 10 02/06/2013 0523   BUN 7 02/05/2013 1212   BUN 3*  10/09/2012 1355   CREATININE 0.61 02/06/2013 0523   CREATININE 0.62 02/05/2013 2005   CREATININE 0.73 02/05/2013 1212   CALCIUM 9.4 02/06/2013 0523   CALCIUM 8.8 02/05/2013 1212   CALCIUM 9.6 10/09/2012 1355   GFRNONAA >90 02/06/2013 0523   GFRNONAA >90 02/05/2013 2005   GFRNONAA >90 02/05/2013 1212   GFRAA >90 02/06/2013 0523   GFRAA >90 02/05/2013 2005   GFRAA >90 02/05/2013 1212   CMP     Component Value Date/Time   NA 136* 02/06/2013 0523   K 4.0 02/06/2013 0523   CL 96 02/06/2013 0523   CO2 25 02/06/2013 0523   GLUCOSE 147* 02/06/2013 0523   BUN 10 02/06/2013 0523   CREATININE 0.61 02/06/2013 0523   CALCIUM 9.4 02/06/2013 0523   PROT 7.6 02/05/2013 1212   ALBUMIN 3.3* 02/05/2013 1212   AST 34 02/05/2013 1212   ALT 15 02/05/2013 1212   ALKPHOS 68 02/05/2013 1212   BILITOT 0.1* 02/05/2013 1212   GFRNONAA >90 02/06/2013 0523   GFRAA >90 02/06/2013 0523        Component Value Date/Time   WBC 11.8* 02/06/2013 0523   WBC 13.6* 02/05/2013 1212   WBC 7.1 10/09/2012 1355   HGB 14.2 02/06/2013 0523   HGB 15.3 02/05/2013 1212   HGB 16.8 10/09/2012 1355   HCT 43.5 02/06/2013 0523   HCT 47.6 02/05/2013 1212   HCT 47.0 10/09/2012 1355   MCV 89.9 02/06/2013 0523   MCV 92.1 02/05/2013 1212   MCV 87.9 10/09/2012 1355    Lipid Panel     Component Value Date/Time   CHOL 227* 02/08/2013 0525   TRIG 101 02/08/2013 0525   HDL 82 02/08/2013 0525   CHOLHDL 2.8 02/08/2013 0525   VLDL 20 02/08/2013 0525   LDLCALC 125* 02/08/2013 0525    ABG    Component Value Date/Time   PHART 7.452* 02/06/2013 0646   PCO2ART 40.5 02/06/2013 0646   PO2ART 89.6 02/06/2013 0646   HCO3 27.8* 02/06/2013 0646   TCO2 24.0 02/06/2013 0646   ACIDBASEDEF 3.2* 02/05/2013 1338   O2SAT 96.6 02/06/2013 0646     Lab Results  Component Value Date   TSH 0.611 02/05/2013   BNP (last 3 results)  Recent Labs  10/09/12 1355 02/05/13 1212  PROBNP 646.3* 2755.0*   Cardiac Panel (last 3 results) No results found for this basename: CKTOTAL, CKMB, TROPONINI, RELINDX,  in the last 72 hours  Iron/TIBC/Ferritin No results found for this basename: iron, tibc, ferritin     EKG Orders placed during the hospital encounter of 02/05/13  . ED EKG  . ED EKG  . ED EKG  . ED EKG  . EKG 12-LEAD  . EKG 12-LEAD  . EKG  . EKG 12-LEAD  . EKG 12-LEAD  . EKG 12-LEAD  . EKG 12-LEAD     Prior Assessment and Plan Problem List as of 02/28/2013     Cardiovascular and Mediastinum   PSVT (paroxysmal supraventricular tachycardia)     Respiratory   CHRONIC OBSTRUCTIVE PULMONARY DISEASE, ACUTE EXACERBATION   EMPHYSEMA   BRONCHITIS, ACUTE   COPD (chronic obstructive pulmonary disease)   COPD exacerbation   Acute-on-chronic respiratory failure     Digestive   GASTRITIS     Other   Nonspecific (abnormal) findings on radiological and other examination of body structure   GASTROINTESTINAL XRAY, ABNORMAL   ABNORMAL CHEST  XRAY   Chest pain       Imaging: Ct Angio Chest Pe W/cm &/  or Wo Cm  02/05/2013   CLINICAL DATA:  Shortness of breath.  EXAM: CT ANGIOGRAPHY CHEST WITH CONTRAST  TECHNIQUE: Multidetector CT imaging of the chest was performed using the standard protocol during bolus administration of intravenous contrast. Multiplanar CT image reconstructions including MIPs were obtained to evaluate the vascular anatomy.  CONTRAST:  100mL OMNIPAQUE IOHEXOL 350 MG/ML SOLN  COMPARISON:  Chest x-ray on 02/05/2013  FINDINGS: The pulmonary arteries are well opacified. There is no evidence of pulmonary embolism. The thoracic aorta is of normal caliber. Lungs show advanced emphysematous disease with large emphysematous bullae present in both upper lung zones. There is no evidence of pulmonary consolidation, edema, mass or pneumothorax. No pleural or pericardial fluid is identified.  The heart size is normal. No enlarged lymph nodes are identified. Bony structures are unremarkable.  Review of the MIP images confirms the above findings.  IMPRESSION: No evidence of pulmonary embolism or other acute findings. Advanced emphysematous lung disease present.   Electronically Signed   By: Irish LackGlenn  Yamagata M.D.   On: 02/05/2013 15:24   Dg Chest Portable 1 View  02/05/2013   CLINICAL DATA:  Cough and congestion for a few days. Sided onset is severe shortness of breath this morning. History of emphysema.  EXAM: PORTABLE CHEST - 1 VIEW  COMPARISON:  10/09/2012, 02/17/2012 and 03/30/2007 chest x-ray. 02/28/2008 chest CT.  FINDINGS: Right apical parenchymal changes with bullae and pleural thickening similar to prior exams. No associated bony destruction.  Mild left apical pleural thickening stable.  Heart size top-normal.  Pulmonary vascular prominence most notable centrally superimposed upon chronic lung changes without findings of frank pulmonary edema.  No segmental infiltrate or gross pneumothorax.  Calcified slightly tortuous aorta.  The patient  would eventually benefit from two view chest.  IMPRESSION: Mild central pulmonary vascular prominence superimposed upon chronic changes as detailed above.   Electronically Signed   By: Bridgett LarssonSteve  Olson M.D.   On: 02/05/2013 13:02

## 2013-02-28 NOTE — Assessment & Plan Note (Signed)
He continues O2 dependent. He is without wheezes or significant DOE. He has quit smoking. He is congratulated on this and is encouraged to continue the behavior.

## 2013-10-28 ENCOUNTER — Encounter: Payer: Self-pay | Admitting: Gastroenterology

## 2015-04-13 ENCOUNTER — Encounter (HOSPITAL_COMMUNITY): Payer: Self-pay | Admitting: Emergency Medicine

## 2015-04-13 ENCOUNTER — Emergency Department (HOSPITAL_COMMUNITY): Payer: Medicare Other

## 2015-04-13 ENCOUNTER — Emergency Department (HOSPITAL_COMMUNITY)
Admission: EM | Admit: 2015-04-13 | Discharge: 2015-04-13 | Disposition: A | Discharge status: Home / Self Care | Payer: Medicare Other | Attending: Emergency Medicine | Admitting: Emergency Medicine

## 2015-04-13 DIAGNOSIS — J449 Chronic obstructive pulmonary disease, unspecified: Secondary | ICD-10-CM | POA: Insufficient documentation

## 2015-04-13 DIAGNOSIS — K5900 Constipation, unspecified: Secondary | ICD-10-CM

## 2015-04-13 DIAGNOSIS — Z7982 Long term (current) use of aspirin: Secondary | ICD-10-CM | POA: Diagnosis not present

## 2015-04-13 DIAGNOSIS — I10 Essential (primary) hypertension: Secondary | ICD-10-CM | POA: Diagnosis not present

## 2015-04-13 DIAGNOSIS — Z79899 Other long term (current) drug therapy: Secondary | ICD-10-CM | POA: Insufficient documentation

## 2015-04-13 DIAGNOSIS — R911 Solitary pulmonary nodule: Secondary | ICD-10-CM | POA: Diagnosis not present

## 2015-04-13 DIAGNOSIS — R918 Other nonspecific abnormal finding of lung field: Secondary | ICD-10-CM

## 2015-04-13 MED ORDER — MAGNESIUM HYDROXIDE 400 MG/5ML PO SUSP
30.0000 mL | Freq: Once | ORAL | Status: AC
  Administered 2015-04-13: 30 mL via ORAL
  Filled 2015-04-13: qty 30

## 2015-04-13 MED ORDER — MINERAL OIL RE ENEM
1.0000 | ENEMA | Freq: Once | RECTAL | Status: AC
  Administered 2015-04-13: 1 via RECTAL

## 2015-04-13 NOTE — ED Notes (Signed)
Pt co constipation x 8 days. Denies n/v.

## 2015-04-13 NOTE — ED Provider Notes (Signed)
The patient is a 65 year old male, history of COPD, presents after not having a bowel movement for the last 8 days. Initially he had 2 stools when he used suppositories but has not had a stool since that time. He feels like he needs to go but cannot pass anything. He has been eating and drinking, he denies any increased coughing or new shortness of breath. He is on 4 L by nasal cannula chronically. On exam he has some distention of the abd but is non tender on my exam - he has mild wheezing but speaks in full sentences and states this is his pulmonary baseline.   x-ray showed small module,, followed up with CT scan, the patient will be informed of the results. He will receive some bowel regimen and be encouraged to follow-up with outpatient therapy. Anticipate daily stool softeners or MiraLAX until having daily soft stools. Patient is in agreement with the plan.  The pt had abnormal CT scan finding of likely Lung CA - pt informed of results and need fro f/u - is agreeable.  Medical screening examination/treatment/procedure(s) were conducted as a shared visit with non-physician practitioner(s) and myself.  I personally evaluated the patient during the encounter.  Clinical Impression:   Final diagnoses:  Constipation, unspecified constipation type  Lung mass         Eber HongBrian Alisha Bacus, MD 04/14/15 1125

## 2015-04-13 NOTE — ED Provider Notes (Signed)
CSN: [id number]     Arrival date & time 04/13/15  0802 History   First MD Initiated Contact with Patient 04/13/15 [phone]     Chief Complaint  Patient presents with  . Constipation     (Consider location/radiation/quality/duration/timing/severity/associated sxs/prior Treatment) Patient is a 65 y.o. male presenting with constipation. The history is provided by the patient.  Constipation Severity:  Moderate Time since last bowel movement:  8 days Timing:  Intermittent Progression:  Worsening Chronicity:  New Context: not dietary changes and not narcotics   Stool description:  Hard Relieved by:  Nothing Ineffective treatments:  None tried Associated symptoms: abdominal pain and flatus   Risk factors: no change in medication, no hx of abdominal surgery, no recent surgery and no recent travel     Past Medical History  Diagnosis Date  . COPD (chronic obstructive pulmonary disease) (HCC)   . Emphysema   . Hypertension    Past Surgical History  Procedure Laterality Date  . Right inguinal hernia repair  2007  . Hernia repair     Family History  Problem Relation Age of Onset  . Cancer Father     unsure what type  . Breast cancer Sister    Social History  Substance Use Topics  . Smoking status: Former Smoker -- 0.00 packs/day for 40 years  . Smokeless tobacco: None  . Alcohol Use: No     Comment: 3 weeks sober    Review of Systems  Respiratory: Positive for cough.   Gastrointestinal: Positive for abdominal pain, constipation and flatus.  All other systems reviewed and are negative.     Allergies  Review of patient's allergies indicates no known allergies.  Home Medications   Prior to Admission medications   Medication Sig Start Date End Date Taking? Authorizing Provider  albuterol (PROVENTIL HFA;VENTOLIN HFA) 108 (90 BASE) MCG/ACT inhaler Inhale 2 puffs into the lungs every 6 (six) hours as needed for wheezing or shortness of breath.    Historical Provider, MD   albuterol (PROVENTIL) (2.5 MG/3ML) 0.083% nebulizer solution Take 6 mLs (5 mg total) by nebulization every 4 (four) hours as needed for wheezing or shortness of breath. 02/08/13   Erick Blinks, MD  aspirin 81 MG chewable tablet Chew 1 tablet (81 mg total) by mouth daily. 02/08/13   Erick Blinks, MD  atorvastatin (LIPITOR) 20 MG tablet Take 1 tablet (20 mg total) by mouth daily at 6 PM. 02/08/13   Erick Blinks, MD  budesonide-formoterol (SYMBICORT) 160-4.5 MCG/ACT inhaler Inhale 2 puffs into the lungs 2 (two) times daily.      Historical Provider, MD  diltiazem (CARDIZEM CD) 180 MG 24 hr capsule Take 1 capsule (180 mg total) by mouth daily. 02/08/13   Erick Blinks, MD  pantoprazole (PROTONIX) 40 MG tablet Take 1 tablet (40 mg total) by mouth daily. 02/08/13   Erick Blinks, MD   BP 124/82 mmHg  Pulse 84  Temp(Src) 98.9 F (37.2 C)  Resp 18  Ht  (1.88 m)  Wt 96.163 kg  BMI 27.21 kg/m2  SpO2 98% Physical Exam  Constitutional: He is oriented to person, place, and time. He appears well-developed and well-nourished.  Non-toxic appearance.  HENT:  Head: Normocephalic.  Right Ear: Tympanic membrane and external ear normal.  Left Ear: Tympanic membrane and external ear normal.  Eyes: EOM and lids are normal. Pupils are equal, round, and reactive to light.  Neck: Normal range of motion. Neck supple. Carotid bruit is not present.  Cardiovascular: Normal  rate, regular rhythm, normal heart sounds, intact distal pulses and normal pulses.   Pulmonary/Chest: Breath sounds normal. No respiratory distress.  Abdominal: Soft. Bowel sounds are normal. He exhibits no distension, no fluid wave, no ascites and no pulsatile midline mass. There is no tenderness. There is no guarding.  Mild left upper and lower quad tenderness present. No guarding.  Musculoskeletal: Normal range of motion.  Lymphadenopathy:       Head (right side): No submandibular adenopathy present.       Head (left side): No  submandibular adenopathy present.    He has no cervical adenopathy.  Neurological: He is alert and oriented to person, place, and time. He has normal strength. No cranial nerve deficit or sensory deficit.  Skin: Skin is warm and dry.  Psychiatric: He has a normal mood and affect. His speech is normal.  Nursing note and vitals reviewed.   ED Course  Procedures (including critical care time) Labs Review Labs Reviewed - No data to display  Imaging Review No results found. I have personally reviewed and evaluated these images and lab results as part of my medical decision-making.   EKG Interpretation None      MDM Vital signs stable. Xray reveal diffuse stool burden. Pt also noted to have a nodule in the left mid lung. CT scan ordered.  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 Pt made aware of findings. He will [facility] with [facility]. Pt encouraged to increase fluids and fiber. Suggested using MOM and fleets mineral oil enema to help with constipation.   Final diagnoses:  None    **I have reviewed nursing notes, vital signs, and all appropriate lab and imaging results for this patient.Ivery Quale*    Rocsi Hazelbaker, PA-C 04/13/15 1950  Eber HongBrian Miller, MD 04/14/15 1124

## 2015-04-13 NOTE — ED Notes (Signed)
No stool from enema, only fluid returned.

## 2015-04-13 NOTE — Discharge Instructions (Signed)
Please increase water and juices. Please increase fiber in your diet. Use Fleets Mineral Oil enema  Every other day until constipation resolves. Use 2 tablespoons of Milk of Magnesia daily followed by a warm drink (tea, coffee, hot chocolate). Your CT scan suggest a nodule in the left lung that is concerning for possible cancer. Please see your [facility] as soon as possible concerning this. High-Fiber Diet Fiber, also called dietary fiber, is a type of carbohydrate found in fruits, vegetables, whole grains, and beans. A high-fiber diet can have many health benefits. Your health care provider may recommend a high-fiber diet to help:  Prevent constipation. Fiber can make your bowel movements more regular.  Lower your cholesterol.  Relieve hemorrhoids, uncomplicated diverticulosis, or irritable bowel syndrome.  Prevent overeating as part of a weight-loss plan.  Prevent heart disease, type 2 diabetes, and certain cancers. WHAT IS MY PLAN? The recommended daily intake of fiber includes:  38 grams for men under age 9.  30 grams for men over age 60.  25 grams for women under age 73.  21 grams for women over age 80. You can get the recommended daily intake of dietary fiber by eating a variety of fruits, vegetables, grains, and beans. Your health care provider may also recommend a fiber supplement if it is not possible to get enough fiber through your diet. WHAT DO I NEED TO KNOW ABOUT A HIGH-FIBER DIET?  Fiber supplements have not been widely studied for their effectiveness, so it is better to get fiber through food sources.  Always check the fiber content on thenutrition facts label of any prepackaged food. Look for foods that contain at least 5 grams of fiber per serving.  Ask your dietitian if you have questions about specific foods that are related to your condition, especially if those foods are not listed in the following section.  Increase your daily fiber consumption gradually.  Increasing your intake of dietary fiber too quickly may cause bloating, cramping, or gas.  Drink plenty of water. Water helps you to digest fiber. WHAT FOODS CAN I EAT? Grains Whole-grain breads. Multigrain cereal. Oats and oatmeal. Brown rice. Barley. Bulgur wheat. Millet. Bran muffins. Popcorn. Rye wafer crackers. Vegetables Sweet potatoes. Spinach. Kale. Artichokes. Cabbage. Broccoli. Green peas. Carrots. Squash. Fruits Berries. Pears. Apples. Oranges. Avocados. Prunes and raisins. Dried figs. Meats and Other Protein Sources Navy, kidney, pinto, and soy beans. Split peas. Lentils. Nuts and seeds. Dairy Fiber-fortified yogurt. Beverages Fiber-fortified soy milk. Fiber-fortified orange juice. Other Fiber bars. The items listed above may not be a complete list of recommended foods or beverages. Contact your dietitian for more options. WHAT FOODS ARE NOT RECOMMENDED? Grains White bread. Pasta made with refined flour. White rice. Vegetables Fried potatoes. Canned vegetables. Well-cooked vegetables.  Fruits Fruit juice. Cooked, strained fruit. Meats and Other Protein Sources Fatty cuts of meat. Fried Environmental education officer or fried fish. Dairy Milk. Yogurt. Cream cheese. Sour cream. Beverages Soft drinks. Other Cakes and pastries. Butter and oils. The items listed above may not be a complete list of foods and beverages to avoid. Contact your dietitian for more information. WHAT ARE SOME TIPS FOR INCLUDING HIGH-FIBER FOODS IN MY DIET?  Eat a wide variety of high-fiber foods.  Make sure that half of all grains consumed each day are whole grains.  Replace breads and cereals made from refined flour or white flour with whole-grain breads and cereals.  Replace white rice with brown rice, bulgur wheat, or millet.  Start the day with  a breakfast that is high in fiber, such as a cereal that contains at least 5 grams of fiber per serving.  Use beans in place of meat in soups, salads, or  pasta.  Eat high-fiber snacks, such as berries, raw vegetables, nuts, or popcorn.   This information is not intended to replace advice given to you by your health care provider. Make sure you discuss any questions you have with your health care provider.   Document Released: 01/17/2005 Document Revised: 02/07/2014 Document Reviewed: 07/02/2013 Elsevier Interactive Patient Education 2016 [employer]  Constipation, Adult Constipation is when a person:  Poops (has a bowel movement) less than 3 times a week.  Has a hard time pooping.  Has poop that is dry, hard, or bigger than normal. HOME CARE   Eat foods with a lot of fiber in them. This includes fruits, vegetables, beans, and whole grains such as brown rice.  Avoid fatty foods and foods with a lot of sugar. This includes french fries, hamburgers, cookies, candy, and soda.  If you are not getting enough fiber from food, take products with added fiber in them (supplements).  Drink enough fluid to keep your pee (urine) clear or pale yellow.  Exercise on a regular basis, or as told by your doctor.  Go to the restroom when you feel like you need to poop. Do not hold it.  Only take medicine as told by your doctor. Do not take medicines that help you poop (laxatives) without talking to your doctor first. GET HELP RIGHT AWAY IF:   You have bright red blood in your poop (stool).  Your constipation lasts more than 4 days or gets worse.  You have belly (abdominal) or butt (rectal) pain.  You have thin poop (as thin as a pencil).  You lose weight, and it cannot be explained. MAKE SURE YOU:   Understand these instructions.  Will watch your condition.  Will get help right away if you are not doing well or get worse.   This information is not intended to replace advice given to you by your health care provider. Make sure you discuss any questions you have with your health care provider.   Document Released: 07/06/2007 Document  Revised: 02/07/2014 Document Reviewed: 10/29/2012 Elsevier Interactive Patient Education [employer] Elsevier Inc.

## 2015-04-13 NOTE — ED Notes (Signed)
constipated for last 8 days.  Have not tried any OTC med.  [facility] [place]

## 2018-07-31 ENCOUNTER — Encounter: Payer: Self-pay | Admitting: *Deleted

## 2018-07-31 ENCOUNTER — Other Ambulatory Visit: Payer: Self-pay

## 2018-07-31 ENCOUNTER — Ambulatory Visit (INDEPENDENT_AMBULATORY_CARE_PROVIDER_SITE_OTHER): Payer: No Typology Code available for payment source | Admitting: Emergency Medicine

## 2018-07-31 DIAGNOSIS — J449 Chronic obstructive pulmonary disease, unspecified: Secondary | ICD-10-CM | POA: Diagnosis not present

## 2018-07-31 NOTE — Assessment & Plan Note (Signed)
Please continue Spiriva and Symbicort as you have been taking it.  Remember to rinse and gargle after using the Symbicort. We will work on trying to substitute Ventolin for your generic albuterol since it is more effective.  You can use 2 puffs up to every 4 hours if needed for shortness of breath, chest tightness, wheezing. Please continue your oxygen at all times, 4 L/min We will obtain your prior CT scans of the chest from the [facility] for review.  We can discuss the timing of any necessary repeat scans at your next visit. Get your flu shot in the fall Pneumonia shot is up-to-date per [facility] records Follow with Dr Lamonte Sakai in 6 months or sooner if you have any problems

## 2018-07-31 NOTE — Progress Notes (Signed)
Subjective:    Patient ID: Jacob Pace, male    DOB: Mar 07, 1950, 68 y.o.   MRN: [id number]  HPI 68 year old former smoker (50 pack years) with a history of hypertension, COPD with emphysema.  He has been followed before the [place], had pulmonary function testing in 2012 that showed very severe airflow limitation and decreased diffusion capacity.  There is also some suspicion of secondary pulmonary hypertension due to this and also suspected obstructive sleep apnea.  He has had pulmonary nodular disease that is been followed with serial CT scan imaging.  Based on notes stable for 3 years on serial CT   He is referred today for further management of his COPD. He is using Spiriva, Symbicort. He uses albuterol prn, about 4-6x a day (HFA or nebulized). He has had AE twice in the last 10 yrs. Able to do household chores, uses O2 at 4L/min. ? Possible OSA > he doesn't snore, no witnessed apneas. Minimal sputum or cough. He does hear wheeze in the am. Sometimes awakened at night. Uses 4L/min at all times. PNA shots are UTD, gets the flu shot reliably.    Review of Systems  Constitutional: Negative for fever and unexpected weight change.  HENT: Negative for congestion, dental problem, ear pain, nosebleeds, postnasal drip, rhinorrhea, sinus pressure, sneezing, sore throat and trouble swallowing.   Eyes: Negative for redness and itching.  Respiratory: Positive for shortness of breath and wheezing. Negative for cough and chest tightness.   Cardiovascular: Negative for palpitations and leg swelling.  Gastrointestinal: Negative for nausea and vomiting.  Genitourinary: Negative for dysuria.  Musculoskeletal: Negative for joint swelling.  Skin: Negative for rash.  Neurological: Negative for headaches.  Hematological: Does not bruise/bleed easily.  Psychiatric/Behavioral: Negative for dysphoric mood. The patient is not nervous/anxious.     Past Medical History:  Diagnosis Date  . COPD (chronic  obstructive pulmonary disease) ([place])   . Emphysema   . Hypertension      Family History  Problem Relation Age of Onset  . Cancer Father        unsure what type  . Breast cancer Sister      Social History   Socioeconomic History  . Marital status: Legally Separated    Spouse name: Not on file  . Number of children: Not on file  . Years of education: Not on file  . Highest education level: Not on file  Occupational History  . Occupation: Engineer, materials  Social Needs  . Financial resource strain: Not on file  . Food insecurity    Worry: Not on file    Inability: Not on file  . Transportation needs    Medical: Not on file    Non-medical: Not on file  Tobacco Use  . Smoking status: Former Smoker    Packs/day: 1.00    Years: 40.00    Pack years: 40.00    Quit date: 02/01/2008    Years since quitting: 10.5  . Smokeless tobacco: Never Used  Substance and Sexual Activity  . 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 Drug use: No  . Sexual activity: Never  Lifestyle  . Physical activity    Days per week: Not on file    Minutes per session: Not on file  . Stress: Not on file  Relationships  . Social Herbalist on phone: Not on file    Gets together: Not on file    Attends religious service:  Not on file    Active member of club or organization: Not on file    Attends meetings of clubs or organizations: Not on file    Relationship status: Not on file  . Intimate partner violence    Fear of current or ex partner: Not on file    Emotionally abused: Not on file    Physically abused: Not on file    Forced sexual activity: Not on file  Other Topics Concern  . Not on file  Social History Narrative   seperated with children         He was in the [employer], was in [place] until 1982, probable asbestos exposure. ? Agent Orange.  Has lived in [place], [place], [place]   No Known Allergies   Outpatient Medications Prior to Visit  Medication Sig Dispense Refill   . albuterol (PROVENTIL HFA;VENTOLIN HFA) 108 (90 BASE) MCG/ACT inhaler Inhale 2 puffs into the lungs every 6 (six) hours as needed for wheezing or shortness of breath.    Marland Kitchen. albuterol (PROVENTIL) (2.5 MG/3ML) 0.083% nebulizer solution Take 6 mLs (5 mg total) by nebulization every 4 (four) hours as needed for wheezing or shortness of breath. 75 mL 12  . budesonide-formoterol (SYMBICORT) 160-4.5 MCG/ACT inhaler Inhale 2 puffs into the lungs 2 (two) times daily.      Marland Kitchen. diltiazem (CARDIZEM CD) 180 MG 24 hr capsule Take 1 capsule (180 mg total) by mouth daily. 30 capsule 1  . tiotropium (SPIRIVA) 18 MCG inhalation capsule Place 18 mcg into inhaler and inhale daily.    Marland Kitchen. sulfamethoxazole-trimethoprim (BACTRIM DS,SEPTRA DS) 800-160 MG tablet Take 1 tablet by mouth 2 (two) times daily.     No facility-administered medications prior to visit.         Objective:   Physical Exam  Vitals:   07/31/18 1424  BP: (!) 142/90  Pulse: 72  SpO2: 100%  Weight: 229 lb (103.9 kg)  Height: 6\' 2"  (1.88 m)   Gen: Pleasant, elderly man, in no distress,  normal affect  ENT: No lesions,  mouth clear,  oropharynx clear, no postnasal drip  Neck: No JVD, no stridor  Lungs: No use of accessory muscles, distant, no crackles or wheezing on normal respiration, no wheeze on forced expiration  Cardiovascular: RRR, heart sounds normal, no murmur or gallops, no peripheral edema  Musculoskeletal: No deformities, no cyanosis or clubbing  Neuro: alert, awake, non focal  Skin: Warm, no lesions or rash      Assessment & Plan:  COPD (chronic obstructive pulmonary disease) Please continue Spiriva and Symbicort as you have been taking it.  Remember to rinse and gargle after using the Symbicort. We will work on trying to substitute Ventolin for your generic albuterol since it is more effective.  You can use 2 puffs up to every 4 hours if needed for shortness of breath, chest tightness, wheezing. Please continue your  oxygen at all times, 4 L/min We will [facility].  We can discuss the timing of any necessary repeat scans at your next visit. Get your flu shot in the fall Pneumonia [facility] Follow with Dr Delton CoombesByrum in 6 months or sooner if you have any problems  Levy Pupaobert Byrum, MD, PhD 07/31/2018, 3:11 PM [facility] [phone] or if no answer [phone]

## 2018-07-31 NOTE — Patient Instructions (Signed)
Please continue Spiriva and Symbicort as you have been taking it.  Remember to rinse and gargle after using the Symbicort. We will work on trying to substitute Ventolin for your generic albuterol since it is more effective.  You can use 2 puffs up to every 4 hours if needed for shortness of breath, chest tightness, wheezing. Please continue your oxygen at all times, 4 L/min We [facility]  We can discuss the timing of any necessary repeat scans at your next visit. Get your flu shot in the fall Pneumonia shot is [facility] Follow with Dr Byrum in 6 months or sooner if you have any problems 

## 2018-10-16 ENCOUNTER — Other Ambulatory Visit: Payer: Self-pay | Admitting: Family

## 2018-10-16 ENCOUNTER — Other Ambulatory Visit (HOSPITAL_COMMUNITY): Payer: Self-pay | Admitting: Family

## 2018-10-16 DIAGNOSIS — Z122 Encounter for screening for malignant neoplasm of respiratory organs: Secondary | ICD-10-CM

## 2018-10-16 DIAGNOSIS — Z0189 Encounter for other specified special examinations: Secondary | ICD-10-CM

## 2018-10-19 ENCOUNTER — Emergency Department (HOSPITAL_COMMUNITY)
Admission: EM | Admit: 2018-10-19 | Discharge: 2018-10-19 | Disposition: A | Discharge status: Home / Self Care | Payer: Medicare Other | Attending: Emergency Medicine | Admitting: Emergency Medicine

## 2018-10-19 ENCOUNTER — Encounter (HOSPITAL_COMMUNITY): Payer: Self-pay | Admitting: Emergency Medicine

## 2018-10-19 ENCOUNTER — Emergency Department (HOSPITAL_COMMUNITY): Payer: Medicare Other

## 2018-10-19 ENCOUNTER — Other Ambulatory Visit: Payer: Self-pay

## 2018-10-19 DIAGNOSIS — R0602 Shortness of breath: Secondary | ICD-10-CM | POA: Diagnosis not present

## 2018-10-19 DIAGNOSIS — I10 Essential (primary) hypertension: Secondary | ICD-10-CM | POA: Insufficient documentation

## 2018-10-19 DIAGNOSIS — Z9981 Dependence on supplemental oxygen: Secondary | ICD-10-CM | POA: Insufficient documentation

## 2018-10-19 DIAGNOSIS — J449 Chronic obstructive pulmonary disease, unspecified: Secondary | ICD-10-CM | POA: Insufficient documentation

## 2018-10-19 DIAGNOSIS — R51 Headache: Secondary | ICD-10-CM | POA: Diagnosis not present

## 2018-10-19 DIAGNOSIS — Z87891 Personal history of nicotine dependence: Secondary | ICD-10-CM | POA: Diagnosis not present

## 2018-10-19 DIAGNOSIS — R040 Epistaxis: Secondary | ICD-10-CM | POA: Insufficient documentation

## 2018-10-19 LAB — CBC WITH DIFFERENTIAL/PLATELET
Abs Immature Granulocytes: 0.05 10*3/uL (ref 0.00–0.07)
Basophils Absolute: 0.1 10*3/uL (ref 0.0–0.1)
Basophils Relative: 0 %
Eosinophils Absolute: 0.2 10*3/uL (ref 0.0–0.5)
Eosinophils Relative: 1 %
HCT: 47.2 % (ref 39.0–52.0)
Hemoglobin: 14 g/dL (ref 13.0–17.0)
Immature Granulocytes: 0 %
Lymphocytes Relative: 12 %
Lymphs Abs: 1.4 10*3/uL (ref 0.7–4.0)
MCH: 27.9 pg (ref 26.0–34.0)
MCHC: 29.7 g/dL — ABNORMAL LOW (ref 30.0–36.0)
MCV: 94.2 fL (ref 80.0–100.0)
Monocytes Absolute: 0.7 10*3/uL (ref 0.1–1.0)
Monocytes Relative: 5 %
Neutro Abs: 9.6 10*3/uL — ABNORMAL HIGH (ref 1.7–7.7)
Neutrophils Relative %: 82 %
Platelets: 243 10*3/uL (ref 150–400)
RBC: 5.01 MIL/uL (ref 4.22–5.81)
RDW: 13.7 % (ref 11.5–15.5)
WBC: 11.9 10*3/uL — ABNORMAL HIGH (ref 4.0–10.5)
nRBC: 0 % (ref 0.0–0.2)

## 2018-10-19 LAB — TYPE AND SCREEN
ABO/RH(D): A POS
Antibody Screen: NEGATIVE

## 2018-10-19 LAB — BASIC METABOLIC PANEL
Anion gap: 10 (ref 5–15)
BUN: 12 mg/dL (ref 8–23)
CO2: 28 mmol/L (ref 22–32)
Calcium: 9 mg/dL (ref 8.9–10.3)
Chloride: 96 mmol/L — ABNORMAL LOW (ref 98–111)
Creatinine, Ser: 0.82 mg/dL (ref 0.61–1.24)
GFR calc Af Amer: >60 mL/min (ref >60)
GFR calc non Af Amer: >60 mL/min (ref >60)
Glucose, Bld: 92 mg/dL (ref 70–99)
Potassium: 4.1 mmol/L (ref 3.5–5.1)
Sodium: 134 mmol/L — ABNORMAL LOW (ref 135–145)

## 2018-10-19 MED ORDER — PREDNISONE 10 MG PO TABS: 40.0000 mg | ORAL_TABLET | Freq: Every day | ORAL | 0 refills | Status: AC

## 2018-10-19 MED ORDER — SODIUM CHLORIDE 0.9 % IV BOLUS
500.0000 mL | Freq: Once | INTRAVENOUS | Status: AC
  Administered 2018-10-19: 17:00:00 500 mL via INTRAVENOUS

## 2018-10-19 MED ORDER — OXYMETAZOLINE HCL 0.05 % NA SOLN
1.0000 | Freq: Once | NASAL | Status: AC
  Administered 2018-10-19: 17:00:00 1 via NASAL
  Filled 2018-10-19: qty 30

## 2018-10-19 MED ORDER — DOXYCYCLINE HYCLATE 100 MG PO CAPS: 100.0000 mg | ORAL_CAPSULE | Freq: Two times a day (BID) | ORAL | 0 refills | Status: AC

## 2018-10-19 MED ORDER — ALBUTEROL SULFATE HFA 108 (90 BASE) MCG/ACT IN AERS
2.0000 | INHALATION_SPRAY | Freq: Once | RESPIRATORY_TRACT | Status: AC
  Administered 2018-10-19: 2 via RESPIRATORY_TRACT
  Filled 2018-10-19: qty 6.7

## 2018-10-19 NOTE — ED Provider Notes (Signed)
[facility] EMERGENCY DEPARTMENT Provider Note   CSN: [id number] Arrival date & time: 10/19/18  1322     History   Chief Complaint Chief Complaint  Patient presents with  . Epistaxis    HPI Jacob Pace is a 68 y.o. male with history of hypertension, COPD on chronic oxygen (4L) who presents with epistaxis, which has been recurring for the past month.  Patient has had 2 episodes today.  He is also noticed some intermittent lightheadedness, worse with standing.  He is also noticed that he is taking a little longer to recover from shortness of breath on exertion for the past 2 months.  He denies any chest pain.  He has noticed some cough lately.  He denies any fever, abdominal pain, nausea, vomiting.  He also notes of a headache on the top of his head and in his temples for the past couple months that has been intermittent. He has not seen ENT in the past for his nose bleeds.     HPI  Past Medical History:  Diagnosis Date  . COPD (chronic obstructive pulmonary disease) (HCC)   . Emphysema   . Hypertension     Patient Active Problem List   Diagnosis Date Noted  . Acute-on-chronic respiratory failure (HCC) 02/08/2013  . PSVT (paroxysmal supraventricular tachycardia) (HCC) 02/07/2013  . COPD (chronic obstructive pulmonary disease) (HCC) 02/05/2013  . COPD exacerbation (HCC) 02/05/2013  . BRONCHITIS, ACUTE 04/02/2010  . CHRONIC OBSTRUCTIVE PULMONARY DISEASE, ACUTE EXACERBATION 03/27/2009  . GASTRITIS 04/17/2008  . GASTROINTESTINAL XRAY, ABNORMAL 04/11/2008  . EMPHYSEMA 06/15/2007  . Nonspecific (abnormal) findings on radiological and other examination of body structure 06/15/2007  . ABNORMAL CHEST XRAY 06/15/2007    Past Surgical History:  Procedure Laterality Date  . HERNIA REPAIR    . right inguinal hernia repair  2007        Home Medications    Prior to Admission medications   Medication Sig Start Date End Date Taking? Authorizing Provider  albuterol (PROVENTIL  HFA;VENTOLIN HFA) 108 (90 BASE) MCG/ACT inhaler Inhale 2 puffs into the lungs every 6 (six) hours as needed for wheezing or shortness of breath.    [provider]  albuterol (PROVENTIL) (2.5 MG/3ML) 0.083% nebulizer solution Take 6 mLs (5 mg total) by nebulization every 4 (four) hours as needed for wheezing or shortness of breath. 02/08/13   Erick BlinksMemon, Jehanzeb, MD  budesonide-formoterol (SYMBICORT) 160-4.5 MCG/ACT inhaler Inhale 2 puffs into the lungs 2 (two) times daily.      [provider]  diltiazem (CARDIZEM CD) 180 MG 24 hr capsule Take 1 capsule (180 mg total) by mouth daily. 02/08/13   Erick BlinksMemon, Jehanzeb, MD  doxycycline (VIBRAMYCIN) 100 MG capsule Take 1 capsule (100 mg total) by mouth 2 (two) times daily for 7 days. 10/19/18 10/26/18  Emi HolesLaw, Mekhia Brogan M, PA-C  predniSONE (DELTASONE) 10 MG tablet Take 4 tablets (40 mg total) by mouth daily with breakfast for 5 days. 10/19/18 10/24/18  Emi HolesLaw, Dontavia Brand M, PA-C  tiotropium (SPIRIVA) 18 MCG inhalation capsule Place 18 mcg into inhaler and inhale daily.    [provider]    Family History Family History  Problem Relation Age of Onset  . Cancer Father        unsure what type  . Breast cancer Sister     Social History Social History   Tobacco Use  . Smoking status: Former Smoker    Packs/day: 1.00    Years: 40.00    Pack years: 40.00  Quit date: 02/01/2008    Years since quitting: 10.7  . Smokeless tobacco: Never Used  Substance Use Topics  . Alcohol use: Yes    Comment: weekends  . Drug use: No     Allergies   Patient has no known allergies.   Review of Systems Review of Systems  Constitutional: Negative for chills and fever.  HENT: Positive for nosebleeds. Negative for facial swelling and sore throat.   Respiratory: Positive for cough and shortness of breath.   Cardiovascular: Negative for chest pain.  Gastrointestinal: Negative for abdominal pain, nausea and vomiting.  Genitourinary: Negative for  dysuria.  Musculoskeletal: Negative for back pain.  Skin: Negative for rash and wound.  Neurological: Positive for light-headedness and headaches. Negative for syncope.  Psychiatric/Behavioral: The patient is not nervous/anxious.      Physical Exam Updated Vital Signs BP (!) 156/93   Pulse 77   Temp 98.3 F (36.8 C) (Oral)   Resp (!) 21   SpO2 99%   Physical Exam Vitals signs and nursing note reviewed.  Constitutional:      General: He is not in acute distress.    Appearance: He is well-developed. He is not diaphoretic.  HENT:     Head: Normocephalic and atraumatic.     Comments: No swelling, erythema, or tenderness over temporal region Patient point to the crown of his head and reports a little tenderness over a scaly dry area in the scalp, no erythema or drainage    Nose:     Right Nostril: Epistaxis present.     Left Nostril: No epistaxis.     Mouth/Throat:     Pharynx: No oropharyngeal exudate.  Eyes:     General: No scleral icterus.       Right eye: No discharge.        Left eye: No discharge.     Conjunctiva/sclera: Conjunctivae normal.     Pupils: Pupils are equal, round, and reactive to light.  Neck:     Musculoskeletal: Normal range of motion and neck supple.     Thyroid: No thyromegaly.  Cardiovascular:     Rate and Rhythm: Normal rate and regular rhythm.     Heart sounds: Normal heart sounds. No murmur. No friction rub. No gallop.   Pulmonary:     Effort: Pulmonary effort is normal. No respiratory distress.     Breath sounds: Normal breath sounds. No stridor. No wheezing or rales.  Abdominal:     General: Bowel sounds are normal. There is no distension.     Palpations: Abdomen is soft.     Tenderness: There is no abdominal tenderness. There is no guarding or rebound.  Lymphadenopathy:     Cervical: No cervical adenopathy.  Skin:    General: Skin is warm and dry.     Coloration: Skin is not pale.     Findings: No rash.  Neurological:     Mental  Status: He is alert.     Coordination: Coordination normal.     Comments: CN 3-12 intact; normal sensation throughout; 5/5 strength in all 4 extremities; equal bilateral grip strength      ED Treatments / Results  Labs (all labs ordered are listed, but only abnormal results are displayed) Labs Reviewed  BASIC METABOLIC PANEL - Abnormal; Notable for the following components:      Result Value   Sodium 134 (*)    Chloride 96 (*)    All other components within normal limits  CBC WITH DIFFERENTIAL/PLATELET -  Abnormal; Notable for the following components:   WBC 11.9 (*)    MCHC 29.7 (*)    Neutro Abs 9.6 (*)    All other components within normal limits  TYPE AND SCREEN    EKG EKG Interpretation  Date/Time:  Friday October 19 2018 18:01:06 EDT Ventricular Rate:  76 PR Interval:    QRS Duration: 105 QT Interval:  390 QTC Calculation: 439 R Axis:   70 Text Interpretation:  Sinus rhythm Low voltage, extremity leads Nonspecific T abnormalities, lateral leads Baseline wander Artifact When compared with ECG of 02/07/2013 No acute changes Poor data quality, interpretation may be adversely affected Confirmed by Samuel JesterMcManus, Kathleen [id number]) on 10/19/2018 6:17:39 PM   Radiology Ct Head Wo Contrast  Result Date: 10/19/2018 CLINICAL DATA:  Headache. EXAM: CT HEAD WITHOUT CONTRAST TECHNIQUE: Contiguous axial images were obtained from the base of the skull through the vertex without intravenous contrast. COMPARISON:  None. FINDINGS: Brain: No hydrocephalus. No mass, hemorrhage, edema or other evidence of acute parenchymal abnormality. No extra-axial hemorrhage. Vascular: Chronic calcified atherosclerotic changes of the large vessels at the skull base. No unexpected hyperdense vessel. Skull: Normal. Negative for fracture or focal lesion. Sinuses/Orbits: No fluid or evidence of acute mucosal thickening within the visualized upper paranasal sinuses. Chronic mucous retention cyst in the LEFT maxillary  sinus. Fluid within the bilateral mastoid air cells, LEFT greater than RIGHT, and LEFT middle ear cavity, likely chronic. Periorbital and retro-orbital soft tissues are unremarkable. Other: None. IMPRESSION: 1. No acute intracranial abnormality. No intracranial mass, hemorrhage or edema. 2. Fluid within the bilateral mastoid air cells, LEFT greater than RIGHT, and LEFT middle ear cavity, of uncertain age but most likely chronic. Electronically Signed   By: Bary RichardStan  Maynard M.D.   On: 10/19/2018 17:30   Dg Chest Portable 1 View  Result Date: 10/19/2018 CLINICAL DATA:  Cough, shortness of breath EXAM: PORTABLE CHEST 1 VIEW COMPARISON:  02/05/2013 FINDINGS: The heart size and mediastinal contours are within normal limits. Emphysema. The visualized skeletal structures are unremarkable. IMPRESSION: Emphysema without acute abnormality of the lungs. Electronically Signed   By: Lauralyn PrimesAlex  Bibbey M.D.   On: 10/19/2018 17:30    Procedures Procedures (including critical care time)  Medications Ordered in ED Medications  sodium chloride 0.9 % bolus 500 mL (0 mLs Intravenous Stopped 10/19/18 1835)  oxymetazoline (AFRIN) 0.05 % nasal spray 1 spray (1 spray Each Nare Given 10/19/18 1653)  albuterol (VENTOLIN HFA) 108 (90 Base) MCG/ACT inhaler 2 puff (2 puffs Inhalation Given 10/19/18 1900)     Initial Impression / Assessment and Plan / ED Course  I have reviewed the triage vital signs and the nursing notes.  Pertinent labs & imaging results that were available during my care of the patient were reviewed by me and considered in my medical decision making (see chart for details).        Patient presenting with recurrent epistaxis as well as ongoing shortness of breath over the past 2 months that is worse than his baseline.  He states it is just taking a little longer to recover.  He is not requiring more oxygen.  He is chronically on 4 L.  His chest x-ray shows emphysema without acute abnormalities.  Will treat with  5-day prednisone burst and considering new cough, 1 week of doxycycline.  CT head shows some chronic sinusitis.  Patient's epistaxis is controlled in the ED, given Afrin.  Advised he can use Afrin after pressure at home as well, but not  to use it more than 2 days in a row.  Will give follow-up to ENT as well as PCP for recheck and further evaluation of progressively worsening shortness of breath.  Suspect headaches may be related to epistaxis.  Return precautions discussed.  Patient understands and agrees with plan.  Patient vitals stable throughout ED course and discharged in satisfactory condition. I discussed patient case with Dr. Clarene Duke who guided the patient's management and agrees with plan.   Final Clinical Impressions(s) / ED Diagnoses   Final diagnoses:  Epistaxis, recurrent  Shortness of breath    ED Discharge Orders         Ordered    predniSONE (DELTASONE) 10 MG tablet  Daily with breakfast     10/19/18 1858    doxycycline (VIBRAMYCIN) 100 MG capsule  2 times daily     10/19/18 1858           Emi Holes, PA-C 10/19/18 1905    Samuel Jester, DO 10/20/18 1601

## 2018-10-19 NOTE — ED Notes (Signed)
Patient transported to CT 

## 2018-10-19 NOTE — Discharge Instructions (Signed)
If you have a nosebleed that is slowing down, you can use 1 spray of Afrin after getting the clots out of your nose.  Hold pressure for at least 15 minutes prior to this.  Take prednisone for 5 days as well as doxycycline for 7 days for suspected COPD exacerbation.  Please follow-up with your doctor as soon as possible for further evaluation and treatment of your increased shortness of breath and recurrent headaches.  Your headaches may be related to your nosebleeds.  Make sure to stay well-hydrated.  Please return the emergency department if you develop any new or worsening symptoms.

## 2018-10-19 NOTE — ED Triage Notes (Signed)
Patient reports recurring epistaxis x 1 month. Has had 2 episodes today. Patient also states he has had progressive SOB with exertion. History of COPD, on O2 @ 4L.

## 2018-10-29 ENCOUNTER — Other Ambulatory Visit: Payer: Self-pay

## 2018-10-29 ENCOUNTER — Ambulatory Visit (HOSPITAL_COMMUNITY)
Admission: RE | Admit: 2018-10-29 | Discharge: 2018-10-29 | Disposition: A | Discharge status: Home / Self Care | Payer: No Typology Code available for payment source | Source: Ambulatory Visit | Attending: Family | Admitting: Family

## 2018-10-29 DIAGNOSIS — Z0189 Encounter for other specified special examinations: Secondary | ICD-10-CM | POA: Diagnosis present

## 2019-11-13 ENCOUNTER — Ambulatory Visit: Payer: No Typology Code available for payment source | Admitting: Emergency Medicine

## 2019-11-19 ENCOUNTER — Ambulatory Visit: Payer: No Typology Code available for payment source | Admitting: Urology

## 2020-02-03 ENCOUNTER — Observation Stay (HOSPITAL_COMMUNITY)
Admission: EM | Admit: 2020-02-03 | Discharge: 2020-02-04 | Disposition: A | Discharge status: Home / Self Care | Payer: No Typology Code available for payment source | Attending: Internal Medicine | Admitting: Internal Medicine

## 2020-02-03 ENCOUNTER — Emergency Department (HOSPITAL_COMMUNITY): Payer: No Typology Code available for payment source

## 2020-02-03 ENCOUNTER — Encounter (HOSPITAL_COMMUNITY): Payer: Self-pay

## 2020-02-03 ENCOUNTER — Other Ambulatory Visit: Payer: Self-pay

## 2020-02-03 DIAGNOSIS — J440 Chronic obstructive pulmonary disease with acute lower respiratory infection: Secondary | ICD-10-CM | POA: Diagnosis present

## 2020-02-03 DIAGNOSIS — Z79899 Other long term (current) drug therapy: Secondary | ICD-10-CM | POA: Diagnosis not present

## 2020-02-03 DIAGNOSIS — Z9981 Dependence on supplemental oxygen: Secondary | ICD-10-CM | POA: Insufficient documentation

## 2020-02-03 DIAGNOSIS — J441 Chronic obstructive pulmonary disease with (acute) exacerbation: Secondary | ICD-10-CM | POA: Diagnosis not present

## 2020-02-03 DIAGNOSIS — I10 Essential (primary) hypertension: Secondary | ICD-10-CM

## 2020-02-03 DIAGNOSIS — J9611 Chronic respiratory failure with hypoxia: Secondary | ICD-10-CM

## 2020-02-03 DIAGNOSIS — Z20822 Contact with and (suspected) exposure to covid-19: Secondary | ICD-10-CM | POA: Diagnosis not present

## 2020-02-03 DIAGNOSIS — Z87891 Personal history of nicotine dependence: Secondary | ICD-10-CM | POA: Diagnosis not present

## 2020-02-03 DIAGNOSIS — R0602 Shortness of breath: Secondary | ICD-10-CM | POA: Diagnosis present

## 2020-02-03 DIAGNOSIS — Z23 Encounter for immunization: Secondary | ICD-10-CM | POA: Diagnosis not present

## 2020-02-03 LAB — CBC WITH DIFFERENTIAL/PLATELET
Abs Immature Granulocytes: 0.05 10*3/uL (ref 0.00–0.07)
Basophils Absolute: 0.1 10*3/uL (ref 0.0–0.1)
Basophils Relative: 0 %
Eosinophils Absolute: 0.4 10*3/uL (ref 0.0–0.5)
Eosinophils Relative: 3 %
HCT: 43.1 % (ref 39.0–52.0)
Hemoglobin: 13.2 g/dL (ref 13.0–17.0)
Immature Granulocytes: 0 %
Lymphocytes Relative: 21 %
Lymphs Abs: 2.8 10*3/uL (ref 0.7–4.0)
MCH: 28.1 pg (ref 26.0–34.0)
MCHC: 30.6 g/dL (ref 30.0–36.0)
MCV: 91.7 fL (ref 80.0–100.0)
Monocytes Absolute: 1.1 10*3/uL — ABNORMAL HIGH (ref 0.1–1.0)
Monocytes Relative: 8 %
Neutro Abs: 9.2 10*3/uL — ABNORMAL HIGH (ref 1.7–7.7)
Neutrophils Relative %: 68 %
Platelets: 261 10*3/uL (ref 150–400)
RBC: 4.7 MIL/uL (ref 4.22–5.81)
RDW: 15 % (ref 11.5–15.5)
WBC: 13.5 10*3/uL — ABNORMAL HIGH (ref 4.0–10.5)
nRBC: 0 % (ref 0.0–0.2)

## 2020-02-03 LAB — BASIC METABOLIC PANEL
Anion gap: 9 (ref 5–15)
BUN: 12 mg/dL (ref 8–23)
CO2: 31 mmol/L (ref 22–32)
Calcium: 9 mg/dL (ref 8.9–10.3)
Chloride: 98 mmol/L (ref 98–111)
Creatinine, Ser: 0.78 mg/dL (ref 0.61–1.24)
GFR, Estimated: >60 mL/min (ref >60)
Glucose, Bld: 89 mg/dL (ref 70–99)
Potassium: 3.9 mmol/L (ref 3.5–5.1)
Sodium: 138 mmol/L (ref 135–145)

## 2020-02-03 LAB — RESP PANEL BY RT-PCR (FLU A&B, COVID) ARPGX2
Influenza A by PCR: NEGATIVE
Influenza B by PCR: NEGATIVE
SARS Coronavirus 2 by RT PCR: NEGATIVE

## 2020-02-03 LAB — TROPONIN I (HIGH SENSITIVITY)
Troponin I (High Sensitivity): 5 ng/L (ref <18)
Troponin I (High Sensitivity): 4 ng/L (ref <18)

## 2020-02-03 LAB — BRAIN NATRIURETIC PEPTIDE: B Natriuretic Peptide: 38 pg/mL (ref 0.0–100.0)

## 2020-02-03 MED ORDER — AEROCHAMBER PLUS FLO-VU MISC
1.0000 | Freq: Once | Status: DC
  Filled 2020-02-03: qty 1

## 2020-02-03 MED ORDER — IPRATROPIUM BROMIDE 0.02 % IN SOLN
0.5000 mg | Freq: Once | RESPIRATORY_TRACT | Status: AC
  Administered 2020-02-03: 0.5 mg via RESPIRATORY_TRACT
  Filled 2020-02-03: qty 2.5

## 2020-02-03 MED ORDER — LEVOFLOXACIN IN D5W 500 MG/100ML IV SOLN
500.0000 mg | INTRAVENOUS | Status: DC
  Administered 2020-02-03: 500 mg via INTRAVENOUS
  Filled 2020-02-03: qty 100

## 2020-02-03 MED ORDER — ALBUTEROL SULFATE HFA 108 (90 BASE) MCG/ACT IN AERS
8.0000 | INHALATION_SPRAY | Freq: Once | RESPIRATORY_TRACT | Status: AC
  Administered 2020-02-03: 8 via RESPIRATORY_TRACT
  Filled 2020-02-03: qty 6.7

## 2020-02-03 MED ORDER — ALBUTEROL (5 MG/ML) CONTINUOUS INHALATION SOLN
10.0000 mg/h | INHALATION_SOLUTION | Freq: Once | RESPIRATORY_TRACT | Status: AC
  Administered 2020-02-03: 10 mg/h via RESPIRATORY_TRACT
  Filled 2020-02-03: qty 20

## 2020-02-03 MED ORDER — MAGNESIUM SULFATE 2 GM/50ML IV SOLN
2.0000 g | Freq: Once | INTRAVENOUS | Status: AC
  Administered 2020-02-03: 2 g via INTRAVENOUS
  Filled 2020-02-03: qty 50

## 2020-02-03 NOTE — ED Notes (Signed)
MD at the bedside  
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
## 2020-02-03 NOTE — Progress Notes (Signed)
Pharmacy Antibiotic Note  Jacob Pace is a 70 y.o. male admitted on 02/03/2020 with COPD exacerbation.  Pharmacy has been consulted for Levofloxacin dosing.    Afebrile, WBC 13.5, good renal function.  Plan:   Levofloxacin 500 mg IV q24hr.  Follow progress, length of therapy.   Height: 6\' 2"  (188 cm) Weight: 102.5 kg (226 lb) IBW/kg (Calculated) : 82.2  Temp (24hrs), Avg:97.9 F (36.6 C), Min:97.9 F (36.6 C), Max:97.9 F (36.6 C)  Recent Labs  Lab 02/03/20 1559  WBC 13.5*  CREATININE 0.78    Estimated Creatinine Clearance: 109.7 mL/min (by C-G formula based on SCr of 0.78 mg/dL).    No Known Allergies  Antimicrobials this admission:  Levofloxacin 1/3>>  Dose adjustments this admission:  n/a  Microbiology results:  1/3 COVID and flu: negative  Thank you for allowing pharmacy to be a part of this patient's care.  04/02/20, Dennie Fetters 02/03/2020 9:18 PM

## 2020-02-03 NOTE — H&P (Addendum)
[facility] History & Physical  Jacob Moselle MD  Jacob Pace 02/03/2020  Chief Complaint: SOB, cough, wheezing HPI: The patient is a 70 y.o. year-old w/ hx of COPD and HTN presented to ED w/ SOB for 2-3 days, getting worse. Lost power at home today and has been using portable O2 tanks. He is on 4L O2 at home x 10 yrs. Tried albuterol inhaler 4 times today w/o improvement. EMS gave albuterol x 3 and solumedrol 125 mg IV w/ partial improvement. Pt states sputum has changed to yellow-green color as well.  In ED BP's are normal, RR 23- 37, HR 75- 85, on 4L [place] at 97% sat.  Labs are all normal except for WBC 13K.  CXR shows emphysema w/o infiltrates or other acute findings.  In ED pt rec'd 1 hr neb, albuterol hfa 8 puffs, atrovent neb and MgSO4 2gm x 1. Pt has not improving sufficiently and needs admission. Asked to see for admission.   Pt seen in ED #8. Not in distress, having mild ^wob.  Denies any chest pain, abd pain, fevers, chills, back pain or n/v/d.  No dysuria or hx kidney disease , no hx heart disease.   Troponin and BNP were wnl. All labs normal except for WBC 13K.   Lives alone, quit smoking yrs ago.  Occ etoh.    ROS  denies CP  no joint pain   no HA  no blurry vision  no rash  no diarrhea  no nausea/ vomiting  no dysuria  no difficulty voiding  no change in urine color   Past Medical History  Past Medical History:  Diagnosis Date  . COPD (chronic obstructive pulmonary disease) (HCC)   . Emphysema   . Hypertension    Past Surgical History  Past Surgical History:  Procedure Laterality Date  . HERNIA REPAIR    . right inguinal hernia repair  2007   Family History  Family History  Problem Relation Age of Onset  . Cancer Father        unsure what type  . Breast cancer Sister    Social History  reports that he quit smoking about 12 years ago. He has a 40.00 pack-year smoking history. He has never used smokeless tobacco. He reports current alcohol use. He  reports that he does not use drugs. Allergies No Known Allergies Home medications Prior to Admission medications   Medication Sig Start Date End Date Taking? Authorizing Provider  albuterol (PROVENTIL HFA;VENTOLIN HFA) 108 (90 BASE) MCG/ACT inhaler Inhale 2 puffs into the lungs every 6 (six) hours as needed for wheezing or shortness of breath.   Yes [provider]  albuterol (PROVENTIL) (2.5 MG/3ML) 0.083% nebulizer solution Take 6 mLs (5 mg total) by nebulization every 4 (four) hours as needed for wheezing or shortness of breath. 02/08/13  Yes Erick Blinks, MD  budesonide-formoterol (SYMBICORT) 160-4.5 MCG/ACT inhaler Inhale 2 puffs into the lungs 2 (two) times daily.   Yes [provider]  diltiazem (CARDIZEM CD) 180 MG 24 hr capsule Take 1 capsule (180 mg total) by mouth daily. 02/08/13  Yes Erick Blinks, MD  tiotropium (SPIRIVA) 18 MCG inhalation capsule Place 18 mcg into inhaler and inhale daily.   Yes [provider]       Exam Gen alert, nasal O2 4 L, mild ^wob No rash, cyanosis or gangrene Sclera anicteric, throat clear  No jvd or bruits Chest post air movement is poor, anterior is better, moderate diffuse Exp wheezing  RRR no MRG Abd soft ntnd no mass or ascites +bs GU normal male MS no joint effusions or deformity Ext edema, no wounds or ulcers Neuro is alert, Ox 3 , nf    Home meds:  - sybicort 2 bid/ spiriva 18ug qd/ albuterol hfa prn/ albuterol nebs qid prn  - cardizem cd 180 qd  - prn's/ vitamins/ supplements      EKG - Likely sinus rhythm with artifact. Low voltage, extremity and precordial leads.  Probable anteroseptal infarct, old. Interpretation limited secondary to artifact    CXR -  IMPRESSION: No focal consolidation. Emphysema      Na 138  K 3.9  CO2 31  BN 12  Cr 0.78      WBC 13 K   Hb 13  plt 261       COVID negative   Assessment/ Plan: 1. COPD exacerbation - pt w/ chron resp failure on 4L O2 at home > 10 yrs. O2 support  here is same at home. Partial improvement w/ nebs, IV steroids and IV Mg per EMS/ ED.  Needs admission, cont IV solumedrol, start Duoneb w/ prn albuterol nebs , O2 support and IV abx w/ levaquin.  Admit to telemetry bed.  2. HTN - cont cardizem cd      Jacob Moselle  MD 02/03/2020, 8:33 PM

## 2020-02-04 DIAGNOSIS — J441 Chronic obstructive pulmonary disease with (acute) exacerbation: Secondary | ICD-10-CM | POA: Diagnosis not present

## 2020-02-04 LAB — HIV ANTIBODY (ROUTINE TESTING W REFLEX): HIV Screen 4th Generation wRfx: NONREACTIVE

## 2020-02-04 MED ORDER — PREDNISONE 20 MG PO TABS: 40.0000 mg | ORAL_TABLET | Freq: Every day | ORAL | 0 refills | Status: AC

## 2020-02-04 MED ORDER — INFLUENZA VAC A&B SA ADJ QUAD 0.5 ML IM PRSY: 0.5000 mL | PREFILLED_SYRINGE | INTRAMUSCULAR | Status: DC

## 2020-02-04 MED ORDER — DILTIAZEM HCL ER COATED BEADS 180 MG PO CP24
180.0000 mg | ORAL_CAPSULE | Freq: Every day | ORAL | Status: DC
  Administered 2020-02-04: 180 mg via ORAL
  Filled 2020-02-04: qty 1

## 2020-02-04 MED ORDER — KCL IN DEXTROSE-NACL 20-5-0.45 MEQ/L-%-% IV SOLN: INTRAVENOUS | Status: DC

## 2020-02-04 MED ORDER — IPRATROPIUM-ALBUTEROL 0.5-2.5 (3) MG/3ML IN SOLN
3.0000 mL | Freq: Four times a day (QID) | RESPIRATORY_TRACT | Status: DC
  Administered 2020-02-04: 3 mL via RESPIRATORY_TRACT
  Filled 2020-02-04: qty 3

## 2020-02-04 MED ORDER — ONDANSETRON HCL 4 MG/2ML IJ SOLN: 4.0000 mg | Freq: Four times a day (QID) | INTRAMUSCULAR | Status: DC | PRN

## 2020-02-04 MED ORDER — ACETAMINOPHEN 325 MG PO TABS
650.0000 mg | ORAL_TABLET | Freq: Four times a day (QID) | ORAL | Status: DC | PRN
  Administered 2020-02-04: 650 mg via ORAL
  Filled 2020-02-04: qty 2

## 2020-02-04 MED ORDER — ACETAMINOPHEN 650 MG RE SUPP: 650.0000 mg | Freq: Four times a day (QID) | RECTAL | Status: DC | PRN

## 2020-02-04 MED ORDER — METHYLPREDNISOLONE SODIUM SUCC 125 MG IJ SOLR
60.0000 mg | Freq: Three times a day (TID) | INTRAMUSCULAR | Status: DC
  Administered 2020-02-04: 60 mg via INTRAVENOUS
  Filled 2020-02-04: qty 2

## 2020-02-04 MED ORDER — POLYETHYLENE GLYCOL 3350 17 G PO PACK: 17.0000 g | PACK | Freq: Every day | ORAL | Status: DC | PRN

## 2020-02-04 MED ORDER — ENOXAPARIN SODIUM 40 MG/0.4ML ~~LOC~~ SOLN
40.0000 mg | SUBCUTANEOUS | Status: DC
  Administered 2020-02-04: 40 mg via SUBCUTANEOUS
  Filled 2020-02-04: qty 0.4

## 2020-02-04 MED ORDER — GUAIFENESIN ER 600 MG PO TB12
600.0000 mg | ORAL_TABLET | Freq: Two times a day (BID) | ORAL | Status: DC
  Administered 2020-02-04: 600 mg via ORAL
  Filled 2020-02-04: qty 1

## 2020-02-04 MED ORDER — COVID-19 AD26 VACCINE(JANSSEN) 0.5 ML IM SUSP
0.5000 mL | Freq: Once | INTRAMUSCULAR | Status: AC
  Administered 2020-02-04: 0.5 mL via INTRAMUSCULAR
  Filled 2020-02-04: qty 0.5

## 2020-02-04 MED ORDER — COVID-19 AD26 VACCINE(JANSSEN) 0.5 ML IM SUSP
0.5000 mL | Freq: Once | INTRAMUSCULAR | Status: DC
  Filled 2020-02-04: qty 0.5

## 2020-02-04 MED ORDER — ONDANSETRON HCL 4 MG PO TABS: 4.0000 mg | ORAL_TABLET | Freq: Four times a day (QID) | ORAL | Status: DC | PRN

## 2020-02-04 MED ORDER — GUAIFENESIN ER 600 MG PO TB12: 600.0000 mg | ORAL_TABLET | Freq: Two times a day (BID) | ORAL | 0 refills | Status: AC

## 2020-02-04 MED ORDER — ALBUTEROL SULFATE (2.5 MG/3ML) 0.083% IN NEBU: 2.5000 mg | INHALATION_SOLUTION | RESPIRATORY_TRACT | Status: DC | PRN

## 2020-02-04 NOTE — Discharge Summary (Signed)
Physician Discharge Summary  Jacob Pace SEG:[id number] DOB: [birth year] DOA: 02/03/2020  PCP: System, Provider Not In  Admit date: 02/03/2020  Discharge date: 02/04/2020  Admitted From:Home  Disposition:  Home  Recommendations for Outpatient Follow-up:  1. Follow up with PCP in 1-2 weeks 2. Follow-up with pulmonology at the [facility] in 1-2 weeks 3. Continue on home breathing treatments as needed for shortness of breath or wheezing 4. Continue on prednisone as prescribed for several more days  Home Health: None  Equipment/Devices: Has home nasal cannula oxygen, chronically on 4 L  Discharge Condition:Stable  CODE STATUS: Full  Diet recommendation: Heart Healthy  Brief/Interim Summary: Per HPI: The patient is a 70 y.o. year-old w/ hx of COPD and HTN presented to ED w/ SOB for 2-3 days, getting worse. Lost power at home today and has been using portable O2 tanks. He is on 4L O2 at home x 10 yrs. Tried albuterol inhaler 4 times today w/o improvement. EMS gave albuterol x 3 and solumedrol 125 mg IV w/ partial improvement. Pt states sputum has changed to yellow-green color as well.  In ED BP's are normal, RR 23- 37, HR 75- 85, on 4L [place] at 97% sat.  Labs are all normal except for WBC 13K.  CXR shows emphysema w/o infiltrates or other acute findings.  In ED pt rec'd 1 hr neb, albuterol hfa 8 puffs, atrovent neb and MgSO4 2gm x 1. Pt has not improving sufficiently and needs admission. Asked to see for admission.   Pt seen in ED #8. Not in distress, having mild ^wob.  Denies any chest pain, abd pain, fevers, chills, back pain or n/v/d.  No dysuria or hx kidney disease , no hx heart disease.   Troponin and BNP were wnl. All labs normal except for WBC 13K.   Lives alone, quit smoking yrs ago.  Occ etoh  -Patient was admitted with acute COPD exacerbation and remains in his usual 4 L nasal cannula oxygen.  He denies any further cough, wheezing, or shortness of breath and states that overall he  feels much better and is ready for discharge today.  He has received some Solu-Medrol as well as Levaquin and breathing treatments which have helped him considerably.  His Covid and flu testing have been negative.  He is requesting a Covid booster shot today which will be given.  I have encouraged him to get the flu shot in the very near future to follow-up with his pulmonologist.  No other acute issues noted throughout the course of this admission.  Is stable for discharge.  Discharge Diagnoses:  Principal Problem:   CHRONIC OBSTRUCTIVE PULMONARY DISEASE, ACUTE EXACERBATION Active Problems:   Chronic respiratory failure with hypoxia, on home O2 therapy (HCC)   Essential hypertension   COPD with acute exacerbation (HCC)  Principal discharge diagnosis: Acute COPD exacerbation-resolved.  Discharge Instructions  Discharge Instructions    Diet - low sodium heart healthy   Complete by: As directed    Increase activity slowly   Complete by: As directed      Allergies as of 02/04/2020   No Known Allergies     Medication List    TAKE these medications   albuterol (2.5 MG/3ML) 0.083% nebulizer solution Commonly known as: PROVENTIL Take 6 mLs (5 mg total) by nebulization every 4 (four) hours as needed for wheezing or shortness of breath.   albuterol 108 (90 Base) MCG/ACT inhaler Commonly known as: VENTOLIN HFA Inhale 2 puffs into the lungs every 6 (  six) hours as needed for wheezing or shortness of breath.   budesonide-formoterol 160-4.5 MCG/ACT inhaler Commonly known as: SYMBICORT Inhale 2 puffs into the lungs 2 (two) times daily.   diltiazem 180 MG 24 hr capsule Commonly known as: CARDIZEM CD Take 1 capsule (180 mg total) by mouth daily.   guaiFENesin 600 MG 12 hr tablet Commonly known as: MUCINEX Take 1 tablet (600 mg total) by mouth 2 (two) times daily for 7 days.   predniSONE 20 MG tablet Commonly known as: Deltasone Take 2 tablets (40 mg total) by mouth daily for 5 days.    tiotropium 18 MCG inhalation capsule Commonly known as: SPIRIVA Place 18 mcg into inhaler and inhale daily.       Follow-up Information    pcp and pulmonologist at [facility]. Schedule an appointment as soon as possible for a visit in 1 week(s).              No Known Allergies  Consultations:  None   Procedures/Studies: DG Chest Portable 1 View  Result Date: 02/03/2020 CLINICAL DATA:  cough, COPD EXAM: PORTABLE CHEST 1 VIEW COMPARISON:  10/19/2018 and prior. FINDINGS: Emphysema. No pneumothorax or pleural effusion. No focal consolidation. Stable cardiomediastinal silhouette. No acute osseous abnormality. IMPRESSION: No focal consolidation.  Emphysema Electronically Signed   By: Stana Bunting M.D.   On: 02/03/2020 16:30      Discharge Exam: Vitals:   02/04/20 0821 02/04/20 0900  BP:  122/77  Pulse:  (!) 101  Resp:  (!) 21  Temp:  97.8 F (36.6 C)  SpO2: 97% 98%   Vitals:   02/04/20 0100 02/04/20 0524 02/04/20 0821 02/04/20 0900  BP: 125/81 116/67  122/77  Pulse: 88 84  (!) 101  Resp: (!) 28 (!) 21  (!) 21  Temp: 98 F (36.7 C) 98.2 F (36.8 C)  97.8 F (36.6 C)  TempSrc: Oral Oral  Oral  SpO2: 99% 98% 97% 98%  Weight:      Height:        General: Pt is alert, awake, not in acute distress Cardiovascular: RRR, S1/S2 +, no rubs, no gallops Respiratory: CTA bilaterally, no wheezing, no rhonchi, on 4 L nasal cannula oxygen Abdominal: Soft, NT, ND, bowel sounds + Extremities: no edema, no cyanosis    The results of significant diagnostics from this hospitalization (including imaging, microbiology, ancillary and laboratory) are listed below for reference.     Microbiology: Recent Results (from the past 240 hour(s))  Resp Panel by RT-PCR (Flu A&B, Covid) Nasopharyngeal Swab     Status: None   Collection Time: 02/03/20  3:46 PM   Specimen: Nasopharyngeal Swab; Nasopharyngeal(NP) swabs in vial transport medium  Result Value Ref Range Status   SARS  Coronavirus 2 by RT PCR NEGATIVE NEGATIVE Final    Comment: (NOTE) SARS-CoV-2 target nucleic acids are NOT DETECTED.  The SARS-CoV-2 RNA is generally detectable in upper respiratory specimens during the acute phase of infection. The lowest concentration of SARS-CoV-2 viral copies this assay can detect is 138 copies/mL. A negative result does not preclude SARS-Cov-2 infection and should not be used as the sole basis for treatment or other patient management decisions. A negative result may occur with  improper specimen collection/handling, submission of specimen other than nasopharyngeal swab, presence of viral mutation(s) within the areas targeted by this assay, and inadequate number of viral copies(<138 copies/mL). A negative result must be combined with clinical observations, patient history, and epidemiological information. The expected result is Negative.  Fact Sheet for Patients:  EntrepreneurPulse.com.au  Fact Sheet for Healthcare Providers:  IncredibleEmployment.be  This test is no t yet approved or cleared by the [place] FDA and  has been authorized for detection and/or diagnosis of SARS-CoV-2 by FDA under an Emergency Use Authorization (EUA). This EUA will remain  in effect (meaning this test can be used) for the duration of the COVID-19 declaration under Section 564(b)(1) of the Act, 21 U.S.C.section 360bbb-3(b)(1), unless the authorization is terminated  or revoked sooner.       Influenza A by PCR NEGATIVE NEGATIVE Final   Influenza B by PCR NEGATIVE NEGATIVE Final    Comment: (NOTE) The Xpert Xpress SARS-CoV-2/FLU/RSV plus assay is intended as an aid in the diagnosis of influenza from Nasopharyngeal swab specimens and should not be used as a sole basis for treatment. Nasal washings and aspirates are unacceptable for Xpert Xpress SARS-CoV-2/FLU/RSV testing.  Fact Sheet for  Patients: EntrepreneurPulse.com.au  Fact Sheet for Healthcare Providers: IncredibleEmployment.be  This test is not yet approved or cleared by the [place] FDA and has been authorized for detection and/or diagnosis of SARS-CoV-2 by FDA under an Emergency Use Authorization (EUA). This EUA will remain in effect (meaning this test can be used) for the duration of the COVID-19 declaration under Section 564(b)(1) of the Act, 21 U.S.C. section 360bbb-3(b)(1), unless the authorization is terminated or revoked.  Performed at [facility], [address] [place], [place] [address]      Labs: BNP (last 3 results) Recent Labs    02/03/20 1559  BNP 96.2   Basic Metabolic Panel: Recent Labs  Lab 02/03/20 1559  NA 138  K 3.9  CL 98  CO2 31  GLUCOSE 89  BUN 12  CREATININE 0.78  CALCIUM 9.0   Liver Function Tests: No results for input(s): AST, ALT, ALKPHOS, BILITOT, PROT, ALBUMIN in the last 168 hours. No results for input(s): LIPASE, AMYLASE in the last 168 hours. No results for input(s): AMMONIA in the last 168 hours. CBC: Recent Labs  Lab 02/03/20 1559  WBC 13.5*  NEUTROABS 9.2*  HGB 13.2  HCT 43.1  MCV 91.7  PLT 261   Cardiac Enzymes: No results for input(s): CKTOTAL, CKMB, CKMBINDEX, TROPONINI in the last 168 hours. BNP: Invalid input(s): POCBNP CBG: No results for input(s): GLUCAP in the last 168 hours. D-Dimer No results for input(s): DDIMER in the last 72 hours. Hgb A1c No results for input(s): HGBA1C in the last 72 hours. Lipid Profile No results for input(s): CHOL, HDL, LDLCALC, TRIG, CHOLHDL, LDLDIRECT in the last 72 hours. Thyroid function studies No results for input(s): TSH, T4TOTAL, T3FREE, THYROIDAB in the last 72 hours.  Invalid input(s): FREET3 Anemia work up No results for input(s): VITAMINB12, FOLATE, FERRITIN, TIBC, IRON, RETICCTPCT in the last 72 hours. Urinalysis No results found for: COLORURINE,  APPEARANCEUR, [place], [place], GLUCOSEU, [place], [place], [place], PROTEINUR, UROBILINOGEN, NITRITE, LEUKOCYTESUR Sepsis Labs Invalid input(s): PROCALCITONIN,  WBC,  LACTICIDVEN Microbiology Recent Results (from the past 240 hour(s))  Resp Panel by RT-PCR (Flu A&B, Covid) Nasopharyngeal Swab     Status: None   Collection Time: 02/03/20  3:46 PM   Specimen: Nasopharyngeal Swab; Nasopharyngeal(NP) swabs in vial transport medium  Result Value Ref Range Status   SARS Coronavirus 2 by RT PCR NEGATIVE NEGATIVE Final    Comment: (NOTE) SARS-CoV-2 target nucleic acids are NOT DETECTED.  The SARS-CoV-2 RNA is generally detectable in upper respiratory specimens during the acute phase of infection. The lowest concentration of  SARS-CoV-2 viral copies this assay can detect is 138 copies/mL. A negative result does not preclude SARS-Cov-2 infection and should not be used as the sole basis for treatment or other patient management decisions. A negative result may occur with  improper specimen collection/handling, submission of specimen other than nasopharyngeal swab, presence of viral mutation(s) within the areas targeted by this assay, and inadequate number of viral copies(<138 copies/mL). A negative result must be combined with clinical observations, patient history, and epidemiological information. The expected result is Negative.  Fact Sheet for Patients:  BloggerCourse.com  Fact Sheet for Healthcare Providers:  SeriousBroker.it  This test is no t yet approved or cleared by the [place] FDA and  has been authorized for detection and/or diagnosis of SARS-CoV-2 by FDA under an Emergency Use Authorization (EUA). This EUA will remain  in effect (meaning this test can be used) for the duration of the COVID-19 declaration under Section 564(b)(1) of the Act, 21 U.S.C.section 360bbb-3(b)(1), unless the authorization is terminated  or  revoked sooner.       Influenza A by PCR NEGATIVE NEGATIVE Final   Influenza B by PCR NEGATIVE NEGATIVE Final    Comment: (NOTE) The Xpert Xpress SARS-CoV-2/FLU/RSV plus assay is intended as an aid in the diagnosis of influenza from Nasopharyngeal swab specimens and should not be used as a sole basis for treatment. Nasal washings and aspirates are unacceptable for Xpert Xpress SARS-CoV-2/FLU/RSV testing.  Fact Sheet for Patients: BloggerCourse.com  Fact Sheet for Healthcare Providers: SeriousBroker.it  This test is not yet approved or cleared by the [place] FDA and has been authorized for detection and/or diagnosis of SARS-CoV-2 by FDA under an Emergency Use Authorization (EUA). This EUA will remain in effect (meaning this test can be used) for the duration of the COVID-19 declaration under Section 564(b)(1) of the Act, 21 U.S.C. section 360bbb-3(b)(1), unless the authorization is terminated or revoked.  Performed at [facility], [address] [place], [place] [address]      Time coordinating discharge: 35 minutes  SIGNED:   Erick Blinks, DO [facility] 02/04/2020, 9:17 AM  If 7PM-7AM, please contact night-coverage [email or web address]

## 2020-02-04 NOTE — Care Management Obs Status (Signed)
MEDICARE OBSERVATION STATUS NOTIFICATION   Patient Details  Name: Jacob Pace MRN: [id number] Date of Birth: 04-15-1950   Medicare Observation Status Notification Given:  Yes    Annice Needy, LCSW 02/04/2020, 11:14 AM

## 2020-02-04 NOTE — Clinical Social Work Note (Signed)
[facility] Emergency Notification complete. Notification ID is: [id number].

## 2020-02-04 NOTE — Care Management CC44 (Signed)
Condition Code 44 Documentation Completed  Patient Details  Name: VENANCIO CHENIER MRN: [id number] Date of Birth: 07/13/1950   Condition Code 44 given:  Yes Patient signature on Condition Code 44 notice:  Yes Documentation of 2 MD's agreement:  Yes Code 44 added to claim:  Yes    Annice Needy, LCSW 02/04/2020, 11:14 AM

## 2020-02-04 NOTE — Progress Notes (Signed)
Nsg Discharge Note  Admit Date:  02/03/2020 Discharge date: 02/04/2020   Molly Maduro L Browe to be D/C'd home per MD order.  AVS completed.  Copy for chart, and copy for patient signed, and dated. Patient/caregiver able to verbalize understanding.  Discharge Medication: Allergies as of 02/04/2020   No Known Allergies     Medication List    TAKE these medications   albuterol (2.5 MG/3ML) 0.083% nebulizer solution Commonly known as: PROVENTIL Take 6 mLs (5 mg total) by nebulization every 4 (four) hours as needed for wheezing or shortness of breath.   albuterol 108 (90 Base) MCG/ACT inhaler Commonly known as: VENTOLIN HFA Inhale 2 puffs into the lungs every 6 (six) hours as needed for wheezing or shortness of breath.   budesonide-formoterol 160-4.5 MCG/ACT inhaler Commonly known as: SYMBICORT Inhale 2 puffs into the lungs 2 (two) times daily.   diltiazem 180 MG 24 hr capsule Commonly known as: CARDIZEM CD Take 1 capsule (180 mg total) by mouth daily.   guaiFENesin 600 MG 12 hr tablet Commonly known as: MUCINEX Take 1 tablet (600 mg total) by mouth 2 (two) times daily for 7 days.   predniSONE 20 MG tablet Commonly known as: Deltasone Take 2 tablets (40 mg total) by mouth daily for 5 days.   tiotropium 18 MCG inhalation capsule Commonly known as: SPIRIVA Place 18 mcg into inhaler and inhale daily.       Discharge Assessment: Vitals:   02/04/20 0821 02/04/20 0900  BP:  122/77  Pulse:  (!) 101  Resp:  (!) 21  Temp:  97.8 F (36.6 C)  SpO2: 97% 98%   Skin clean, dry and intact without evidence of skin break down, no evidence of skin tears noted. IV catheter discontinued intact. Site without signs and symptoms of complications - no redness or edema noted at insertion site, patient denies c/o pain - only slight tenderness at site.  Dressing with slight pressure applied.  D/c Instructions-Education: Discharge instructions given to patient/family with verbalized  understanding. D/c education completed with patient/family including follow up instructions, medication list, d/c activities limitations if indicated, with other d/c instructions as indicated by MD - patient able to verbalize understanding, all questions fully answered. Patient instructed to return to ED, call 911, or call MD for any changes in condition.  Patient escorted via WC, and D/C home via private auto.  Carole Civil, RN 02/04/2020 2:30 PM

## 2020-02-04 NOTE — Care Management Obs Status (Signed)
MEDICARE OBSERVATION STATUS NOTIFICATION   Patient Details  Name: Jacob Pace MRN: [id number] Date of Birth: 06/16/1950   Medicare Observation Status Notification Given:  Yes    Harlie Ragle D, LCSW 02/04/2020, 11:14 AM 

## 2020-04-26 ENCOUNTER — Emergency Department (HOSPITAL_COMMUNITY): Payer: No Typology Code available for payment source

## 2020-04-26 ENCOUNTER — Inpatient Hospital Stay (HOSPITAL_COMMUNITY)
Admission: EM | Admit: 2020-04-26 | Discharge: 2020-04-29 | DRG: 191 | Disposition: A | Discharge status: Home / Self Care | Payer: No Typology Code available for payment source | Attending: Internal Medicine | Admitting: Internal Medicine

## 2020-04-26 ENCOUNTER — Other Ambulatory Visit: Payer: Self-pay

## 2020-04-26 ENCOUNTER — Encounter (HOSPITAL_COMMUNITY): Payer: Self-pay

## 2020-04-26 DIAGNOSIS — Z803 Family history of malignant neoplasm of breast: Secondary | ICD-10-CM

## 2020-04-26 DIAGNOSIS — J441 Chronic obstructive pulmonary disease with (acute) exacerbation: Secondary | ICD-10-CM | POA: Diagnosis not present

## 2020-04-26 DIAGNOSIS — J439 Emphysema, unspecified: Secondary | ICD-10-CM | POA: Diagnosis not present

## 2020-04-26 DIAGNOSIS — I471 Supraventricular tachycardia, unspecified: Secondary | ICD-10-CM | POA: Diagnosis present

## 2020-04-26 DIAGNOSIS — J440 Chronic obstructive pulmonary disease with acute lower respiratory infection: Secondary | ICD-10-CM | POA: Diagnosis present

## 2020-04-26 DIAGNOSIS — Z87891 Personal history of nicotine dependence: Secondary | ICD-10-CM

## 2020-04-26 DIAGNOSIS — Z20822 Contact with and (suspected) exposure to covid-19: Secondary | ICD-10-CM | POA: Diagnosis present

## 2020-04-26 DIAGNOSIS — M79606 Pain in leg, unspecified: Secondary | ICD-10-CM

## 2020-04-26 DIAGNOSIS — D72829 Elevated white blood cell count, unspecified: Secondary | ICD-10-CM

## 2020-04-26 DIAGNOSIS — J9621 Acute and chronic respiratory failure with hypoxia: Secondary | ICD-10-CM

## 2020-04-26 DIAGNOSIS — Z7951 Long term (current) use of inhaled steroids: Secondary | ICD-10-CM

## 2020-04-26 DIAGNOSIS — J9622 Acute and chronic respiratory failure with hypercapnia: Secondary | ICD-10-CM

## 2020-04-26 DIAGNOSIS — I10 Essential (primary) hypertension: Secondary | ICD-10-CM | POA: Diagnosis present

## 2020-04-26 DIAGNOSIS — Z9981 Dependence on supplemental oxygen: Secondary | ICD-10-CM

## 2020-04-26 DIAGNOSIS — R600 Localized edema: Secondary | ICD-10-CM | POA: Diagnosis present

## 2020-04-26 DIAGNOSIS — J9611 Chronic respiratory failure with hypoxia: Secondary | ICD-10-CM | POA: Diagnosis present

## 2020-04-26 DIAGNOSIS — Z79899 Other long term (current) drug therapy: Secondary | ICD-10-CM

## 2020-04-26 LAB — BASIC METABOLIC PANEL
Anion gap: 12 (ref 5–15)
BUN: 12 mg/dL (ref 8–23)
CO2: 30 mmol/L (ref 22–32)
Calcium: 9 mg/dL (ref 8.9–10.3)
Chloride: 95 mmol/L — ABNORMAL LOW (ref 98–111)
Creatinine, Ser: 0.99 mg/dL (ref 0.61–1.24)
GFR, Estimated: >60 mL/min (ref >60)
Glucose, Bld: 115 mg/dL — ABNORMAL HIGH (ref 70–99)
Potassium: 3.9 mmol/L (ref 3.5–5.1)
Sodium: 137 mmol/L (ref 135–145)

## 2020-04-26 LAB — CBC WITH DIFFERENTIAL/PLATELET
Abs Immature Granulocytes: 0.05 10*3/uL (ref 0.00–0.07)
Basophils Absolute: 0.1 10*3/uL (ref 0.0–0.1)
Basophils Relative: 1 %
Eosinophils Absolute: 0.5 10*3/uL (ref 0.0–0.5)
Eosinophils Relative: 4 %
HCT: 46 % (ref 39.0–52.0)
Hemoglobin: 14.1 g/dL (ref 13.0–17.0)
Immature Granulocytes: 0 %
Lymphocytes Relative: 29 %
Lymphs Abs: 3.5 10*3/uL (ref 0.7–4.0)
MCH: 28.7 pg (ref 26.0–34.0)
MCHC: 30.7 g/dL (ref 30.0–36.0)
MCV: 93.5 fL (ref 80.0–100.0)
Monocytes Absolute: 0.8 10*3/uL (ref 0.1–1.0)
Monocytes Relative: 7 %
Neutro Abs: 6.9 10*3/uL (ref 1.7–7.7)
Neutrophils Relative %: 59 %
Platelets: 301 10*3/uL (ref 150–400)
RBC: 4.92 MIL/uL (ref 4.22–5.81)
RDW: 15.1 % (ref 11.5–15.5)
WBC: 11.8 10*3/uL — ABNORMAL HIGH (ref 4.0–10.5)
nRBC: 0 % (ref 0.0–0.2)

## 2020-04-26 LAB — RESP PANEL BY RT-PCR (FLU A&B, COVID) ARPGX2
Influenza A by PCR: NEGATIVE
Influenza B by PCR: NEGATIVE
SARS Coronavirus 2 by RT PCR: NEGATIVE

## 2020-04-26 MED ORDER — IPRATROPIUM BROMIDE 0.02 % IN SOLN
0.5000 mg | Freq: Once | RESPIRATORY_TRACT | Status: AC
  Administered 2020-04-27: 0.5 mg via RESPIRATORY_TRACT
  Filled 2020-04-26: qty 2.5

## 2020-04-26 MED ORDER — ALBUTEROL (5 MG/ML) CONTINUOUS INHALATION SOLN
10.0000 mg/h | INHALATION_SOLUTION | Freq: Once | RESPIRATORY_TRACT | Status: AC
  Administered 2020-04-27: 10 mg/h via RESPIRATORY_TRACT
  Filled 2020-04-26: qty 20

## 2020-04-26 NOTE — ED Triage Notes (Signed)
BIB EMS c/o SOB starting tonight. Used home inhaler w/o relief. On NRB on arrival, usually 4L at home.

## 2020-04-26 NOTE — ED Provider Notes (Signed)
[facility] EMERGENCY DEPARTMENT Provider Note   CSN: [id number] Arrival date & time: 04/26/20  2252     History No chief complaint on file.   Jacob Pace is a 70 y.o. male.  Patient with history of COPD presents to the emergency department for evaluation of shortness of breath.  Breathing difficulties began yesterday, worsened tonight.  He has tried an inhaler without improvement.  He comes to the ER by ambulance from home.  EMS have administered Solu-Medrol and albuterol, oxygen with improvement.        Past Medical History:  Diagnosis Date  . COPD (chronic obstructive pulmonary disease) (HCC)   . Emphysema   . Hypertension     Patient Active Problem List   Diagnosis Date Noted  . Chronic respiratory failure with hypoxia, on home O2 therapy (HCC) 02/03/2020  . Essential hypertension 02/03/2020  . COPD with acute exacerbation (HCC) 02/03/2020  . Acute-on-chronic respiratory failure (HCC) 02/08/2013  . PSVT (paroxysmal supraventricular tachycardia) (HCC) 02/07/2013  . COPD (chronic obstructive pulmonary disease) (HCC) 02/05/2013  . BRONCHITIS, ACUTE 04/02/2010  . CHRONIC OBSTRUCTIVE PULMONARY DISEASE, ACUTE EXACERBATION 03/27/2009  . GASTRITIS 04/17/2008  . GASTROINTESTINAL XRAY, ABNORMAL 04/11/2008  . EMPHYSEMA 06/15/2007  . Nonspecific (abnormal) findings on radiological and other examination of body structure 06/15/2007  . ABNORMAL CHEST XRAY 06/15/2007    Past Surgical History:  Procedure Laterality Date  . HERNIA REPAIR    . right inguinal hernia repair  2007       Family History  Problem Relation Age of Onset  . Cancer Father        unsure what type  . Breast cancer Sister     Social History   Tobacco Use  . Smoking status: Former Smoker    Packs/day: 1.00    Years: 40.00    Pack years: 40.00    Quit date: 02/01/2008    Years since quitting: 12.2  . Smokeless tobacco: Never Used  Vaping Use  . Vaping Use: Never used  Substance Use Topics   . Alcohol use: Yes    Comment: weekends  . Drug use: No    Home Medications Prior to Admission medications   Medication Sig Start Date End Date Taking? Authorizing Provider  albuterol (PROVENTIL HFA;VENTOLIN HFA) 108 (90 BASE) MCG/ACT inhaler Inhale 2 puffs into the lungs every 6 (six) hours as needed for wheezing or shortness of breath.    [provider]  albuterol (PROVENTIL) (2.5 MG/3ML) 0.083% nebulizer solution Take 6 mLs (5 mg total) by nebulization every 4 (four) hours as needed for wheezing or shortness of breath. 02/08/13   Erick Blinks, MD  budesonide-formoterol (SYMBICORT) 160-4.5 MCG/ACT inhaler Inhale 2 puffs into the lungs 2 (two) times daily.    [provider]  diltiazem (CARDIZEM CD) 180 MG 24 hr capsule Take 1 capsule (180 mg total) by mouth daily. 02/08/13   Erick Blinks, MD  tiotropium (SPIRIVA) 18 MCG inhalation capsule Place 18 mcg into inhaler and inhale daily.    [provider]    Allergies    Patient has no known allergies.  Review of Systems   Review of Systems  Respiratory: Positive for shortness of breath.   All other systems reviewed and are negative.   Physical Exam Updated Vital Signs BP 138/83   Pulse 80   Temp 98.5 F (36.9 C) (Axillary)   Resp (!) 22   Ht 6\' 2"  (1.88 m)   Wt 96.2 kg   SpO2 97%  BMI 27.22 kg/m   Physical Exam Vitals and nursing note reviewed.  Constitutional:      General: He is not in acute distress.    Appearance: Normal appearance. He is well-developed.  HENT:     Head: Normocephalic and atraumatic.     Right Ear: Hearing normal.     Left Ear: Hearing normal.     Nose: Nose normal.  Eyes:     Conjunctiva/sclera: Conjunctivae normal.     Pupils: Pupils are equal, round, and reactive to light.  Cardiovascular:     Rate and Rhythm: Regular rhythm.     Heart sounds: S1 normal and S2 normal. No murmur heard. No friction rub. No gallop.   Pulmonary:     Effort: Tachypnea, accessory  muscle usage and prolonged expiration present.     Breath sounds: Decreased air movement present. Decreased breath sounds and wheezing present.  Chest:     Chest wall: No tenderness.  Abdominal:     General: Bowel sounds are normal.     Palpations: Abdomen is soft.     Tenderness: There is no abdominal tenderness. There is no guarding or rebound. Negative signs include Murphy's sign and McBurney's sign.     Hernia: No hernia is present.  Musculoskeletal:        General: Normal range of motion.     Cervical back: Normal range of motion and neck supple.  Skin:    General: Skin is warm and dry.     Findings: No rash.  Neurological:     Mental Status: He is alert and oriented to person, place, and time.     GCS: GCS eye subscore is 4. GCS verbal subscore is 5. GCS motor subscore is 6.     Cranial Nerves: No cranial nerve deficit.     Sensory: No sensory deficit.     Coordination: Coordination normal.  Psychiatric:        Speech: Speech normal.        Behavior: Behavior normal.        Thought Content: Thought content normal.     ED Results / Procedures / Treatments   Labs (all labs ordered are listed, but only abnormal results are displayed) Labs Reviewed  CBC WITH DIFFERENTIAL/PLATELET - Abnormal; Notable for the following components:      Result Value   WBC 11.8 (*)    All other components within normal limits  BASIC METABOLIC PANEL - Abnormal; Notable for the following components:   Chloride 95 (*)    Glucose, Bld 115 (*)    All other components within normal limits  RESP PANEL BY RT-PCR (FLU A&B, COVID) [id number]    EKG EKG Interpretation  Date/Time:  Sunday April 26 2020 23:03:11 EDT Ventricular Rate:  84 PR Interval:    QRS Duration: 98 QT Interval:  380 QTC Calculation: 442 R Axis:   67 Text Interpretation: Sinus or ectopic atrial rhythm Multiple ventricular premature complexes Short PR interval Minimal ST depression, inferior leads Confirmed by Gilda Crease ([id number]) on 04/26/2020 11:04:41 PM   Radiology DG Chest Port 1 View  Result Date: 04/26/2020 CLINICAL DATA:  Shortness of breath EXAM: PORTABLE CHEST 1 VIEW COMPARISON:  02/03/2020 FINDINGS: Cardiac shadow is within normal limits. Aortic calcifications are noted. The lungs are well aerated bilaterally with mild interstitial changes stable from the prior study. No acute abnormality is noted. IMPRESSION: Chronic interstitial changes without acute abnormality. Electronically Signed   By: Eulah Pont.D.  On: 04/26/2020 23:30    Procedures Procedures   Medications Ordered in ED Medications  albuterol (PROVENTIL,VENTOLIN) solution continuous neb (10 mg/hr Nebulization Given 04/27/20 0014)  ipratropium (ATROVENT) nebulizer solution 0.5 mg (0.5 mg Nebulization Given 04/27/20 0014)    ED Course  I have reviewed the triage vital signs and the nursing notes.  Pertinent labs & imaging results that were available during my care of the patient were reviewed by me and considered in my medical decision making (see chart for details).    MDM Rules/Calculators/A&P                          Patient presents to the emergency department for evaluation of shortness of breath.  Patient has a history of COPD, started having increased difficulty breathing yesterday despite using bronchodilator therapy at home.  Patient arrives to the ER tonight and in moderate distress.  Patient with significant evidence of bronchospasm on arrival.  Covid testing negative.  Chest x-ray with chronic changes, no evidence of pneumonia.  Patient has been treated with continuous albuterol therapy here in the department.  Has received Solu-Medrol by EMS.  Patient has improved but is still exhibiting significantly increased work of breathing, will require hospitalization for further management.  CRITICAL CARE Performed by: Gilda Crease   Total critical care time: 30 minutes  Critical care time was  exclusive of separately billable procedures and treating other patients.  Critical care was necessary to treat or prevent imminent or life-threatening deterioration.  Critical care was time spent personally by me on the following activities: development of treatment plan with patient and/or surrogate as well as nursing, discussions with consultants, evaluation of patient's response to treatment, examination of patient, obtaining history from patient or surrogate, ordering and performing treatments and interventions, ordering and review of laboratory studies, ordering and review of radiographic studies, pulse oximetry and re-evaluation of patient's condition.  Final Clinical Impression(s) / ED Diagnoses Final diagnoses:  Chronic obstructive pulmonary disease with acute exacerbation [facility])    Rx / DC Orders ED Discharge Orders    None       [place] Kestner, Canary Brim, MD 04/27/20 [phone]

## 2020-04-27 ENCOUNTER — Inpatient Hospital Stay (HOSPITAL_COMMUNITY): Payer: No Typology Code available for payment source

## 2020-04-27 ENCOUNTER — Encounter (HOSPITAL_COMMUNITY): Payer: Self-pay | Admitting: Internal Medicine

## 2020-04-27 DIAGNOSIS — J441 Chronic obstructive pulmonary disease with (acute) exacerbation: Secondary | ICD-10-CM

## 2020-04-27 DIAGNOSIS — I10 Essential (primary) hypertension: Secondary | ICD-10-CM

## 2020-04-27 DIAGNOSIS — Z79899 Other long term (current) drug therapy: Secondary | ICD-10-CM | POA: Diagnosis not present

## 2020-04-27 DIAGNOSIS — Z9981 Dependence on supplemental oxygen: Secondary | ICD-10-CM | POA: Diagnosis not present

## 2020-04-27 DIAGNOSIS — Z7951 Long term (current) use of inhaled steroids: Secondary | ICD-10-CM | POA: Diagnosis not present

## 2020-04-27 DIAGNOSIS — J9611 Chronic respiratory failure with hypoxia: Secondary | ICD-10-CM

## 2020-04-27 DIAGNOSIS — R600 Localized edema: Secondary | ICD-10-CM | POA: Diagnosis present

## 2020-04-27 DIAGNOSIS — D72829 Elevated white blood cell count, unspecified: Secondary | ICD-10-CM

## 2020-04-27 DIAGNOSIS — I471 Supraventricular tachycardia: Secondary | ICD-10-CM | POA: Diagnosis present

## 2020-04-27 DIAGNOSIS — Z803 Family history of malignant neoplasm of breast: Secondary | ICD-10-CM | POA: Diagnosis not present

## 2020-04-27 DIAGNOSIS — J9621 Acute and chronic respiratory failure with hypoxia: Secondary | ICD-10-CM

## 2020-04-27 DIAGNOSIS — J439 Emphysema, unspecified: Secondary | ICD-10-CM | POA: Diagnosis present

## 2020-04-27 DIAGNOSIS — Z87891 Personal history of nicotine dependence: Secondary | ICD-10-CM | POA: Diagnosis not present

## 2020-04-27 DIAGNOSIS — Z20822 Contact with and (suspected) exposure to covid-19: Secondary | ICD-10-CM | POA: Diagnosis present

## 2020-04-27 LAB — PROTIME-INR
INR: 1.1 (ref 0.8–1.2)
Prothrombin Time: 13.3 seconds (ref 11.4–15.2)

## 2020-04-27 LAB — APTT: aPTT: 29 seconds (ref 24–36)

## 2020-04-27 LAB — CBC
HCT: 43.1 % (ref 39.0–52.0)
Hemoglobin: 13.3 g/dL (ref 13.0–17.0)
MCH: 28.6 pg (ref 26.0–34.0)
MCHC: 30.9 g/dL (ref 30.0–36.0)
MCV: 92.7 fL (ref 80.0–100.0)
Platelets: 277 10*3/uL (ref 150–400)
RBC: 4.65 MIL/uL (ref 4.22–5.81)
RDW: 15.2 % (ref 11.5–15.5)
WBC: 14.4 10*3/uL — ABNORMAL HIGH (ref 4.0–10.5)
nRBC: 0 % (ref 0.0–0.2)

## 2020-04-27 LAB — COMPREHENSIVE METABOLIC PANEL
ALT: 13 U/L (ref 0–44)
AST: 25 U/L (ref 15–41)
Albumin: 3.7 g/dL (ref 3.5–5.0)
Alkaline Phosphatase: 60 U/L (ref 38–126)
Anion gap: 13 (ref 5–15)
BUN: 14 mg/dL (ref 8–23)
CO2: 28 mmol/L (ref 22–32)
Calcium: 9.2 mg/dL (ref 8.9–10.3)
Chloride: 96 mmol/L — ABNORMAL LOW (ref 98–111)
Creatinine, Ser: 0.97 mg/dL (ref 0.61–1.24)
GFR, Estimated: >60 mL/min (ref >60)
Glucose, Bld: 184 mg/dL — ABNORMAL HIGH (ref 70–99)
Potassium: 4.2 mmol/L (ref 3.5–5.1)
Sodium: 137 mmol/L (ref 135–145)
Total Bilirubin: 0.4 mg/dL (ref 0.3–1.2)
Total Protein: 7.2 g/dL (ref 6.5–8.1)

## 2020-04-27 LAB — PHOSPHORUS: Phosphorus: 2.8 mg/dL (ref 2.5–4.6)

## 2020-04-27 LAB — MAGNESIUM: Magnesium: 1.9 mg/dL (ref 1.7–2.4)

## 2020-04-27 MED ORDER — ALBUTEROL SULFATE HFA 108 (90 BASE) MCG/ACT IN AERS: 2.0000 | INHALATION_SPRAY | Freq: Four times a day (QID) | RESPIRATORY_TRACT | Status: DC | PRN

## 2020-04-27 MED ORDER — BUDESONIDE 0.5 MG/2ML IN SUSP
0.5000 mg | Freq: Two times a day (BID) | RESPIRATORY_TRACT | Status: DC
  Administered 2020-04-27 – 2020-04-29 (×4): 0.5 mg via RESPIRATORY_TRACT
  Filled 2020-04-27 (×4): qty 2

## 2020-04-27 MED ORDER — DILTIAZEM HCL ER COATED BEADS 180 MG PO CP24
180.0000 mg | ORAL_CAPSULE | Freq: Every day | ORAL | Status: DC
  Administered 2020-04-27 – 2020-04-29 (×3): 180 mg via ORAL
  Filled 2020-04-27 (×3): qty 1

## 2020-04-27 MED ORDER — TIOTROPIUM BROMIDE MONOHYDRATE 18 MCG IN CAPS: 18.0000 ug | ORAL_CAPSULE | Freq: Every day | RESPIRATORY_TRACT | Status: DC

## 2020-04-27 MED ORDER — METHYLPREDNISOLONE SODIUM SUCC 125 MG IJ SOLR
60.0000 mg | Freq: Two times a day (BID) | INTRAMUSCULAR | Status: AC
  Administered 2020-04-28 (×2): 60 mg via INTRAVENOUS
  Filled 2020-04-27 (×2): qty 2

## 2020-04-27 MED ORDER — ACETAMINOPHEN 325 MG PO TABS
650.0000 mg | ORAL_TABLET | Freq: Four times a day (QID) | ORAL | Status: DC | PRN
  Administered 2020-04-27: 650 mg via ORAL
  Filled 2020-04-27: qty 2

## 2020-04-27 MED ORDER — METHYLPREDNISOLONE SODIUM SUCC 40 MG IJ SOLR
40.0000 mg | Freq: Once | INTRAMUSCULAR | Status: AC
  Administered 2020-04-27: 40 mg via INTRAVENOUS
  Filled 2020-04-27: qty 1

## 2020-04-27 MED ORDER — ONDANSETRON HCL 4 MG/2ML IJ SOLN: 4.0000 mg | Freq: Four times a day (QID) | INTRAMUSCULAR | Status: DC | PRN

## 2020-04-27 MED ORDER — DM-GUAIFENESIN ER 30-600 MG PO TB12
1.0000 | ORAL_TABLET | Freq: Two times a day (BID) | ORAL | Status: DC
  Administered 2020-04-27 – 2020-04-29 (×6): 1 via ORAL
  Filled 2020-04-27 (×6): qty 1

## 2020-04-27 MED ORDER — MOMETASONE FURO-FORMOTEROL FUM 200-5 MCG/ACT IN AERO
2.0000 | INHALATION_SPRAY | Freq: Two times a day (BID) | RESPIRATORY_TRACT | Status: DC
  Administered 2020-04-27: 2 via RESPIRATORY_TRACT
  Filled 2020-04-27: qty 8.8

## 2020-04-27 MED ORDER — PANTOPRAZOLE SODIUM 40 MG PO TBEC
40.0000 mg | DELAYED_RELEASE_TABLET | Freq: Every day | ORAL | Status: DC
  Administered 2020-04-27 – 2020-04-29 (×3): 40 mg via ORAL
  Filled 2020-04-27 (×3): qty 1

## 2020-04-27 MED ORDER — METHYLPREDNISOLONE SODIUM SUCC 40 MG IJ SOLR
40.0000 mg | Freq: Two times a day (BID) | INTRAMUSCULAR | Status: DC
  Administered 2020-04-27 (×2): 40 mg via INTRAVENOUS
  Filled 2020-04-27 (×2): qty 1

## 2020-04-27 MED ORDER — IPRATROPIUM-ALBUTEROL 0.5-2.5 (3) MG/3ML IN SOLN
3.0000 mL | RESPIRATORY_TRACT | Status: DC | PRN
  Filled 2020-04-27: qty 3

## 2020-04-27 MED ORDER — ARFORMOTEROL TARTRATE 15 MCG/2ML IN NEBU
15.0000 ug | INHALATION_SOLUTION | Freq: Two times a day (BID) | RESPIRATORY_TRACT | Status: DC
  Administered 2020-04-27 – 2020-04-29 (×4): 15 ug via RESPIRATORY_TRACT
  Filled 2020-04-27 (×4): qty 2

## 2020-04-27 MED ORDER — AZITHROMYCIN 250 MG PO TABS
500.0000 mg | ORAL_TABLET | Freq: Every day | ORAL | Status: AC
  Administered 2020-04-27: 500 mg via ORAL
  Filled 2020-04-27: qty 2

## 2020-04-27 MED ORDER — AZITHROMYCIN 250 MG PO TABS
250.0000 mg | ORAL_TABLET | Freq: Every day | ORAL | Status: DC
  Administered 2020-04-28 – 2020-04-29 (×2): 250 mg via ORAL
  Filled 2020-04-27 (×2): qty 1

## 2020-04-27 MED ORDER — IPRATROPIUM-ALBUTEROL 0.5-2.5 (3) MG/3ML IN SOLN
3.0000 mL | Freq: Four times a day (QID) | RESPIRATORY_TRACT | Status: DC
  Administered 2020-04-27 – 2020-04-28 (×6): 3 mL via RESPIRATORY_TRACT
  Filled 2020-04-27 (×6): qty 3

## 2020-04-27 MED ORDER — UMECLIDINIUM BROMIDE 62.5 MCG/INH IN AEPB
1.0000 | INHALATION_SPRAY | Freq: Every day | RESPIRATORY_TRACT | Status: DC
  Administered 2020-04-27 – 2020-04-29 (×3): 1 via RESPIRATORY_TRACT
  Filled 2020-04-27: qty 7

## 2020-04-27 MED ORDER — ENOXAPARIN SODIUM 40 MG/0.4ML ~~LOC~~ SOLN
40.0000 mg | SUBCUTANEOUS | Status: DC
  Administered 2020-04-27 – 2020-04-28 (×2): 40 mg via SUBCUTANEOUS
  Filled 2020-04-27 (×3): qty 0.4

## 2020-04-27 NOTE — H&P (Signed)
History and Physical  Jacob Pace KNL:[id number] DOB: November 04, 1950 DOA: 04/26/2020  Referring physician: Gilda Crease, MD PCP: System, Provider Not In  Patient coming from: Home  Chief Complaint: Shortness of breath  HPI: Jacob Pace is a 70 y.o. male with medical history significant for COPD on supplemental oxygen at 4 LPM at home and hypertension who presents to the emergency department due to worsening shortness of breath.  Patient complained of several weeks of intermittent increased shortness of breath which was associated with cough and occasional production of yellow phlegm.  Shortness of breath worsened last night, he had no relief with home inhaler used.  EMS was activated and albuterol treatment, Solu-Medrol and oxygen supplementation were provided in route to the ED.  Patient denies fever, chills, chest pain, nausea, vomiting or abdominal pain.  ED Course: In the emergency department, he was initially mildly tachypneic, otherwise he was hemodynamically stable.  Supplemental oxygen was provided at 3 LPM with O2 sat ranging from 91% to 98%.  Work-up in the ED showed mild leukocytosis and normal BMP, SARS coronavirus 2 was negative. Chest x-ray showed chronic interstitial changes without acute abnormality. Further breathing treatment was provided in the ED, however, patient continued to wheeze.  Hospitalist was asked to admit patient for further management.  Review of Systems:  Constitutional: Negative for chills and fever.  HENT: Negative for ear pain and sore throat.   Eyes: Negative for pain and visual disturbance.  Respiratory: Positive for cough and shortness of breath.   Cardiovascular: Negative for chest pain and palpitations.  Gastrointestinal: Negative for abdominal pain and vomiting.  Endocrine: Negative for polyphagia and polyuria.  Genitourinary: Negative for decreased urine volume, dysuria, enuresis Musculoskeletal: Negative for arthralgias and back  pain.  Skin: Negative for color change and rash.  Allergic/Immunologic: Negative for immunocompromised state.  Neurological: Negative for tremors, syncope, speech difficulty, weakness, light-headedness and headaches.  Hematological: Does not bruise/bleed easily.  All other systems reviewed and are negative   Past Medical History:  Diagnosis Date  . COPD (chronic obstructive pulmonary disease) (HCC)   . Emphysema   . Hypertension    Past Surgical History:  Procedure Laterality Date  . HERNIA REPAIR    . right inguinal hernia repair  2007    Social History:  reports that he quit smoking about 12 years ago. He has a 40.00 pack-year smoking history. He has never used smokeless tobacco. He reports current alcohol use. He reports that he does not use drugs.   No Known Allergies  Family History  Problem Relation Age of Onset  . Cancer Father        unsure what type  . Breast cancer Sister      Prior to Admission medications   Medication Sig Start Date End Date Taking? Authorizing Provider  albuterol (PROVENTIL HFA;VENTOLIN HFA) 108 (90 BASE) MCG/ACT inhaler Inhale 2 puffs into the lungs every 6 (six) hours as needed for wheezing or shortness of breath.    [provider]  albuterol (PROVENTIL) (2.5 MG/3ML) 0.083% nebulizer solution Take 6 mLs (5 mg total) by nebulization every 4 (four) hours as needed for wheezing or shortness of breath. 02/08/13   Erick Blinks, MD  budesonide-formoterol (SYMBICORT) 160-4.5 MCG/ACT inhaler Inhale 2 puffs into the lungs 2 (two) times daily.    [provider]  diltiazem (CARDIZEM CD) 180 MG 24 hr capsule Take 1 capsule (180 mg total) by mouth daily. 02/08/13   Erick Blinks, MD  tiotropium [facility])  18 MCG inhalation capsule Place 18 mcg into inhaler and inhale daily.    [provider]    Physical Exam: BP (!) 153/86   Pulse 81   Temp 98.5 F (36.9 C) (Axillary)   Resp 20   Ht 6\' 2"  (1.88 m)   Wt 96.2 kg   SpO2  91%   BMI 27.22 kg/m   . General: 70 y.o. year-old male well developed well nourished in no acute distress.  Alert and oriented x3. 66 HEENT: NCAT, EOMI . Neck: Supple, trachea medial . Cardiovascular: Regular rate and rhythm with no rubs or gallops.  No thyromegaly or JVD noted.  2/4 pulses in all 4 extremities. Marland Kitchen Respiratory: Tachypnea.  Diffuse expiratory wheezing and accessory muscle use. . Abdomen: Soft nontender nondistended with normal bowel sounds x4 quadrants. . Muskuloskeletal: No cyanosis, clubbing or edema noted bilaterally . Neuro: CN II-XII intact, strength 5/5 x 4, sensation, reflexes intact . Skin: No ulcerative lesions noted or rashes . Psychiatry: Judgement and insight appear normal. Mood is appropriate for condition and setting          Labs on Admission:  Basic Metabolic Panel: Recent Labs  Lab 04/26/20 2303  NA 137  K 3.9  CL 95*  CO2 30  GLUCOSE 115*  BUN 12  CREATININE 0.99  CALCIUM 9.0   Liver Function Tests: No results for input(s): AST, ALT, ALKPHOS, BILITOT, PROT, ALBUMIN in the last 168 hours. No results for input(s): LIPASE, AMYLASE in the last 168 hours. No results for input(s): AMMONIA in the last 168 hours. CBC: Recent Labs  Lab 04/26/20 2303  WBC 11.8*  NEUTROABS 6.9  HGB 14.1  HCT 46.0  MCV 93.5  PLT 301   Cardiac Enzymes: No results for input(s): CKTOTAL, CKMB, CKMBINDEX, TROPONINI in the last 168 hours.  BNP (last 3 results) Recent Labs    02/03/20 1559  BNP 38.0    ProBNP (last 3 results) No results for input(s): PROBNP in the last 8760 hours.  CBG: No results for input(s): GLUCAP in the last 168 hours.  Radiological Exams on Admission: DG Chest Port 1 View  Result Date: 04/26/2020 CLINICAL DATA:  Shortness of breath EXAM: PORTABLE CHEST 1 VIEW COMPARISON:  02/03/2020 FINDINGS: Cardiac shadow is within normal limits. Aortic calcifications are noted. The lungs are well aerated bilaterally with mild interstitial  changes stable from the prior study. No acute abnormality is noted. IMPRESSION: Chronic interstitial changes without acute abnormality. Electronically Signed   By: 04/02/2020 M.D.   On: 04/26/2020 23:30    EKG: I independently viewed the EKG done and my findings are as followed: Sinus or ectopic atrial rhythm at a rate of 84 bpm with multifocal PVCs   Assessment/Plan Present on Admission: . COPD with acute exacerbation (HCC) . Essential hypertension  Principal Problem:   COPD with acute exacerbation (HCC) Active Problems:   Acute on chronic respiratory failure with hypoxia (HCC)   Essential hypertension   Leukocytosis   Acute on chronic respiratory failure with hypoxia secondary to COPD with acute exacerbation Continue duo nebs, Mucinex, Solu-Medrol, azithromycin. Continue Protonix to prevent steroid-induced ulcer Continue incentive spirometry and flutter valve Continue supplemental oxygen to maintain O2 sat > 92% with plan to wean patient to home oxygen as tolerated  Leukocytosis (chronically elevated) WBC 11.8, continue to monitor WBC with morning labs  Essential hypertension Continue home diltiazem   DVT prophylaxis: Lovenox  Code Status: Full code  Family Communication: None at bedside  Disposition  Plan:  Patient is from:                        home Anticipated DC to:                   home Anticipated DC date:               1 day Anticipated DC barriers:           Inpatient management required for treatment of COPD exacerbation   Consults called: None  Admission status: Observation   Frankey Shown MD [facility]  04/27/2020, 2:00 AM

## 2020-04-27 NOTE — TOC Progression Note (Signed)
Transition of Care [facility]) - Progression Note    Patient Details  Name: Jacob Pace MRN: [id number] Date of Birth: July 31, 1950  [facility]) CM/SW Contact  Leitha Bleak, RN Phone Number: 04/27/2020, 10:19 AM  Clinical Narrative:   Admitted in OBS for COPD, [facility] notification completed ID# [id number].    Expected Discharge Plan: Home/Self Care Barriers to Discharge: Continued Medical Work up  Expected Discharge Plan and Services Expected Discharge Plan: Home/Self Care

## 2020-04-27 NOTE — Progress Notes (Addendum)
PROGRESS NOTE  Jacob Pace CZY:[id number] DOB: 1950/03/09 DOA: 04/26/2020 PCP: System, Provider Not In  Brief History:  70 year old male with a history of COPD, SVT, chronic respiratory failure on 4 L, hypertension presenting with 1 week history of worsening cough and shortness of breath.  The patient states that he has been using his inhalers without much relief.  He had denied any fevers, chills, chest pain, nausea, vomiting, diarrhea, abdominal pain, hematochezia, melena.  He quit smoking 12 years ago. In the emergency department, the patient was afebrile and hemodynamically stable with oxygen saturation 95-96% on 4 L.  The patient was started on IV steroids and vasodilators.  Assessment/Plan: COPD exacerbation -Increase Solu-Medrol to 40 mg IV twice daily -Start Pulmicort -Start Brovana -Continue duo nebs -personally reviewed CXR--no consolidation; chronic interstitial markings  Chronic respiratory failure with hypoxia -He is chronically on 4 L nasal cannula at home  Essential hypertension -Continue diltiazem  SVT -Continue diltiazem -Currently in sinus  Leg edema -venous duplex      Status is: Inpatient  Remains inpatient appropriate because:IV treatments appropriate due to intensity of illness or inability to take PO   Dispo: The patient is from: Home              Anticipated d/c is to: Home              Patient currently is not medically stable to d/c.   Difficult to place patient No        Family Communication:  no Family at bedside  Consultants:  none  Code Status:  FULL /  DVT Prophylaxis:  [place] Lovenox   Procedures: As Listed in Progress Note Above  Antibiotics: None      Subjective: Patient denies fevers, chills, headache, chest pain, nausea, vomiting, diarrhea, abdominal pain, dysuria, hematuria, hematochezia, and melena.  He has sob with minimal exertions   Objective: Vitals:   04/27/20 0956 04/27/20 1000 04/27/20  1318 04/27/20 1321  BP: 117/77   114/80  Pulse: (!) 103 88  96  Resp: (!) 22 19  18   Temp: 98.2 F (36.8 C)     TempSrc: Oral     SpO2: 96%  95% 99%  Weight:      Height:        Intake/Output Summary (Last 24 hours) at 04/27/2020 1508 Last data filed at 04/27/2020 1300 Gross per 24 hour  Intake 480 ml  Output 675 ml  Net -195 ml   Weight change:  Exam:   General:  Pt is alert, follows commands appropriately, not in acute distress  HEENT: No icterus, No thrush, No neck mass, [place]/AT  Cardiovascular: RRR, S1/S2, no rubs, no gallops  Respiratory: diminished BS,  Bibasilar rales. No wheeze  Abdomen: Soft/+BS, non tender, non distended, no guarding  Extremities: No edema, No lymphangitis, No petechiae, No rashes, no synovitis   Data Reviewed: I have personally reviewed following labs and imaging studies Basic Metabolic Panel: Recent Labs  Lab 04/26/20 2303 04/27/20 0511  NA 137 137  K 3.9 4.2  CL 95* 96*  CO2 30 28  GLUCOSE 115* 184*  BUN 12 14  CREATININE 0.99 0.97  CALCIUM 9.0 9.2  MG  --  1.9  PHOS  --  2.8   Liver Function Tests: Recent Labs  Lab 04/27/20 0511  AST 25  ALT 13  ALKPHOS 60  BILITOT 0.4  PROT 7.2  ALBUMIN 3.7   No  results for input(s): LIPASE, AMYLASE in the last 168 hours. No results for input(s): AMMONIA in the last 168 hours. Coagulation Profile: Recent Labs  Lab 04/27/20 0511  INR 1.1   CBC: Recent Labs  Lab 04/26/20 2303 04/27/20 0511  WBC 11.8* 14.4*  NEUTROABS 6.9  --   HGB 14.1 13.3  HCT 46.0 43.1  MCV 93.5 92.7  PLT 301 277   Cardiac Enzymes: No results for input(s): CKTOTAL, CKMB, CKMBINDEX, TROPONINI in the last 168 hours. BNP: Invalid input(s): POCBNP CBG: No results for input(s): GLUCAP in the last 168 hours. HbA1C: No results for input(s): HGBA1C in the last 72 hours. Urine analysis: No results found for: COLORURINE, APPEARANCEUR, LABSPEC, PHURINE, GLUCOSEU, HGBUR, BILIRUBINUR, KETONESUR, PROTEINUR,  UROBILINOGEN, NITRITE, LEUKOCYTESUR Sepsis Labs: @LABRCNTIP (procalcitonin:4,lacticidven:4) ) Recent Results (from the past 240 hour(s))  Resp Panel by RT-PCR (Flu A&B, Covid) Nasopharyngeal Swab     Status: None   Collection Time: 04/26/20 11:04 PM   Specimen: Nasopharyngeal Swab; Nasopharyngeal(NP) swabs in vial transport medium  Result Value Ref Range Status   SARS Coronavirus 2 by RT PCR NEGATIVE NEGATIVE Final    Comment: (NOTE) SARS-CoV-2 target nucleic acids are NOT DETECTED.  The SARS-CoV-2 RNA is generally detectable in upper respiratory specimens during the acute phase of infection. The lowest concentration of SARS-CoV-2 viral copies this assay can detect is 138 copies/mL. A negative result does not preclude SARS-Cov-2 infection and should not be used as the sole basis for treatment or other patient management decisions. A negative result may occur with  improper specimen collection/handling, submission of specimen other than nasopharyngeal swab, presence of viral mutation(s) within the areas targeted by this assay, and inadequate number of viral copies(<138 copies/mL). A negative result must be combined with clinical observations, patient history, and epidemiological information. The expected result is Negative.  Fact Sheet for Patients:  04/28/20  Fact Sheet for Healthcare Providers:  BloggerCourse.com  This test is no t yet approved or cleared by the [place] FDA and  has been authorized for detection and/or diagnosis of SARS-CoV-2 by FDA under an Emergency Use Authorization (EUA). This EUA will remain  in effect (meaning this test can be used) for the duration of the COVID-19 declaration under Section 564(b)(1) of the Act, 21 U.S.C.section 360bbb-3(b)(1), unless the authorization is terminated  or revoked sooner.       Influenza A by PCR NEGATIVE NEGATIVE Final   Influenza B by PCR NEGATIVE NEGATIVE  Final    Comment: (NOTE) The Xpert Xpress SARS-CoV-2/FLU/RSV plus assay is intended as an aid in the diagnosis of influenza from Nasopharyngeal swab specimens and should not be used as a sole basis for treatment. Nasal washings and aspirates are unacceptable for Xpert Xpress SARS-CoV-2/FLU/RSV testing.  Fact Sheet for Patients: [place]  Fact Sheet for Healthcare Providers: BloggerCourse.com  This test is not yet approved or cleared by the [place] FDA and has been authorized for detection and/or diagnosis of SARS-CoV-2 by FDA under an Emergency Use Authorization (EUA). This EUA will remain in effect (meaning this test can be used) for the duration of the COVID-19 declaration under Section 564(b)(1) of the Act, 21 U.S.C. section 360bbb-3(b)(1), unless the authorization is terminated or revoked.  Performed at [facility], [address], [place], [place]      Scheduled Meds: . [START ON 04/28/2020] azithromycin  250 mg Oral Daily  . dextromethorphan-guaiFENesin  1 tablet Oral BID  . diltiazem  180 mg Oral Daily  . enoxaparin (LOVENOX)  injection  40 mg Subcutaneous Q24H  . ipratropium-albuterol  3 mL Nebulization Q6H  . methylPREDNISolone (SOLU-MEDROL) injection  40 mg Intravenous Q12H  . mometasone-formoterol  2 puff Inhalation BID  . pantoprazole  40 mg Oral Daily  . umeclidinium bromide  1 puff Inhalation Daily   Continuous Infusions:  Procedures/Studies: DG Chest Port 1 View  Result Date: 04/26/2020 CLINICAL DATA:  Shortness of breath EXAM: PORTABLE CHEST 1 VIEW COMPARISON:  02/03/2020 FINDINGS: Cardiac shadow is within normal limits. Aortic calcifications are noted. The lungs are well aerated bilaterally with mild interstitial changes stable from the prior study. No acute abnormality is noted. IMPRESSION: Chronic interstitial changes without acute abnormality. Electronically Signed   By: Alcide Clever  M.D.   On: 04/26/2020 23:30    Catarina Hartshorn, DO  [facility]  If 7PM-7AM, please contact night-coverage [email or web address] Password [email or web address] 04/27/2020, 3:08 PM   LOS: 0 days

## 2020-04-28 DIAGNOSIS — J441 Chronic obstructive pulmonary disease with (acute) exacerbation: Secondary | ICD-10-CM | POA: Diagnosis not present

## 2020-04-28 DIAGNOSIS — I10 Essential (primary) hypertension: Secondary | ICD-10-CM | POA: Diagnosis not present

## 2020-04-28 DIAGNOSIS — J9621 Acute and chronic respiratory failure with hypoxia: Secondary | ICD-10-CM | POA: Diagnosis not present

## 2020-04-28 LAB — BASIC METABOLIC PANEL
Anion gap: 11 (ref 5–15)
BUN: 11 mg/dL (ref 8–23)
CO2: 29 mmol/L (ref 22–32)
Calcium: 9.2 mg/dL (ref 8.9–10.3)
Chloride: 98 mmol/L (ref 98–111)
Creatinine, Ser: 0.69 mg/dL (ref 0.61–1.24)
GFR, Estimated: >60 mL/min (ref >60)
Glucose, Bld: 137 mg/dL — ABNORMAL HIGH (ref 70–99)
Potassium: 4.2 mmol/L (ref 3.5–5.1)
Sodium: 138 mmol/L (ref 135–145)

## 2020-04-28 LAB — BRAIN NATRIURETIC PEPTIDE: B Natriuretic Peptide: 100 pg/mL (ref 0.0–100.0)

## 2020-04-28 LAB — MAGNESIUM: Magnesium: 2.1 mg/dL (ref 1.7–2.4)

## 2020-04-28 MED ORDER — DILTIAZEM HCL ER COATED BEADS 180 MG PO CP24: 180.0000 mg | ORAL_CAPSULE | Freq: Every day | ORAL | 1 refills | Status: DC

## 2020-04-28 MED ORDER — IPRATROPIUM-ALBUTEROL 0.5-2.5 (3) MG/3ML IN SOLN
3.0000 mL | Freq: Three times a day (TID) | RESPIRATORY_TRACT | Status: DC
  Administered 2020-04-29: 3 mL via RESPIRATORY_TRACT
  Filled 2020-04-28: qty 3

## 2020-04-28 MED ORDER — PREDNISONE 20 MG PO TABS
60.0000 mg | ORAL_TABLET | Freq: Every day | ORAL | Status: DC
  Administered 2020-04-29: 60 mg via ORAL
  Filled 2020-04-28: qty 3

## 2020-04-28 MED ORDER — PREDNISONE 10 MG PO TABS: 60.0000 mg | ORAL_TABLET | Freq: Every day | ORAL | 0 refills | Status: DC

## 2020-04-28 MED ORDER — AZITHROMYCIN 250 MG PO TABS: 250.0000 mg | ORAL_TABLET | Freq: Every day | ORAL | 0 refills | Status: DC

## 2020-04-28 NOTE — Plan of Care (Signed)

## 2020-04-28 NOTE — Discharge Summary (Signed)
Physician Discharge Summary  Jacob Pace QQI:[id number] DOB: September 25, 1950 DOA: 04/26/2020  PCP: System, Provider Not In  Admit date: 04/26/2020 Discharge date: 04/29/2020  Admitted From: home Disposition:  Home  Recommendations for Outpatient Follow-up:  1. Follow up with PCP in 1-2 weeks 2. Please obtain BMP/CBC in one week   Equipment/Devices: 4L [place]  Discharge Condition: Stable CODE STATUS: FULL Diet recommendation: Heart Healthy   Brief/Interim Summary: 70 year old male with a history of COPD, SVT, chronic respiratory failure on 4 L, hypertension presenting with 1 week history of worsening cough and shortness of breath.  The patient states that he has been using his inhalers without much relief.  He had denied any fevers, chills, chest pain, nausea, vomiting, diarrhea, abdominal pain, hematochezia, melena.  He quit smoking 12 years ago. In the emergency department, the patient was afebrile and hemodynamically stable with oxygen saturation 95-96% on 4 L.  The patient was started on IV steroids and bronchodilators with gradual improvement.  Discharge Diagnoses:  COPD exacerbation -Increase Solu-Medrol to 60 mg IV twice daily -Started Pulmicort -Started Brovana -Continue duo nebs -personally reviewed CXR--no consolidation; chronic interstitial markings -d/c home with prednisone taper and 2 more days of azithromycin  Chronic respiratory failure with hypoxia -He is chronically on 4 L nasal cannula at home  Essential hypertension -Continue diltiazem CD 180 -well controlled  SVT -Continue diltiazem CD -Currently in sinus  Leg edema -venous duplex--neg   Discharge Instructions   Allergies as of 04/28/2020   No Known Allergies     Medication List    STOP taking these medications   diltiazem 360 MG 24 hr capsule Commonly known as: TIAZAC     TAKE these medications   albuterol (2.5 MG/3ML) 0.083% nebulizer solution Commonly known as: PROVENTIL Take 6 mLs  (5 mg total) by nebulization every 4 (four) hours as needed for wheezing or shortness of breath.   albuterol 108 (90 Base) MCG/ACT inhaler Commonly known as: VENTOLIN HFA Inhale 2 puffs into the lungs every 6 (six) hours as needed for wheezing or shortness of breath.   aspirin 81 MG chewable tablet Chew 1 tablet by mouth daily.   azithromycin 250 MG tablet Commonly known as: ZITHROMAX Take 1 tablet (250 mg total) by mouth daily. X 2 days Start taking on: April 30, 2020   budesonide-formoterol 160-4.5 MCG/ACT inhaler Commonly known as: SYMBICORT Inhale 2 puffs into the lungs 2 (two) times daily.   diltiazem 180 MG 24 hr capsule Commonly known as: CARDIZEM CD Take 1 capsule (180 mg total) by mouth daily.   predniSONE 10 MG tablet Commonly known as: DELTASONE Take 6 tablets (60 mg total) by mouth daily with breakfast. And decrease by one tablet daily Start taking on: April 29, 2020   tiotropium 18 MCG inhalation capsule Commonly known as: SPIRIVA Place 18 mcg into inhaler and inhale daily.       No Known Allergies  Consultations:  none   Procedures/Studies: US Venous Img Lower Bilateral (DVT)  Result Date: 04/27/2020 CLINICAL DATA:  Lower extremity edema. EXAM: BILATERAL LOWER EXTREMITY VENOUS DOPPLER ULTRASOUND TECHNIQUE: Gray-scale sonography with graded compression, as well as color Doppler and duplex ultrasound were performed to evaluate the lower extremity deep venous systems from the level of the common femoral vein and including the common femoral, femoral, profunda femoral, popliteal and calf veins including the posterior tibial, peroneal and gastrocnemius veins when visible. The superficial great saphenous vein was also interrogated. Spectral Doppler was utilized to evaluate flow at  rest and with distal augmentation maneuvers in the common femoral, femoral and popliteal veins. COMPARISON:  None. FINDINGS: RIGHT LOWER EXTREMITY Common Femoral Vein: No evidence of  thrombus. Normal compressibility, respiratory phasicity and response to augmentation. Saphenofemoral Junction: No evidence of thrombus. Normal compressibility and flow on color Doppler imaging. Profunda Femoral Vein: No evidence of thrombus. Normal compressibility and flow on color Doppler imaging. Femoral Vein: No evidence of thrombus. Normal compressibility, respiratory phasicity and response to augmentation. Popliteal Vein: No evidence of thrombus. Normal compressibility, respiratory phasicity and response to augmentation. Calf Veins: No evidence of thrombus. Normal compressibility and flow on color Doppler imaging. Superficial Great Saphenous Vein: No evidence of thrombus. Normal compressibility. Venous Reflux:  None. Other Findings: No evidence of superficial thrombophlebitis or abnormal fluid collection. LEFT LOWER EXTREMITY Common Femoral Vein: No evidence of thrombus. Normal compressibility, respiratory phasicity and response to augmentation. Saphenofemoral Junction: No evidence of thrombus. Normal compressibility and flow on color Doppler imaging. Profunda Femoral Vein: No evidence of thrombus. Normal compressibility and flow on color Doppler imaging. Femoral Vein: No evidence of thrombus. Normal compressibility, respiratory phasicity and response to augmentation. Popliteal Vein: No evidence of thrombus. Normal compressibility, respiratory phasicity and response to augmentation. Calf Veins: No evidence of thrombus. Normal compressibility and flow on color Doppler imaging. Superficial Great Saphenous Vein: No evidence of thrombus. Normal compressibility. Venous Reflux:  None. Other Findings: No evidence of superficial thrombophlebitis or abnormal fluid collection. IMPRESSION: No evidence of deep venous thrombosis in either lower extremity. Electronically Signed   By: Irish LackGlenn  Yamagata M.D.   On: 04/27/2020 16:38   DG Chest Port 1 View  Result Date: 04/26/2020 CLINICAL DATA:  Shortness of breath EXAM:  PORTABLE CHEST 1 VIEW COMPARISON:  02/03/2020 FINDINGS: Cardiac shadow is within normal limits. Aortic calcifications are noted. The lungs are well aerated bilaterally with mild interstitial changes stable from the prior study. No acute abnormality is noted. IMPRESSION: Chronic interstitial changes without acute abnormality. Electronically Signed   By: Alcide CleverMark  Lukens M.D.   On: 04/26/2020 23:30         Discharge Exam: Vitals:   04/28/20 0806 04/28/20 0819  BP:    Pulse:    Resp:    Temp:    SpO2: 93% 93%   Vitals:   04/28/20 0752 04/28/20 0757 04/28/20 0806 04/28/20 0819  BP:      Pulse:      Resp:      Temp:      TempSrc:      SpO2: 93% 93% 93% 93%  Weight:      Height:        General: Pt is alert, awake, not in acute distress Cardiovascular: RRR, S1/S2 +, no rubs, no gallops Respiratory: bibasilar rales. No wheeze.  Diminished BS Abdominal: Soft, NT, ND, bowel sounds + Extremities: no edema, no cyanosis   The results of significant diagnostics from this hospitalization (including imaging, microbiology, ancillary and laboratory) are listed below for reference.    Significant Diagnostic Studies: US Venous Img Lower Bilateral (DVT)  Result Date: 04/27/2020 CLINICAL DATA:  Lower extremity edema. EXAM: BILATERAL LOWER EXTREMITY VENOUS DOPPLER ULTRASOUND TECHNIQUE: Gray-scale sonography with graded compression, as well as color Doppler and duplex ultrasound were performed to evaluate the lower extremity deep venous systems from the level of the common femoral vein and including the common femoral, femoral, profunda femoral, popliteal and calf veins including the posterior tibial, peroneal and gastrocnemius veins when visible. The superficial great saphenous vein was also interrogated.  Spectral Doppler was utilized to evaluate flow at rest and with distal augmentation maneuvers in the common femoral, femoral and popliteal veins. COMPARISON:  None. FINDINGS: RIGHT LOWER EXTREMITY  Common Femoral Vein: No evidence of thrombus. Normal compressibility, respiratory phasicity and response to augmentation. Saphenofemoral Junction: No evidence of thrombus. Normal compressibility and flow on color Doppler imaging. Profunda Femoral Vein: No evidence of thrombus. Normal compressibility and flow on color Doppler imaging. Femoral Vein: No evidence of thrombus. Normal compressibility, respiratory phasicity and response to augmentation. Popliteal Vein: No evidence of thrombus. Normal compressibility, respiratory phasicity and response to augmentation. Calf Veins: No evidence of thrombus. Normal compressibility and flow on color Doppler imaging. Superficial Great Saphenous Vein: No evidence of thrombus. Normal compressibility. Venous Reflux:  None. Other Findings: No evidence of superficial thrombophlebitis or abnormal fluid collection. LEFT LOWER EXTREMITY Common Femoral Vein: No evidence of thrombus. Normal compressibility, respiratory phasicity and response to augmentation. Saphenofemoral Junction: No evidence of thrombus. Normal compressibility and flow on color Doppler imaging. Profunda Femoral Vein: No evidence of thrombus. Normal compressibility and flow on color Doppler imaging. Femoral Vein: No evidence of thrombus. Normal compressibility, respiratory phasicity and response to augmentation. Popliteal Vein: No evidence of thrombus. Normal compressibility, respiratory phasicity and response to augmentation. Calf Veins: No evidence of thrombus. Normal compressibility and flow on color Doppler imaging. Superficial Great Saphenous Vein: No evidence of thrombus. Normal compressibility. Venous Reflux:  None. Other Findings: No evidence of superficial thrombophlebitis or abnormal fluid collection. IMPRESSION: No evidence of deep venous thrombosis in either lower extremity. Electronically Signed   By: Irish Lack M.D.   On: 04/27/2020 16:38   DG Chest Port 1 View  Result Date: 04/26/2020 CLINICAL  DATA:  Shortness of breath EXAM: PORTABLE CHEST 1 VIEW COMPARISON:  02/03/2020 FINDINGS: Cardiac shadow is within normal limits. Aortic calcifications are noted. The lungs are well aerated bilaterally with mild interstitial changes stable from the prior study. No acute abnormality is noted. IMPRESSION: Chronic interstitial changes without acute abnormality. Electronically Signed   By: Alcide Clever M.D.   On: 04/26/2020 23:30     Microbiology: Recent Results (from the past 240 hour(s))  Resp Panel by RT-PCR (Flu A&B, Covid) Nasopharyngeal Swab     Status: None   Collection Time: 04/26/20 11:04 PM   Specimen: Nasopharyngeal Swab; Nasopharyngeal(NP) swabs in vial transport medium  Result Value Ref Range Status   SARS Coronavirus 2 by RT PCR NEGATIVE NEGATIVE Final    Comment: (NOTE) SARS-CoV-2 target nucleic acids are NOT DETECTED.  The SARS-CoV-2 RNA is generally detectable in upper respiratory specimens during the acute phase of infection. The lowest concentration of SARS-CoV-2 viral copies this assay can detect is 138 copies/mL. A negative result does not preclude SARS-Cov-2 infection and should not be used as the sole basis for treatment or other patient management decisions. A negative result may occur with  improper specimen collection/handling, submission of specimen other than nasopharyngeal swab, presence of viral mutation(s) within the areas targeted by this assay, and inadequate number of viral copies(<138 copies/mL). A negative result must be combined with clinical observations, patient history, and epidemiological information. The expected result is Negative.  Fact Sheet for Patients:  BloggerCourse.com  Fact Sheet for Healthcare Providers:  [employer]  This test is no t yet approved or cleared by the [place] FDA and  has been authorized for detection and/or diagnosis of SARS-CoV-2 by FDA under an Emergency Use  Authorization (EUA). This EUA will remain  in effect (meaning  this test can be used) for the duration of the COVID-19 declaration under Section 564(b)(1) of the Act, 21 U.S.C.section 360bbb-3(b)(1), unless the authorization is terminated  or revoked sooner.       Influenza A by PCR NEGATIVE NEGATIVE Final   Influenza B by PCR NEGATIVE NEGATIVE Final    Comment: (NOTE) The Xpert Xpress SARS-CoV-2/FLU/RSV plus assay is intended as an aid in the diagnosis of influenza from Nasopharyngeal swab specimens and should not be used as a sole basis for treatment. Nasal washings and aspirates are unacceptable for Xpert Xpress SARS-CoV-2/FLU/RSV testing.  Fact Sheet for Patients: BloggerCourse.com  Fact Sheet for Healthcare Providers: [employer]  This test is not yet approved or cleared by the [place] FDA and has been authorized for detection and/or diagnosis of SARS-CoV-2 by FDA under an Emergency Use Authorization (EUA). This EUA will remain in effect (meaning this test can be used) for the duration of the COVID-19 declaration under Section 564(b)(1) of the Act, 21 U.S.C. section 360bbb-3(b)(1), unless the authorization is terminated or revoked.  Performed at [facility], [address] [place], [place] [address]      Labs: Basic Metabolic Panel: Recent Labs  Lab 04/26/20 2303 04/27/20 0511 04/28/20 0416  NA 137 137 138  K 3.9 4.2 4.2  CL 95* 96* 98  CO2 30 28 29   GLUCOSE 115* 184* 137*  BUN 12 14 11   CREATININE 0.99 0.97 0.69  CALCIUM 9.0 9.2 9.2  MG  --  1.9 2.1  PHOS  --  2.8  --    Liver Function Tests: Recent Labs  Lab 04/27/20 0511  AST 25  ALT 13  ALKPHOS 60  BILITOT 0.4  PROT 7.2  ALBUMIN 3.7   No results for input(s): LIPASE, AMYLASE in the last 168 hours. No results for input(s): AMMONIA in the last 168 hours. CBC: Recent Labs  Lab 04/26/20 2303 04/27/20 0511  WBC 11.8* 14.4*   NEUTROABS 6.9  --   HGB 14.1 13.3  HCT 46.0 43.1  MCV 93.5 92.7  PLT 301 277   Cardiac Enzymes: No results for input(s): CKTOTAL, CKMB, CKMBINDEX, TROPONINI in the last 168 hours. BNP: Invalid input(s): POCBNP CBG: No results for input(s): GLUCAP in the last 168 hours.  Time coordinating discharge:  36 minutes  Signed:  2304, DO [facility] Pager: [phone] 04/28/2020, 12:37 PM

## 2020-04-29 NOTE — Progress Notes (Signed)
Patient seen and examined. Hemodynamically stable and in not acute distress. Speaking in full sentences and feeling ready to go home. Discharge summary and medication reconciliation has been reviewed and appropriate. Patient will follow up with PCP as instructed. Please refer to dictation prepared by Dr. Arbutus Leas on 04/28/20 for further info/details.   Vassie Loll MD [phone]

## 2020-06-12 ENCOUNTER — Other Ambulatory Visit: Payer: Self-pay

## 2020-06-12 ENCOUNTER — Ambulatory Visit (INDEPENDENT_AMBULATORY_CARE_PROVIDER_SITE_OTHER): Payer: No Typology Code available for payment source | Admitting: Emergency Medicine

## 2020-06-12 ENCOUNTER — Encounter: Payer: Self-pay | Admitting: Emergency Medicine

## 2020-06-12 DIAGNOSIS — Z9981 Dependence on supplemental oxygen: Secondary | ICD-10-CM

## 2020-06-12 DIAGNOSIS — J449 Chronic obstructive pulmonary disease, unspecified: Secondary | ICD-10-CM

## 2020-06-12 DIAGNOSIS — J9611 Chronic respiratory failure with hypoxia: Secondary | ICD-10-CM

## 2020-06-12 MED ORDER — BREZTRI AEROSPHERE 160-9-4.8 MCG/ACT IN AERO: 2.0000 | INHALATION_SPRAY | Freq: Two times a day (BID) | RESPIRATORY_TRACT | 0 refills | Status: DC

## 2020-06-12 NOTE — Patient Instructions (Signed)
Stop your Spiriva and Wixela for now. We will try starting Breztri 2 puffs twice a day.  Take this on a schedule with a spacer.  Rinse and gargle after using. You can use albuterol either 2 puffs or 1 nebulizer treatment up to every 4 hours if needed for shortness of breath, chest tightness, wheezing. Depending on how your breathing does with these changes we may need to consider other medications including possibly Daliresp, azithromycin, scheduled prednisone We will test your oxygen level with ambulation today on 4 L/min to ensure that this is high enough. Follow-up with Dr. Delton Coombes in 1 month or next available so that we can discuss how your breathing did with the medication changes.

## 2020-06-12 NOTE — Progress Notes (Signed)
Subjective:    Patient ID: Jacob Pace, male    DOB: [birth year], 70 y.o.   MRN: [id number]  HPI 70 year old former smoker (50 pack years) with a history of hypertension, COPD with emphysema.  He has been followed before the [place], had pulmonary function testing in 2012 that showed very severe airflow limitation and decreased diffusion capacity.  There is also some suspicion of secondary pulmonary hypertension due to this and also suspected obstructive sleep apnea.  He has had pulmonary nodular disease that is been followed with serial CT scan imaging.  Based on notes stable for 3 years on serial CT   He is referred today for further management of his COPD. He is using Spiriva, Symbicort. He uses albuterol prn, about 4-6x a day (HFA or nebulized). He has had AE twice in the last 10 yrs. Able to do household chores, uses O2 at 4L/min. ? Possible OSA > he doesn't snore, no witnessed apneas. Minimal sputum or cough. He does hear wheeze in the am. Sometimes awakened at night. Uses 4L/min at all times. PNA shots are UTD, gets the flu shot reliably.   ROV 06/12/20 --70 year old man, former smoker (50 pack years).  Have seen him in the past for emphysematous COPD with very severe obstruction and decreased diffusion capacity documented by PFT in 2012.  He has associated hypoxemic respiratory failure and uses oxygen at 4 L/min.  Also with a history of stable pulmonary nodular disease that was followed with serial CT scans.  It looks like he was admitted for acute exacerbations in January and then late March. On each occasion he had severe dyspnea, inability to walk across the room.  He has rare cough, prod of yellow. No PND.  We have been managing him on Spiriva and Symbicort - he is on wixella, changed by [place]. He feels this corresponds with when he started to have more trouble.    Review of Systems  Constitutional: Negative for fever and unexpected weight change.  HENT: Negative for congestion, dental  problem, ear pain, nosebleeds, postnasal drip, rhinorrhea, sinus pressure, sneezing, sore throat and trouble swallowing.   Eyes: Negative for redness and itching.  Respiratory: Positive for shortness of breath and wheezing. Negative for cough and chest tightness.   Cardiovascular: Negative for palpitations and leg swelling.  Gastrointestinal: Negative for nausea and vomiting.  Genitourinary: Negative for dysuria.  Musculoskeletal: Negative for joint swelling.  Skin: Negative for rash.  Neurological: Negative for headaches.  Hematological: Does not bruise/bleed easily.  Psychiatric/Behavioral: Negative for dysphoric mood. The patient is not nervous/anxious.     Past Medical History:  Diagnosis Date  . COPD (chronic obstructive pulmonary disease) (HCC)   . Emphysema   . Hypertension      Family History  Problem Relation Age of Onset  . Cancer Father        unsure what type  . Breast cancer Sister      Social History   Socioeconomic History  . Marital status: Legally Separated    Spouse name: Not on file  . Number of children: Not on file  . Years of education: Not on file  . Highest education level: Not on file  Occupational History  . Occupation: Education officer, environmental  Tobacco Use  . Smoking status: Former Smoker    Packs/day: 1.00    Years: 40.00    Pack years: 40.00    Quit date: 02/01/2008    Years since quitting: 12.3  . Smokeless tobacco: Never Used  Vaping Use  . Vaping Use: Never used  Substance and Sexual Activity  . Alcohol use: Yes    Comment: weekends  . Drug use: No  . Sexual activity: Never  Other Topics Concern  . Not on file  Social History Narrative   seperated with children         Social Determinants of Health   Financial Resource Strain: Not on file  Food Insecurity: Not on file  Transportation Needs: Not on file  Physical Activity: Not on file  Stress: Not on file  Social Connections: Not on file  Intimate Partner Violence: Not on file    He was in the [employer], was in [place], [place] until 1982, probable asbestos exposure. ? Agent Orange.  Has lived in [place], [place], [place].   No Known Allergies   Outpatient Medications Prior to Visit  Medication Sig Dispense Refill  . albuterol (PROVENTIL HFA;VENTOLIN HFA) 108 (90 BASE) MCG/ACT inhaler Inhale 2 puffs into the lungs every 6 (six) hours as needed for wheezing or shortness of breath.    Marland Kitchen albuterol (PROVENTIL) (2.5 MG/3ML) 0.083% nebulizer solution Take 6 mLs (5 mg total) by nebulization every 4 (four) hours as needed for wheezing or shortness of breath. 75 mL 12  . aspirin 81 MG chewable tablet Chew 1 tablet by mouth daily.    . budesonide-formoterol (SYMBICORT) 160-4.5 MCG/ACT inhaler Inhale 2 puffs into the lungs 2 (two) times daily.    Marland Kitchen diltiazem (CARDIZEM CD) 180 MG 24 hr capsule Take 1 capsule (180 mg total) by mouth daily. 30 capsule 1  . tiotropium (SPIRIVA) 18 MCG inhalation capsule Place 18 mcg into inhaler and inhale daily.    Marland Kitchen azithromycin (ZITHROMAX) 250 MG tablet Take 1 tablet (250 mg total) by mouth daily. X 2 days 2 each 0  . predniSONE (DELTASONE) 10 MG tablet Take 6 tablets (60 mg total) by mouth daily with breakfast. And decrease by one tablet daily 21 tablet 0   No facility-administered medications prior to visit.        Objective:   Physical Exam  Vitals:   06/12/20 1620  BP: 128/80  Pulse: 71  Temp: 98.4 F (36.9 C)  TempSrc: Temporal  SpO2: 100%  Weight: 217 lb (98.4 kg)  Height: 6\' 2"  (1.88 m)   Gen: Pleasant, elderly man, in no distress,  normal affect  ENT: No lesions,  mouth clear,  oropharynx clear, no postnasal drip  Neck: No JVD, no stridor  Lungs: No use of accessory muscles, distant, no crackles or wheezing on normal respiration, no wheeze on forced expiration  Cardiovascular: RRR, heart sounds normal, no murmur or gallops, no peripheral edema  Musculoskeletal: No deformities, no cyanosis or clubbing  Neuro: alert, awake, non  focal  Skin: Warm, no lesions or rash      Assessment & Plan:  COPD (chronic obstructive pulmonary disease) Clearly worse over the last 6 months.  Unclear whether this has been frequent exacerbations or this is just been an overall decline that has been interrupted by intermittent treatment with prednisone.  In retrospect he believes that it may relate to the change from Symbicort to [facility].  He may ultimately require initiation of prednisone, azithromycin, Daliresp if this is just progression of his overall disease.  Before I do that I would like to do a trial of changing him to a different bronchodilator regimen.  I will try Breztri to see if he gets benefit.  He has a spacer and he will  use this.  Depending on his response we will decide whether to alter BD regimen further, and any of the maintenance medications discussed above.  Chronic respiratory failure with hypoxia, on home O2 therapy (HCC) Walking oximetry today to ensure that his 4 L/min is adequate to prevent desaturations.  Levy Pupa, MD, PhD 06/12/2020, 5:49 PM [facility] [phone] or if no answer [phone]

## 2020-06-12 NOTE — Assessment & Plan Note (Signed)
Clearly worse over the last 6 months.  Unclear whether this has been frequent exacerbations or this is just been an overall decline that has been interrupted by intermittent treatment with prednisone.  In retrospect he believes that it may relate to the change from Symbicort to [facility].  He may ultimately require initiation of prednisone, azithromycin, Daliresp if this is just progression of his overall disease.  Before I do that I would like to do a trial of changing him to a different bronchodilator regimen.  I will try Breztri to see if he gets benefit.  He has a spacer and he will use this.  Depending on his response we will decide whether to alter BD regimen further, and any of the maintenance medications discussed above.

## 2020-06-12 NOTE — Assessment & Plan Note (Signed)
Walking oximetry today to ensure that his 4 L/min is adequate to prevent desaturations.

## 2020-09-24 ENCOUNTER — Other Ambulatory Visit: Payer: Self-pay

## 2020-09-24 ENCOUNTER — Observation Stay (HOSPITAL_COMMUNITY)
Admission: EM | Admit: 2020-09-24 | Discharge: 2020-09-25 | Disposition: A | Discharge status: Home / Self Care | Payer: No Typology Code available for payment source | Attending: Family Medicine | Admitting: Family Medicine

## 2020-09-24 ENCOUNTER — Encounter (HOSPITAL_COMMUNITY): Payer: Self-pay | Admitting: Family Medicine

## 2020-09-24 ENCOUNTER — Emergency Department (HOSPITAL_COMMUNITY): Payer: No Typology Code available for payment source

## 2020-09-24 DIAGNOSIS — Z20822 Contact with and (suspected) exposure to covid-19: Secondary | ICD-10-CM | POA: Diagnosis not present

## 2020-09-24 DIAGNOSIS — I471 Supraventricular tachycardia, unspecified: Secondary | ICD-10-CM | POA: Diagnosis present

## 2020-09-24 DIAGNOSIS — J441 Chronic obstructive pulmonary disease with (acute) exacerbation: Principal | ICD-10-CM | POA: Insufficient documentation

## 2020-09-24 DIAGNOSIS — J9621 Acute and chronic respiratory failure with hypoxia: Secondary | ICD-10-CM | POA: Diagnosis present

## 2020-09-24 DIAGNOSIS — I10 Essential (primary) hypertension: Secondary | ICD-10-CM | POA: Diagnosis present

## 2020-09-24 DIAGNOSIS — R0602 Shortness of breath: Secondary | ICD-10-CM | POA: Diagnosis present

## 2020-09-24 DIAGNOSIS — Z87891 Personal history of nicotine dependence: Secondary | ICD-10-CM | POA: Insufficient documentation

## 2020-09-24 DIAGNOSIS — Z79899 Other long term (current) drug therapy: Secondary | ICD-10-CM | POA: Diagnosis not present

## 2020-09-24 DIAGNOSIS — J440 Chronic obstructive pulmonary disease with acute lower respiratory infection: Secondary | ICD-10-CM | POA: Diagnosis present

## 2020-09-24 DIAGNOSIS — J9622 Acute and chronic respiratory failure with hypercapnia: Secondary | ICD-10-CM | POA: Diagnosis present

## 2020-09-24 DIAGNOSIS — Z7982 Long term (current) use of aspirin: Secondary | ICD-10-CM | POA: Diagnosis not present

## 2020-09-24 LAB — CBC WITH DIFFERENTIAL/PLATELET
Abs Immature Granulocytes: 0.05 10*3/uL (ref 0.00–0.07)
Basophils Absolute: 0.1 10*3/uL (ref 0.0–0.1)
Basophils Relative: 0 %
Eosinophils Absolute: 0.2 10*3/uL (ref 0.0–0.5)
Eosinophils Relative: 2 %
HCT: 46 % (ref 39.0–52.0)
Hemoglobin: 14.3 g/dL (ref 13.0–17.0)
Immature Granulocytes: 0 %
Lymphocytes Relative: 16 %
Lymphs Abs: 1.8 10*3/uL (ref 0.7–4.0)
MCH: 29.7 pg (ref 26.0–34.0)
MCHC: 31.1 g/dL (ref 30.0–36.0)
MCV: 95.6 fL (ref 80.0–100.0)
Monocytes Absolute: 0.7 10*3/uL (ref 0.1–1.0)
Monocytes Relative: 6 %
Neutro Abs: 8.6 10*3/uL — ABNORMAL HIGH (ref 1.7–7.7)
Neutrophils Relative %: 76 %
Platelets: 225 10*3/uL (ref 150–400)
RBC: 4.81 MIL/uL (ref 4.22–5.81)
RDW: 15.2 % (ref 11.5–15.5)
WBC: 11.4 10*3/uL — ABNORMAL HIGH (ref 4.0–10.5)
nRBC: 0 % (ref 0.0–0.2)

## 2020-09-24 LAB — COMPREHENSIVE METABOLIC PANEL
ALT: 28 U/L (ref 0–44)
AST: 37 U/L (ref 15–41)
Albumin: 3.8 g/dL (ref 3.5–5.0)
Alkaline Phosphatase: 61 U/L (ref 38–126)
Anion gap: 11 (ref 5–15)
BUN: 7 mg/dL — ABNORMAL LOW (ref 8–23)
CO2: 33 mmol/L — ABNORMAL HIGH (ref 22–32)
Calcium: 9 mg/dL (ref 8.9–10.3)
Chloride: 95 mmol/L — ABNORMAL LOW (ref 98–111)
Creatinine, Ser: 0.64 mg/dL (ref 0.61–1.24)
GFR, Estimated: >60 mL/min (ref >60)
Glucose, Bld: 108 mg/dL — ABNORMAL HIGH (ref 70–99)
Potassium: 4 mmol/L (ref 3.5–5.1)
Sodium: 139 mmol/L (ref 135–145)
Total Bilirubin: 0.6 mg/dL (ref 0.3–1.2)
Total Protein: 7.5 g/dL (ref 6.5–8.1)

## 2020-09-24 LAB — BLOOD GAS, VENOUS
Acid-Base Excess: 7.3 mmol/L — ABNORMAL HIGH (ref 0.0–2.0)
Bicarbonate: 27.7 mmol/L (ref 20.0–28.0)
FIO2: 97
O2 Saturation: 56.2 %
Patient temperature: 36.6
pCO2, Ven: 68.2 mmHg — ABNORMAL HIGH (ref 44.0–60.0)
pH, Ven: 7.309 (ref 7.250–7.430)
pO2, Ven: 35.3 mmHg (ref 32.0–45.0)

## 2020-09-24 LAB — RESP PANEL BY RT-PCR (FLU A&B, COVID) ARPGX2
Influenza A by PCR: NEGATIVE
Influenza B by PCR: NEGATIVE
SARS Coronavirus 2 by RT PCR: NEGATIVE

## 2020-09-24 MED ORDER — METHYLPREDNISOLONE SODIUM SUCC 40 MG IJ SOLR
40.0000 mg | Freq: Two times a day (BID) | INTRAMUSCULAR | Status: DC
  Administered 2020-09-24 – 2020-09-25 (×2): 40 mg via INTRAVENOUS
  Filled 2020-09-24 (×2): qty 1

## 2020-09-24 MED ORDER — METHYLPREDNISOLONE SODIUM SUCC 40 MG IJ SOLR: 40.0000 mg | Freq: Two times a day (BID) | INTRAMUSCULAR | Status: DC

## 2020-09-24 MED ORDER — UMECLIDINIUM BROMIDE 62.5 MCG/INH IN AEPB
1.0000 | INHALATION_SPRAY | Freq: Every day | RESPIRATORY_TRACT | Status: DC
  Administered 2020-09-25: 1 via RESPIRATORY_TRACT
  Filled 2020-09-24: qty 7

## 2020-09-24 MED ORDER — ONDANSETRON HCL 4 MG/2ML IJ SOLN: 4.0000 mg | Freq: Four times a day (QID) | INTRAMUSCULAR | Status: DC | PRN

## 2020-09-24 MED ORDER — POLYETHYLENE GLYCOL 3350 17 G PO PACK: 17.0000 g | PACK | Freq: Every day | ORAL | Status: DC | PRN

## 2020-09-24 MED ORDER — TIOTROPIUM BROMIDE MONOHYDRATE 18 MCG IN CAPS: 18.0000 ug | ORAL_CAPSULE | Freq: Every day | RESPIRATORY_TRACT | Status: DC

## 2020-09-24 MED ORDER — THIAMINE HCL 100 MG PO TABS
100.0000 mg | ORAL_TABLET | Freq: Every day | ORAL | Status: DC
  Administered 2020-09-24: 100 mg via ORAL
  Filled 2020-09-24: qty 1

## 2020-09-24 MED ORDER — DILTIAZEM HCL ER COATED BEADS 180 MG PO CP24
180.0000 mg | ORAL_CAPSULE | Freq: Every day | ORAL | Status: DC
  Administered 2020-09-25: 180 mg via ORAL
  Filled 2020-09-24: qty 1

## 2020-09-24 MED ORDER — BISACODYL 10 MG RE SUPP: 10.0000 mg | Freq: Every day | RECTAL | Status: DC | PRN

## 2020-09-24 MED ORDER — IPRATROPIUM-ALBUTEROL 0.5-2.5 (3) MG/3ML IN SOLN
3.0000 mL | Freq: Once | RESPIRATORY_TRACT | Status: AC
  Administered 2020-09-24: 3 mL via RESPIRATORY_TRACT
  Filled 2020-09-24: qty 3

## 2020-09-24 MED ORDER — ACETAMINOPHEN 650 MG RE SUPP: 650.0000 mg | Freq: Four times a day (QID) | RECTAL | Status: DC | PRN

## 2020-09-24 MED ORDER — ONDANSETRON HCL 4 MG PO TABS: 4.0000 mg | ORAL_TABLET | Freq: Four times a day (QID) | ORAL | Status: DC | PRN

## 2020-09-24 MED ORDER — ALBUTEROL (5 MG/ML) CONTINUOUS INHALATION SOLN
INHALATION_SOLUTION | RESPIRATORY_TRACT | Status: AC
  Administered 2020-09-24: 10 mg/h via RESPIRATORY_TRACT
  Filled 2020-09-24: qty 20

## 2020-09-24 MED ORDER — FOLIC ACID 1 MG PO TABS
1.0000 mg | ORAL_TABLET | Freq: Every day | ORAL | Status: DC
  Administered 2020-09-24 – 2020-09-25 (×2): 1 mg via ORAL
  Filled 2020-09-24 (×2): qty 1

## 2020-09-24 MED ORDER — THIAMINE HCL 100 MG/ML IJ SOLN
100.0000 mg | Freq: Every day | INTRAMUSCULAR | Status: DC
  Administered 2020-09-25: 100 mg via INTRAVENOUS
  Filled 2020-09-24: qty 2

## 2020-09-24 MED ORDER — ENOXAPARIN SODIUM 40 MG/0.4ML IJ SOSY
40.0000 mg | PREFILLED_SYRINGE | INTRAMUSCULAR | Status: DC
  Administered 2020-09-24: 40 mg via SUBCUTANEOUS
  Filled 2020-09-24: qty 0.4

## 2020-09-24 MED ORDER — ACETAMINOPHEN 325 MG PO TABS: 650.0000 mg | ORAL_TABLET | Freq: Four times a day (QID) | ORAL | Status: DC | PRN

## 2020-09-24 MED ORDER — METHYLPREDNISOLONE SODIUM SUCC 125 MG IJ SOLR: 125.0000 mg | Freq: Once | INTRAMUSCULAR | Status: AC

## 2020-09-24 MED ORDER — ALBUTEROL SULFATE (2.5 MG/3ML) 0.083% IN NEBU: 2.5000 mg | INHALATION_SOLUTION | RESPIRATORY_TRACT | Status: DC | PRN

## 2020-09-24 MED ORDER — ALBUTEROL SULFATE (2.5 MG/3ML) 0.083% IN NEBU
2.5000 mg | INHALATION_SOLUTION | RESPIRATORY_TRACT | Status: DC | PRN
  Administered 2020-09-24 – 2020-09-25 (×2): 2.5 mg via RESPIRATORY_TRACT
  Filled 2020-09-24 (×2): qty 3

## 2020-09-24 MED ORDER — DOXYCYCLINE HYCLATE 100 MG PO TABS
100.0000 mg | ORAL_TABLET | Freq: Two times a day (BID) | ORAL | Status: DC
  Administered 2020-09-24 – 2020-09-25 (×3): 100 mg via ORAL
  Filled 2020-09-24 (×3): qty 1

## 2020-09-24 MED ORDER — TRAZODONE HCL 50 MG PO TABS: 50.0000 mg | ORAL_TABLET | Freq: Every evening | ORAL | Status: DC | PRN

## 2020-09-24 MED ORDER — ALBUTEROL SULFATE (2.5 MG/3ML) 0.083% IN NEBU
3.0000 mL | INHALATION_SOLUTION | Freq: Three times a day (TID) | RESPIRATORY_TRACT | Status: DC
  Filled 2020-09-24: qty 3

## 2020-09-24 MED ORDER — GUAIFENESIN ER 600 MG PO TB12
600.0000 mg | ORAL_TABLET | Freq: Two times a day (BID) | ORAL | Status: DC
  Administered 2020-09-24 – 2020-09-25 (×3): 600 mg via ORAL
  Filled 2020-09-24 (×3): qty 1

## 2020-09-24 MED ORDER — ALBUTEROL SULFATE (2.5 MG/3ML) 0.083% IN NEBU
3.0000 mL | INHALATION_SOLUTION | Freq: Four times a day (QID) | RESPIRATORY_TRACT | Status: DC | PRN
  Administered 2020-09-24: 3 mL via RESPIRATORY_TRACT
  Filled 2020-09-24: qty 3

## 2020-09-24 MED ORDER — ALBUTEROL (5 MG/ML) CONTINUOUS INHALATION SOLN
10.0000 mg/h | INHALATION_SOLUTION | RESPIRATORY_TRACT | Status: DC
  Administered 2020-09-24: 10 mg/h via RESPIRATORY_TRACT

## 2020-09-24 MED ORDER — SODIUM CHLORIDE 0.9% FLUSH
3.0000 mL | Freq: Two times a day (BID) | INTRAVENOUS | Status: DC
  Administered 2020-09-24 – 2020-09-25 (×2): 3 mL via INTRAVENOUS

## 2020-09-24 MED ORDER — LORAZEPAM 2 MG/ML IJ SOLN: 1.0000 mg | INTRAMUSCULAR | Status: DC | PRN

## 2020-09-24 MED ORDER — ADULT MULTIVITAMIN W/MINERALS CH
1.0000 | ORAL_TABLET | Freq: Every day | ORAL | Status: DC
  Administered 2020-09-25: 1 via ORAL
  Filled 2020-09-24 (×2): qty 1

## 2020-09-24 MED ORDER — MOMETASONE FURO-FORMOTEROL FUM 200-5 MCG/ACT IN AERO
2.0000 | INHALATION_SPRAY | Freq: Two times a day (BID) | RESPIRATORY_TRACT | Status: DC
  Administered 2020-09-24 – 2020-09-25 (×2): 2 via RESPIRATORY_TRACT
  Filled 2020-09-24: qty 8.8

## 2020-09-24 MED ORDER — MAGNESIUM SULFATE 2 GM/50ML IV SOLN
2.0000 g | Freq: Once | INTRAVENOUS | Status: AC
  Administered 2020-09-24: 2 g via INTRAVENOUS
  Filled 2020-09-24: qty 50

## 2020-09-24 MED ORDER — LORAZEPAM 2 MG/ML IJ SOLN
0.5000 mg | Freq: Once | INTRAMUSCULAR | Status: AC
  Administered 2020-09-24: 0.5 mg via INTRAVENOUS
  Filled 2020-09-24: qty 1

## 2020-09-24 MED ORDER — SODIUM CHLORIDE 0.9% FLUSH
3.0000 mL | Freq: Two times a day (BID) | INTRAVENOUS | Status: DC
  Administered 2020-09-25: 3 mL via INTRAVENOUS

## 2020-09-24 MED ORDER — ALBUTEROL SULFATE (2.5 MG/3ML) 0.083% IN NEBU
2.5000 mg | INHALATION_SOLUTION | Freq: Once | RESPIRATORY_TRACT | Status: AC
  Administered 2020-09-24: 2.5 mg via RESPIRATORY_TRACT
  Filled 2020-09-24: qty 3

## 2020-09-24 MED ORDER — LORAZEPAM 1 MG PO TABS
1.0000 mg | ORAL_TABLET | ORAL | Status: DC | PRN
  Administered 2020-09-25: 1 mg via ORAL
  Filled 2020-09-24: qty 1

## 2020-09-24 MED ORDER — SODIUM CHLORIDE 0.9 % IV SOLN: 250.0000 mL | INTRAVENOUS | Status: DC | PRN

## 2020-09-24 MED ORDER — IPRATROPIUM-ALBUTEROL 0.5-2.5 (3) MG/3ML IN SOLN: 3.0000 mL | Freq: Four times a day (QID) | RESPIRATORY_TRACT | Status: DC

## 2020-09-24 MED ORDER — SODIUM CHLORIDE 0.9% FLUSH: 3.0000 mL | INTRAVENOUS | Status: DC | PRN

## 2020-09-24 NOTE — Progress Notes (Signed)
**Note De-Identified  Obfuscation** Patient removed from BIPAP and placed on 4 L [place] per patient request. Patient states that he is not feeling any better and is anxious. VS WNL BBS wheeze. RRT to continue to monitor.

## 2020-09-24 NOTE — ED Provider Notes (Signed)
[facility] EMERGENCY DEPARTMENT Provider Note   CSN: [id number] Arrival date & time: 09/24/20  0846     History Chief Complaint  Patient presents with   Respiratory Distress    Jacob Pace is a 70 y.o. male.  Patient complains of shortness of breath.  Patient has history of COPD and uses 4 L of nasal cannula normally.  Patient has had a mild cough  The history is provided by the patient and medical records. No language interpreter was used.  Shortness of Breath Severity:  Moderate Onset quality:  Sudden Duration:  2 days Timing:  Constant Progression:  Worsening Chronicity:  Recurrent Context: activity   Relieved by:  Nothing Worsened by:  Coughing Ineffective treatments:  None tried Associated symptoms: wheezing   Associated symptoms: no abdominal pain, no chest pain, no cough, no headaches and no rash       Past Medical History:  Diagnosis Date   COPD (chronic obstructive pulmonary disease) (HCC)    Emphysema    Hypertension     Patient Active Problem List   Diagnosis Date Noted   Leukocytosis 04/27/2020   COPD exacerbation (HCC) 04/27/2020   Chronic respiratory failure with hypoxia, on home O2 therapy (HCC) 02/03/2020   Essential hypertension 02/03/2020   COPD with acute exacerbation (HCC) 02/03/2020   Acute on chronic respiratory failure with hypoxia (HCC) 02/08/2013   PSVT (paroxysmal supraventricular tachycardia) (HCC) 02/07/2013   COPD (chronic obstructive pulmonary disease) (HCC) 02/05/2013   BRONCHITIS, ACUTE 04/02/2010   GASTRITIS 04/17/2008   GASTROINTESTINAL XRAY, ABNORMAL 04/11/2008   EMPHYSEMA 06/15/2007   Nonspecific (abnormal) findings on radiological and other examination of body structure 06/15/2007   ABNORMAL CHEST XRAY 06/15/2007    Past Surgical History:  Procedure Laterality Date   HERNIA REPAIR     right inguinal hernia repair  2007       Family History  Problem Relation Age of Onset   Cancer Father        unsure what  type   Breast cancer Sister     Social History   Tobacco Use   Smoking status: Former    Packs/day: 1.00    Years: 40.00    Pack years: 40.00    Types: Cigarettes    Quit date: 02/01/2008    Years since quitting: 12.6   Smokeless tobacco: Never  Vaping Use   Vaping Use: Never used  Substance Use Topics   Alcohol use: Yes    Comment: weekends   Drug use: No    Home Medications Prior to Admission medications   Medication Sig Start Date End Date Taking? Authorizing Provider  albuterol (PROVENTIL HFA;VENTOLIN HFA) 108 (90 BASE) MCG/ACT inhaler Inhale 2 puffs into the lungs every 6 (six) hours as needed for wheezing or shortness of breath.    [provider]  albuterol (PROVENTIL) (2.5 MG/3ML) 0.083% nebulizer solution Take 6 mLs (5 mg total) by nebulization every 4 (four) hours as needed for wheezing or shortness of breath. 02/08/13   Erick Blinks, MD  aspirin 81 MG chewable tablet Chew 1 tablet by mouth daily. 09/30/19   [provider]  azithromycin (ZITHROMAX) 250 MG tablet Take 1 tablet (250 mg total) by mouth daily. X 2 days 04/30/20   Catarina Hartshorn, MD  Budeson-Glycopyrrol-Formoterol (BREZTRI AEROSPHERE) 160-9-4.8 MCG/ACT AERO Inhale 2 puffs into the lungs in the morning and at bedtime. 06/12/20   Leslye Peer, MD  budesonide-formoterol (SYMBICORT) 160-4.5 MCG/ACT inhaler Inhale 2 puffs into the lungs  2 (two) times daily.    [provider]  diltiazem (CARDIZEM CD) 180 MG 24 hr capsule Take 1 capsule (180 mg total) by mouth daily. 04/28/20   Catarina Hartshorn, MD  predniSONE (DELTASONE) 10 MG tablet Take 6 tablets (60 mg total) by mouth daily with breakfast. And decrease by one tablet daily 04/29/20   Tat, Onalee Hua, MD  tiotropium (SPIRIVA) 18 MCG inhalation capsule Place 18 mcg into inhaler and inhale daily.    [provider]    Allergies    Patient has no known allergies.  Review of Systems   Review of Systems  Constitutional:  Negative for appetite  change and fatigue.  HENT:  Negative for congestion, ear discharge and sinus pressure.   Eyes:  Negative for discharge.  Respiratory:  Positive for shortness of breath and wheezing. Negative for cough.   Cardiovascular:  Negative for chest pain.  Gastrointestinal:  Negative for abdominal pain and diarrhea.  Genitourinary:  Negative for frequency and hematuria.  Musculoskeletal:  Negative for back pain.  Skin:  Negative for rash.  Neurological:  Negative for seizures and headaches.  Psychiatric/Behavioral:  Negative for hallucinations.    Physical Exam Updated Vital Signs BP (!) 151/90 (BP Location: Left Arm)   Pulse 93   Temp 98 F (36.7 C) (Oral)   Resp (!) 25   Ht 6\' 1"  (1.854 m)   Wt 98.4 kg   SpO2 100%   BMI 28.63 kg/m   Physical Exam Vitals reviewed.  Constitutional:      Appearance: He is well-developed.  HENT:     Head: Normocephalic.     Nose: Nose normal.  Eyes:     General: No scleral icterus.    Conjunctiva/sclera: Conjunctivae normal.  Neck:     Thyroid: No thyromegaly.  Cardiovascular:     Rate and Rhythm: Normal rate and regular rhythm.     Heart sounds: No murmur heard.   No friction rub. No gallop.  Pulmonary:     Breath sounds: No stridor. Wheezing present. No rales.  Chest:     Chest wall: No tenderness.  Abdominal:     General: There is no distension.     Tenderness: There is no abdominal tenderness. There is no rebound.  Musculoskeletal:        General: Normal range of motion.     Cervical back: Neck supple.  Lymphadenopathy:     Cervical: No cervical adenopathy.  Skin:    Findings: No erythema or rash.  Neurological:     Mental Status: He is alert and oriented to person, place, and time.     Motor: No abnormal muscle tone.     Coordination: Coordination normal.  Psychiatric:        Behavior: Behavior normal.    ED Results / Procedures / Treatments   Labs (all labs ordered are listed, but only abnormal results are displayed) Labs  Reviewed  CBC WITH DIFFERENTIAL/PLATELET - Abnormal; Notable for the following components:      Result Value   WBC 11.4 (*)    Neutro Abs 8.6 (*)    All other components within normal limits  COMPREHENSIVE METABOLIC PANEL - Abnormal; Notable for the following components:   Chloride 95 (*)    CO2 33 (*)    Glucose, Bld 108 (*)    BUN 7 (*)    All other components within normal limits  BLOOD GAS, VENOUS - Abnormal; Notable for the following components:   pCO2, Ven 68.2 (*)  Acid-Base Excess 7.3 (*)    All other components within normal limits  RESP PANEL BY RT-PCR (FLU A&B, COVID) [id number]    EKG None  Radiology DG Chest Port 1 View  Result Date: 09/24/2020 CLINICAL DATA:  70 year old male with increasing shortness of breath. On home oxygen. EXAM: PORTABLE CHEST 1 VIEW COMPARISON:  Portable chest 04/26/2020 and earlier. FINDINGS: Portable AP upright view at 0920 hours. Stable lung volumes and mediastinal contours. Coarse bilateral pulmonary interstitial opacity appears chronic and stable, with bullous emphysema changes demonstrated in both lungs on a 2020 CT. No pneumothorax, pulmonary edema, pleural effusion or acute pulmonary opacity. Visualized tracheal air column is within normal limits. No acute osseous abnormality identified. IMPRESSION: Emphysema (ICD10-J43.9). No acute cardiopulmonary abnormality. Electronically Signed   By: Odessa Fleming M.D.   On: 09/24/2020 09:50    Procedures Procedures   Medications Ordered in ED Medications  albuterol (PROVENTIL,VENTOLIN) solution continuous neb (10 mg/hr Nebulization Started During Downtime 09/24/20 1128)  albuterol (VENTOLIN) (5 MG/ML) 0.5% continuous inhalation solution (has no administration in time range)  methylPREDNISolone sodium succinate (SOLU-MEDROL) 125 mg/2 mL injection 125 mg (has no administration in time range)  methylPREDNISolone sodium succinate (SOLU-MEDROL) 40 mg/mL injection 40 mg (has no administration in time range)   guaiFENesin (MUCINEX) 12 hr tablet 600 mg (has no administration in time range)  ipratropium-albuterol (DUONEB) 0.5-2.5 (3) MG/3ML nebulizer solution 3 mL (has no administration in time range)  albuterol (PROVENTIL) (2.5 MG/3ML) 0.083% nebulizer solution 2.5 mg (has no administration in time range)  magnesium sulfate IVPB 2 g 50 mL (0 g Intravenous Stopped 09/24/20 0953)  ipratropium-albuterol (DUONEB) 0.5-2.5 (3) MG/3ML nebulizer solution 3 mL (3 mLs Nebulization Given 09/24/20 0923)  albuterol (PROVENTIL) (2.5 MG/3ML) 0.083% nebulizer solution 2.5 mg (2.5 mg Nebulization Given 09/24/20 4742)  CRITICAL CARE Performed by: Bethann Berkshire Total critical care time: 40 minutes Critical care time was exclusive of separately billable procedures and treating other patients. Critical care was necessary to treat or prevent imminent or life-threatening deterioration. Critical care was time spent personally by me on the following activities: development of treatment plan with patient and/or surrogate as well as nursing, discussions with consultants, evaluation of patient's response to treatment, examination of patient, obtaining history from patient or surrogate, ordering and performing treatments and interventions, ordering and review of laboratory studies, ordering and review of radiographic studies, pulse oximetry and re-evaluation of patient's condition.  Patient continued to wheeze after neb treatment by EMS and the neb treatment in the hospital.  He was becoming more dyspneic so he was started on BiPAP.  Patient will be admitted for COPD exacerbation ED Course  I have reviewed the triage vital signs and the nursing notes.  MDM Rules/Calculators/A&P                           \Patient with COPD exacerbation.  He will be admitted to medicine and presently is using BiPAP Final Clinical Impression(s) / ED Diagnoses Final diagnoses:  COPD exacerbation [facility])    Rx / DC Orders ED Discharge Orders      None        Bethann Berkshire, MD 09/24/20 1251

## 2020-09-24 NOTE — ED Notes (Signed)
Pt tolerating bipap well, sitting at side of bed for comfort of breathing.  Respiratory Therapy at bedside

## 2020-09-24 NOTE — H&P (Signed)
Patient Demographics:    Jacob Pace, is a 70 y.o. male  MRN: [id number]   DOB - [birth year]  Admit Date - 09/24/2020  Outpatient Primary MD for the patient is [facility]   Assessment & Plan:    Principal Problem:   COPD with acute exacerbation (HCC) Active Problems:   Acute on chronic respiratory failure with hypoxia (HCC)   PSVT (paroxysmal supraventricular tachycardia) (HCC)   Essential hypertension    1)Acute COPD Exacerbation- no definite pneumonia,  treat empirically with IV Solu-Medrol , give mucolytics, doxycycline and bronchodilators as ordered, supplemental oxygen as ordered.  -In the ED patient had significantly increased work of breathing despite repeated doses of bronchodilators and steroids-patient was tripoding and required BiPAP mostly for significantly increased work of breathing -May use BiPAP as needed    2)H/o HTN/PSVT--continue Cardizem CD1 180 mg daily  3) social/ethics--- patient is a full code  4) acute on chronic hypoxic respiratory failure due to underlying COPD/emphysema----' -- At baseline PTA patient uses 4 L of oxygen via nasal cannula -Required BiPAP this admission and initially -  Disposition/Need for in-Hospital Stay- patient unable to be discharged at this time due to --- acute COPD exacerbation with significantly increased work of breathing requiring bronchodilators, BiPAP and submental oxygen  Dispo: The patient is from: Home              Anticipated d/c is to: Home              Anticipated d/c date is: 2 days              Patient currently is not medically stable to d/c. Barriers: Not Clinically Stable-    With History of - Reviewed by me  Past Medical History:  Diagnosis Date   COPD (chronic obstructive pulmonary disease) (HCC)    Emphysema     Hypertension       Past Surgical History:  Procedure Laterality Date   HERNIA REPAIR     right inguinal hernia repair  2007      Chief Complaint  Patient presents with   Respiratory Distress      HPI:    Jacob Pace  is a 70 y.o. male with past medical history relevant for COPD/emphysema, PSVT, chronic respiratory failure on 4 L, and HTN who presents with worsening cough wheezing and shortness of breath for about a week or so particularly worse over the last couple days despite increased bronchodilator use patient had no relief at home -In the ED patient had significantly increased work of breathing despite repeated doses of bronchodilators and steroids-patient was tripoding and required BiPAP mostly for significantly increased work of breathing -Denies chest pains palpitations or dizziness, no leg pains or pleuritic symptoms No fever  Or chills ,  No Nausea, Vomiting or Diarrhea -Chest x-ray without acute findings -Creatinine 0.64, LFTs are not elevated -CBC with a white count of 11.4 H&H is 14.3  and 46.0 platelet count of 225, -COVID-negative  Review of systems:    In addition to the HPI above,   A full Review of  Systems was done, all other systems reviewed are negative except as noted above in HPI , .    Social History:  Reviewed by me    Social History   Tobacco Use   Smoking status: Former    Packs/day: 1.00    Years: 40.00    Pack years: 40.00    Types: Cigarettes    Quit date: 02/01/2008    Years since quitting: 12.6   Smokeless tobacco: Never  Substance Use Topics   Alcohol use: Yes    Comment: weekends     Family History :  Reviewed by me    Family History  Problem Relation Age of Onset   Cancer Father        unsure what type   Breast cancer Sister      Home Medications:   Prior to Admission medications   Medication Sig Start Date End Date Taking? Authorizing Provider  albuterol (PROVENTIL HFA;VENTOLIN HFA) 108 (90 BASE) MCG/ACT inhaler  Inhale 2 puffs into the lungs every 6 (six) hours as needed for wheezing or shortness of breath.   Yes [provider]  albuterol (PROVENTIL) (2.5 MG/3ML) 0.083% nebulizer solution Take 6 mLs (5 mg total) by nebulization every 4 (four) hours as needed for wheezing or shortness of breath. 02/08/13  Yes Erick Blinks, MD  Budeson-Glycopyrrol-Formoterol (BREZTRI AEROSPHERE) 160-9-4.8 MCG/ACT AERO Inhale 2 puffs into the lungs in the morning and at bedtime. 06/12/20  Yes Leslye Peer, MD  budesonide-formoterol [facility]) 160-4.5 MCG/ACT inhaler Inhale 2 puffs into the lungs 2 (two) times daily.   Yes [provider]  diltiazem (CARDIZEM CD) 180 MG 24 hr capsule Take 1 capsule (180 mg total) by mouth daily. 04/28/20  Yes Tat, Onalee Hua, MD  tiotropium (SPIRIVA) 18 MCG inhalation capsule Place 18 mcg into inhaler and inhale daily.   Yes [provider]  azithromycin (ZITHROMAX) 250 MG tablet Take 1 tablet (250 mg total) by mouth daily. X 2 days Patient not taking: No sig reported 04/30/20   Catarina Hartshorn, MD  predniSONE (DELTASONE) 10 MG tablet Take 6 tablets (60 mg total) by mouth daily with breakfast. And decrease by one tablet daily Patient not taking: No sig reported 04/29/20   Catarina Hartshorn, MD     Allergies:    No Known Allergies   Physical Exam:   Vitals  Blood pressure 130/90, pulse (!) 103, temperature 98 F (36.7 C), temperature source Oral, resp. rate 20, height 6\' 1"  (1.854 m), weight 98.4 kg, SpO2 96 %.  Physical Examination: General appearance - alert, in acute respiratory distress  mental status - alert, oriented to person, place, and time, somewhat anxious Eyes - sclera anicteric Nose- [place] /Bipap Neck - supple, no JVD elevation , Chest -decreased breath sounds, scattered wheezes, tripoding at times with increased work of breathing  heart - S1 and S2 normal, regular  Abdomen - soft, nontender, nondistended, no masses or organomegaly Neurological - screening  mental status exam normal, neck supple without rigidity, cranial nerves II through XII intact, DTR's normal and symmetric Extremities - no pedal edema noted, intact peripheral pulses  Skin - warm, dry     Data Review:    CBC Recent Labs  Lab 09/24/20 0937  WBC 11.4*  HGB 14.3  HCT 46.0  PLT 225  MCV 95.6  MCH 29.7  MCHC 31.1  RDW 15.2  LYMPHSABS 1.8  MONOABS 0.7  EOSABS 0.2  BASOSABS 0.1   ------------------------------------------------------------------------------------------------------------------  Chemistries  Recent Labs  Lab 09/24/20 0937  NA 139  K 4.0  CL 95*  CO2 33*  GLUCOSE 108*  BUN 7*  CREATININE 0.64  CALCIUM 9.0  AST 37  ALT 28  ALKPHOS 61  BILITOT 0.6   ------------------------------------------------------------------------------------------------------------------ estimated creatinine clearance is 104.6 mL/min (by C-G formula based on SCr of 0.64 mg/dL). ------------------------------------------------------------------------------------------------------------------ No results for input(s): TSH, T4TOTAL, T3FREE, THYROIDAB in the last 72 hours.  Invalid input(s): FREET3   Coagulation profile No results for input(s): INR, PROTIME in the last 168 hours. ------------------------------------------------------------------------------------------------------------------- No results for input(s): DDIMER in the last 72 hours. -------------------------------------------------------------------------------------------------------------------  Cardiac Enzymes No results for input(s): CKMB, TROPONINI, MYOGLOBIN in the last 168 hours.  Invalid input(s): CK ------------------------------------------------------------------------------------------------------------------    Component Value Date/Time   BNP 100.0 04/28/2020 0416      ---------------------------------------------------------------------------------------------------------------  Urinalysis No results found for: COLORURINE, APPEARANCEUR, LABSPEC, PHURINE, GLUCOSEU, HGBUR, BILIRUBINUR, KETONESUR, PROTEINUR, UROBILINOGEN, NITRITE, LEUKOCYTESUR  ----------------------------------------------------------------------------------------------------------------   Imaging Results:    DG Chest Port 1 View  Result Date: 09/24/2020 CLINICAL DATA:  70 year old male with increasing shortness of breath. On home oxygen. EXAM: PORTABLE CHEST 1 VIEW COMPARISON:  Portable chest 04/26/2020 and earlier. FINDINGS: Portable AP upright view at 0920 hours. Stable lung volumes and mediastinal contours. Coarse bilateral pulmonary interstitial opacity appears chronic and stable, with bullous emphysema changes demonstrated in both lungs on a 2020 CT. No pneumothorax, pulmonary edema, pleural effusion or acute pulmonary opacity. Visualized tracheal air column is within normal limits. No acute osseous abnormality identified. IMPRESSION: Emphysema (ICD10-J43.9). No acute cardiopulmonary abnormality. Electronically Signed   By: Odessa Fleming M.D.   On: 09/24/2020 09:50    Radiological Exams on Admission: DG Chest Port 1 View  Result Date: 09/24/2020 CLINICAL DATA:  70 year old male with increasing shortness of breath. On home oxygen. EXAM: PORTABLE CHEST 1 VIEW COMPARISON:  Portable chest 04/26/2020 and earlier. FINDINGS: Portable AP upright view at 0920 hours. Stable lung volumes and mediastinal contours. Coarse bilateral pulmonary interstitial opacity appears chronic and stable, with bullous emphysema changes demonstrated in both lungs on a 2020 CT. No pneumothorax, pulmonary edema, pleural effusion or acute pulmonary opacity. Visualized tracheal air column is within normal limits. No acute osseous abnormality identified. IMPRESSION: Emphysema (ICD10-J43.9). No acute cardiopulmonary  abnormality. Electronically Signed   By: Odessa Fleming M.D.   On: 09/24/2020 09:50    DVT Prophylaxis -SCD   AM Labs Ordered, also please review Full Orders  Family Communication: Admission, patients condition and plan of care including tests being ordered have been discussed with the patient  who indicate understanding and agree with the plan   Code Status - Full Code  Likely DC to  home after improvement in respiratory status  Condition   stable  Shon Hale M.D on 09/24/2020 at 6:24 PM Go to [email or web address] -  for contact info  [facility] - Office  [phone]

## 2020-09-24 NOTE — ED Triage Notes (Signed)
Pt with increased shortness of breath this morning with no improvement with rescue inhalers. Uses home oxygen at 4L at baseline, exertional increased work of breathing.  Given albuterol 5mg  neb, solumedrol 125mg , IV rt ac 20g.  Some improvement with neb.  Hx. COPD, HTN

## 2020-09-24 NOTE — ED Notes (Signed)
Pt requesting re-evaluation of need for bipap, states feels like hes not getting enough oxygen,  RT notified to re-evaluate

## 2020-09-24 NOTE — ED Notes (Signed)
Pt much more relaxed, decreased work of breathing, currently utilizing oxygen at 4L nasal.

## 2020-09-25 ENCOUNTER — Encounter (HOSPITAL_COMMUNITY): Payer: Self-pay | Admitting: Family Medicine

## 2020-09-25 DIAGNOSIS — J441 Chronic obstructive pulmonary disease with (acute) exacerbation: Secondary | ICD-10-CM | POA: Diagnosis not present

## 2020-09-25 LAB — CBC
HCT: 43.5 % (ref 39.0–52.0)
Hemoglobin: 13.4 g/dL (ref 13.0–17.0)
MCH: 29.5 pg (ref 26.0–34.0)
MCHC: 30.8 g/dL (ref 30.0–36.0)
MCV: 95.6 fL (ref 80.0–100.0)
Platelets: 218 10*3/uL (ref 150–400)
RBC: 4.55 MIL/uL (ref 4.22–5.81)
RDW: 15.3 % (ref 11.5–15.5)
WBC: 10.4 10*3/uL (ref 4.0–10.5)
nRBC: 0 % (ref 0.0–0.2)

## 2020-09-25 LAB — BASIC METABOLIC PANEL
Anion gap: 9 (ref 5–15)
BUN: 11 mg/dL (ref 8–23)
CO2: 35 mmol/L — ABNORMAL HIGH (ref 22–32)
Calcium: 9.1 mg/dL (ref 8.9–10.3)
Chloride: 95 mmol/L — ABNORMAL LOW (ref 98–111)
Creatinine, Ser: 0.68 mg/dL (ref 0.61–1.24)
GFR, Estimated: >60 mL/min (ref >60)
Glucose, Bld: 147 mg/dL — ABNORMAL HIGH (ref 70–99)
Potassium: 4.6 mmol/L (ref 3.5–5.1)
Sodium: 139 mmol/L (ref 135–145)

## 2020-09-25 MED ORDER — BUDESONIDE-FORMOTEROL FUMARATE 160-4.5 MCG/ACT IN AERO: 2.0000 | INHALATION_SPRAY | Freq: Two times a day (BID) | RESPIRATORY_TRACT | 12 refills | Status: AC

## 2020-09-25 MED ORDER — DOXYCYCLINE HYCLATE 100 MG PO TABS: 100.0000 mg | ORAL_TABLET | Freq: Two times a day (BID) | ORAL | 0 refills | Status: AC

## 2020-09-25 MED ORDER — GUAIFENESIN ER 600 MG PO TB12: 600.0000 mg | ORAL_TABLET | Freq: Two times a day (BID) | ORAL | 0 refills | Status: AC

## 2020-09-25 MED ORDER — ADULT MULTIVITAMIN W/MINERALS CH: 1.0000 | ORAL_TABLET | Freq: Every day | ORAL | 3 refills | Status: DC

## 2020-09-25 MED ORDER — ALBUTEROL SULFATE HFA 108 (90 BASE) MCG/ACT IN AERS: 2.0000 | INHALATION_SPRAY | Freq: Four times a day (QID) | RESPIRATORY_TRACT | 5 refills | Status: DC | PRN

## 2020-09-25 MED ORDER — FOLIC ACID 1 MG PO TABS: 1.0000 mg | ORAL_TABLET | Freq: Every day | ORAL | 3 refills | Status: AC

## 2020-09-25 MED ORDER — BREZTRI AEROSPHERE 160-9-4.8 MCG/ACT IN AERO: 2.0000 | INHALATION_SPRAY | Freq: Two times a day (BID) | RESPIRATORY_TRACT | 5 refills | Status: DC

## 2020-09-25 MED ORDER — PREDNISONE 20 MG PO TABS: 20.0000 mg | ORAL_TABLET | ORAL | 0 refills | Status: AC

## 2020-09-25 MED ORDER — DILTIAZEM HCL ER COATED BEADS 180 MG PO CP24: 180.0000 mg | ORAL_CAPSULE | Freq: Every day | ORAL | 5 refills | Status: AC

## 2020-09-25 MED ORDER — PANTOPRAZOLE SODIUM 40 MG PO TBEC: 40.0000 mg | DELAYED_RELEASE_TABLET | Freq: Every day | ORAL | 1 refills | Status: DC

## 2020-09-25 MED ORDER — PREDNISONE 20 MG PO TABS
60.0000 mg | ORAL_TABLET | Freq: Once | ORAL | Status: AC
  Administered 2020-09-25: 60 mg via ORAL
  Filled 2020-09-25: qty 3

## 2020-09-25 MED ORDER — TIOTROPIUM BROMIDE MONOHYDRATE 18 MCG IN CAPS: 18.0000 ug | ORAL_CAPSULE | Freq: Every day | RESPIRATORY_TRACT | 5 refills | Status: AC

## 2020-09-25 MED ORDER — ALBUTEROL SULFATE (2.5 MG/3ML) 0.083% IN NEBU: 5.0000 mg | INHALATION_SOLUTION | RESPIRATORY_TRACT | 5 refills | Status: DC | PRN

## 2020-09-25 MED ORDER — HYDROXYZINE HCL 25 MG PO TABS: 25.0000 mg | ORAL_TABLET | Freq: Three times a day (TID) | ORAL | 3 refills | Status: DC | PRN

## 2020-09-25 NOTE — Discharge Summary (Signed)
Jacob Pace, is a 70 y.o. male  DOB September 28, 1950  MRN [id number].  Admission date:  09/24/2020  Admitting Physician  Shon Hale, MD  Discharge Date:  09/25/2020   Primary MD  [facility]  Recommendations for primary care physician for things to follow:   1)Avoid ibuprofen/Advil/Aleve/Motrin/Goody Powders/Naproxen/BC powders/Meloxicam/Diclofenac/Indomethacin and other Nonsteroidal anti-inflammatory medications as these will make you more likely to bleed and can cause stomach ulcers, can also cause Kidney problems.   2)Take Prednisone --- 2 Tablets (40 mg ) q am for 5 days, then 1 tablet (20 mg) daily for another 5 days, then STOP  3)Please follow up with your pulmonologist Dr. Delton Coombes--- in 1 to 2 weeks for recheck and reevaluate  4)you need oxygen at home at 4 L via nasal cannula continuously while awake and while asleep--- smoking or having open fires around oxygen can cause fire, significant injury and death   Admission Diagnosis  COPD exacerbation (HCC) [J44.1] COPD with acute exacerbation (HCC) [J44.1]   Discharge Diagnosis  COPD exacerbation (HCC) [J44.1] COPD with acute exacerbation (HCC) [J44.1]    Principal Problem:   COPD with acute exacerbation (HCC) Active Problems:   Acute on chronic respiratory failure with hypoxia (HCC)   PSVT (paroxysmal supraventricular tachycardia) (HCC)   Essential hypertension      Past Medical History:  Diagnosis Date   COPD (chronic obstructive pulmonary disease) (HCC)    Emphysema    Hypertension     Past Surgical History:  Procedure Laterality Date   HERNIA REPAIR     right inguinal hernia repair  2007     HPI  from the history and physical done on the day of admission:   Vergil Burby  is a 70 y.o. male with past medical history relevant for COPD/emphysema, PSVT, chronic respiratory failure on 4 L, and HTN who presents with  worsening cough wheezing and shortness of breath for about a week or so particularly worse over the last couple days despite increased bronchodilator use patient had no relief at home -In the ED patient had significantly increased work of breathing despite repeated doses of bronchodilators and steroids-patient was tripoding and required BiPAP mostly for significantly increased work of breathing -Denies chest pains palpitations or dizziness, no leg pains or pleuritic symptoms No fever  Or chills ,  No Nausea, Vomiting or Diarrhea -Chest x-ray without acute findings -Creatinine 0.64, LFTs are not elevated -CBC with a white count of 11.4 H&H is 14.3 and 46.0 platelet count of 225, -COVID-negative      Hospital Course:     1)Acute COPD Exacerbation- no definite pneumonia, patient was treated with with IV Solu-Medrol , give mucolytics, doxycycline and bronchodilators as ordered, supplemental oxygen -In the ED patient had significantly increased work of breathing despite repeated doses of bronchodilators and steroids-patient was tripoding and required BiPAP mostly for significantly increased work of breathing -Respiratory status improved significantly patient longer requiring BiPAP, -Doing much better on 4 L of oxygen via nasal cannula with much increased work  of breathing -Okay to discharge home on prednisone to follow-up with outpatient follow-up with pulmonologist Dr. Delton Coombes    2)H/o HTN/PSVT--continue Cardizem CD 180 mg daily   3) acute on chronic hypoxic respiratory failure due to underlying COPD/emphysema----' -- At baseline PTA patient uses 4 L of oxygen via nasal cannula -Required BiPAP this admission and initially -Much improved overall please see #1 above   Disposition--Home   Dispo: The patient is from: Home              Anticipated d/c is to: Home               Discharge Condition: stable  Follow UP   Follow-up Information     Leslye Peer, MD. Schedule an appointment  as soon as possible for a visit in 1 week(s).   Specialty: Pulmonary Disease Contact information: [address] [place] [address] [phone]                  Diet and Activity recommendation:  As advised  Discharge Instructions    Discharge Instructions     Call MD for:  difficulty breathing, headache or visual disturbances   Complete by: As directed    Call MD for:  persistant dizziness or light-headedness   Complete by: As directed    Call MD for:  severe uncontrolled pain   Complete by: As directed    Call MD for:  temperature >100.4   Complete by: As directed    Diet - low sodium heart healthy   Complete by: As directed    Discharge instructions   Complete by: As directed    1)Avoid ibuprofen/Advil/Aleve/Motrin/Goody Powders/Naproxen/BC powders/Meloxicam/Diclofenac/Indomethacin and other Nonsteroidal anti-inflammatory medications as these will make you more likely to bleed and can cause stomach ulcers, can also cause Kidney problems.   2)Take Prednisone --- 2 Tablets (40 mg ) q am for 5 days, then 1 tablet (20 mg) daily for another 5 days, then STOP  3)Please follow up with your pulmonologist Dr. Delton Coombes--- in 1 to 2 weeks for recheck and reevaluate  4)you need oxygen at home at 4 L via nasal cannula continuously while awake and while asleep--- smoking or having open fires around oxygen can cause fire, significant injury and death   Increase activity slowly   Complete by: As directed          Discharge Medications     Allergies as of 09/25/2020   No Known Allergies      Medication List     STOP taking these medications    azithromycin 250 MG tablet Commonly known as: ZITHROMAX       TAKE these medications    albuterol (2.5 MG/3ML) 0.083% nebulizer solution Commonly known as: PROVENTIL Take 6 mLs (5 mg total) by nebulization every 4 (four) hours as needed for wheezing or shortness of breath.   albuterol 108 (90 Base) MCG/ACT  inhaler Commonly known as: VENTOLIN HFA Inhale 2 puffs into the lungs every 6 (six) hours as needed for wheezing or shortness of breath.   Breztri Aerosphere 160-9-4.8 MCG/ACT Aero Generic drug: Budeson-Glycopyrrol-Formoterol Inhale 2 puffs into the lungs in the morning and at bedtime.   budesonide-formoterol 160-4.5 MCG/ACT inhaler Commonly known as: SYMBICORT Inhale 2 puffs into the lungs 2 (two) times daily.   diltiazem 180 MG 24 hr capsule Commonly known as: CARDIZEM CD Take 1 capsule (180 mg total) by mouth daily.   doxycycline 100 MG tablet Commonly known as:  VIBRA-TABS Take 1 tablet (100 mg total) by mouth 2 (two) times daily for 5 days.   folic acid 1 MG tablet Commonly known as: FOLVITE Take 1 tablet (1 mg total) by mouth daily. Start taking on: September 26, 2020   guaiFENesin 600 MG 12 hr tablet Commonly known as: MUCINEX Take 1 tablet (600 mg total) by mouth 2 (two) times daily for 10 days.   hydrOXYzine 25 MG tablet Commonly known as: ATARAX/VISTARIL Take 1 tablet (25 mg total) by mouth 3 (three) times daily as needed for anxiety or nausea.   multivitamin with minerals Tabs tablet Take 1 tablet by mouth daily. Start taking on: September 26, 2020   pantoprazole 40 MG tablet Commonly known as: Protonix Take 1 tablet (40 mg total) by mouth daily.   predniSONE 20 MG tablet Commonly known as: DELTASONE Take 1 tablet (20 mg total) by mouth See admin instructions for 5 days. Take 2 Tablets (40 mg ) q am for 5 days, then 1 tablet (20 mg) daily for another 5 days, then STOP What changed:  medication strength how much to take when to take this additional instructions   tiotropium 18 MCG inhalation capsule Commonly known as: SPIRIVA Place 1 capsule (18 mcg total) into inhaler and inhale daily. What changed: when to take this        Major procedures and Radiology Reports - PLEASE review detailed and final reports for all details, in brief -    DG Chest Port 1  View  Result Date: 09/24/2020 CLINICAL DATA:  70 year old male with increasing shortness of breath. On home oxygen. EXAM: PORTABLE CHEST 1 VIEW COMPARISON:  Portable chest 04/26/2020 and earlier. FINDINGS: Portable AP upright view at 0920 hours. Stable lung volumes and mediastinal contours. Coarse bilateral pulmonary interstitial opacity appears chronic and stable, with bullous emphysema changes demonstrated in both lungs on a 2020 CT. No pneumothorax, pulmonary edema, pleural effusion or acute pulmonary opacity. Visualized tracheal air column is within normal limits. No acute osseous abnormality identified. IMPRESSION: Emphysema (ICD10-J43.9). No acute cardiopulmonary abnormality. Electronically Signed   By: Odessa Fleming M.D.   On: 09/24/2020 09:50    Micro Results   Recent Results (from the past 240 hour(s))  Resp Panel by RT-PCR (Flu A&B, Covid) Nasopharyngeal Swab     Status: None   Collection Time: 09/24/20  9:13 AM   Specimen: Nasopharyngeal Swab; Nasopharyngeal(NP) swabs in vial transport medium  Result Value Ref Range Status   SARS Coronavirus 2 by RT PCR NEGATIVE NEGATIVE Final    Comment: (NOTE) SARS-CoV-2 target nucleic acids are NOT DETECTED.  The SARS-CoV-2 RNA is generally detectable in upper respiratory specimens during the acute phase of infection. The lowest concentration of SARS-CoV-2 viral copies this assay can detect is 138 copies/mL. A negative result does not preclude SARS-Cov-2 infection and should not be used as the sole basis for treatment or other patient management decisions. A negative result may occur with  improper specimen collection/handling, submission of specimen other than nasopharyngeal swab, presence of viral mutation(s) within the areas targeted by this assay, and inadequate number of viral copies(<138 copies/mL). A negative result must be combined with clinical observations, patient history, and epidemiological information. The expected result is  Negative.  Fact Sheet for Patients:  BloggerCourse.com  Fact Sheet for Healthcare Providers:  SeriousBroker.it  This test is no t yet approved or cleared by the [place] FDA and  has been authorized for detection and/or diagnosis of SARS-CoV-2 by FDA under an  Emergency Use Authorization (EUA). This EUA will remain  in effect (meaning this test can be used) for the duration of the COVID-19 declaration under Section 564(b)(1) of the Act, 21 U.S.C.section 360bbb-3(b)(1), unless the authorization is terminated  or revoked sooner.       Influenza A by PCR NEGATIVE NEGATIVE Final   Influenza B by PCR NEGATIVE NEGATIVE Final    Comment: (NOTE) The Xpert Xpress SARS-CoV-2/FLU/RSV plus assay is intended as an aid in the diagnosis of influenza from Nasopharyngeal swab specimens and should not be used as a sole basis for treatment. Nasal washings and aspirates are unacceptable for Xpert Xpress SARS-CoV-2/FLU/RSV testing.  Fact Sheet for Patients: BloggerCourse.comhttps://www.fda.gov/media/152166/download  Fact Sheet for Healthcare Providers: SeriousBroker.ithttps://www.fda.gov/media/152162/download  This test is not yet approved or cleared by the [place] FDA and has been authorized for detection and/or diagnosis of SARS-CoV-2 by FDA under an Emergency Use Authorization (EUA). This EUA will remain in effect (meaning this test can be used) for the duration of the COVID-19 declaration under Section 564(b)(1) of the Act, 21 U.S.C. section 360bbb-3(b)(1), unless the authorization is terminated or revoked.  Performed at [facility], [address] [place], [place] [address]        Today   Subjective    Robyn HaberRobert Feltes today has no new complaints  No fever  Or chills   No Nausea, Vomiting or Diarrhea -- No chest pains or palpitations no dizziness,  -Cough and shortness of breath much better --         Patient has been seen and examined prior to  discharge   Objective   Blood pressure (!) 121/95, pulse 94, temperature 98.2 F (36.8 C), temperature source Oral, resp. rate 20, height 6\' 1"  (1.854 m), weight 98.4 kg, SpO2 97 %.  No intake or output data in the 24 hours ending 09/25/20 1117  Exam Gen:- Awake Alert, no acute distress HEENT:- [place].AT, No sclera icterus Nose -[place] 4L/min Neck-Supple Neck,No JVD,.  Lungs-improved air movement, no wheezing  CV- S1, S2 normal, regular Abd-  +ve B.Sounds, Abd Soft, No tenderness,    Extremity/Skin:- No  edema,   good pulses Psych-affect is appropriate, oriented x3 Neuro-no new focal deficits, no tremors    Data Review   CBC w Diff:  Lab Results  Component Value Date   WBC 10.4 09/25/2020   HGB 13.4 09/25/2020   HCT 43.5 09/25/2020   PLT 218 09/25/2020   LYMPHOPCT 16 09/24/2020   MONOPCT 6 09/24/2020   EOSPCT 2 09/24/2020   BASOPCT 0 09/24/2020    CMP:  Lab Results  Component Value Date   NA 139 09/25/2020   K 4.6 09/25/2020   CL 95 (L) 09/25/2020   CO2 35 (H) 09/25/2020   BUN 11 09/25/2020   CREATININE 0.68 09/25/2020   PROT 7.5 09/24/2020   ALBUMIN 3.8 09/24/2020   BILITOT 0.6 09/24/2020   ALKPHOS 61 09/24/2020   AST 37 09/24/2020   ALT 28 09/24/2020  .   Total Discharge time is about 33 minutes  Shon Haleourage Yousra Ivens M.D on 09/25/2020 at 11:17 AM  Go to [email or web address] -  for contact info  [facility] - Office  [phone]

## 2020-09-25 NOTE — Clinical Social Work Note (Signed)
[facility] notification done [phone]

## 2020-09-25 NOTE — Discharge Instructions (Signed)
1)Avoid ibuprofen/Advil/Aleve/Motrin/Goody Powders/Naproxen/BC powders/Meloxicam/Diclofenac/Indomethacin and other Nonsteroidal anti-inflammatory medications as these will make you more likely to bleed and can cause stomach ulcers, can also cause Kidney problems.   2)Take Prednisone --- 2 Tablets (40 mg ) q am for 5 days, then 1 tablet (20 mg) daily for another 5 days, then STOP  3)Please follow up with your pulmonologist Dr. Delton Coombes--- in 1 to 2 weeks for recheck and reevaluate  4)you need oxygen at home at 4 L via nasal cannula continuously while awake and while asleep--- smoking or having open fires around oxygen can cause fire, significant injury and death

## 2020-10-20 ENCOUNTER — Inpatient Hospital Stay (HOSPITAL_COMMUNITY)
Admission: EM | Admit: 2020-10-20 | Discharge: 2020-10-26 | DRG: 190 | Disposition: A | Discharge status: Home / Self Care | Payer: No Typology Code available for payment source | Attending: Internal Medicine | Admitting: Internal Medicine

## 2020-10-20 DIAGNOSIS — Z23 Encounter for immunization: Secondary | ICD-10-CM

## 2020-10-20 DIAGNOSIS — Z7951 Long term (current) use of inhaled steroids: Secondary | ICD-10-CM

## 2020-10-20 DIAGNOSIS — Z87891 Personal history of nicotine dependence: Secondary | ICD-10-CM

## 2020-10-20 DIAGNOSIS — J441 Chronic obstructive pulmonary disease with (acute) exacerbation: Secondary | ICD-10-CM

## 2020-10-20 DIAGNOSIS — K219 Gastro-esophageal reflux disease without esophagitis: Secondary | ICD-10-CM | POA: Diagnosis present

## 2020-10-20 DIAGNOSIS — E872 Acidosis: Secondary | ICD-10-CM | POA: Diagnosis present

## 2020-10-20 DIAGNOSIS — J9620 Acute and chronic respiratory failure, unspecified whether with hypoxia or hypercapnia: Secondary | ICD-10-CM

## 2020-10-20 DIAGNOSIS — F419 Anxiety disorder, unspecified: Secondary | ICD-10-CM | POA: Diagnosis present

## 2020-10-20 DIAGNOSIS — J9622 Acute and chronic respiratory failure with hypercapnia: Secondary | ICD-10-CM | POA: Diagnosis present

## 2020-10-20 DIAGNOSIS — I471 Supraventricular tachycardia: Secondary | ICD-10-CM | POA: Diagnosis present

## 2020-10-20 DIAGNOSIS — J439 Emphysema, unspecified: Secondary | ICD-10-CM | POA: Diagnosis not present

## 2020-10-20 DIAGNOSIS — J9621 Acute and chronic respiratory failure with hypoxia: Secondary | ICD-10-CM | POA: Diagnosis present

## 2020-10-20 DIAGNOSIS — Z9981 Dependence on supplemental oxygen: Secondary | ICD-10-CM

## 2020-10-20 DIAGNOSIS — I10 Essential (primary) hypertension: Secondary | ICD-10-CM | POA: Diagnosis present

## 2020-10-20 DIAGNOSIS — Z803 Family history of malignant neoplasm of breast: Secondary | ICD-10-CM

## 2020-10-20 DIAGNOSIS — Z20822 Contact with and (suspected) exposure to covid-19: Secondary | ICD-10-CM | POA: Diagnosis present

## 2020-10-20 DIAGNOSIS — Z79899 Other long term (current) drug therapy: Secondary | ICD-10-CM

## 2020-10-20 MED ORDER — METHYLPREDNISOLONE SODIUM SUCC 125 MG IJ SOLR
125.0000 mg | Freq: Once | INTRAMUSCULAR | Status: AC
  Administered 2020-10-21: 125 mg via INTRAVENOUS
  Filled 2020-10-20: qty 2

## 2020-10-20 NOTE — ED Triage Notes (Signed)
Pt c/o sob that started tonight. Pt in on 4lpm continuous o2.

## 2020-10-21 ENCOUNTER — Emergency Department (HOSPITAL_COMMUNITY): Payer: No Typology Code available for payment source

## 2020-10-21 ENCOUNTER — Other Ambulatory Visit: Payer: Self-pay

## 2020-10-21 ENCOUNTER — Encounter (HOSPITAL_COMMUNITY): Payer: Self-pay | Admitting: Emergency Medicine

## 2020-10-21 DIAGNOSIS — Z23 Encounter for immunization: Secondary | ICD-10-CM | POA: Diagnosis not present

## 2020-10-21 DIAGNOSIS — Z79899 Other long term (current) drug therapy: Secondary | ICD-10-CM | POA: Diagnosis not present

## 2020-10-21 DIAGNOSIS — Z9981 Dependence on supplemental oxygen: Secondary | ICD-10-CM | POA: Insufficient documentation

## 2020-10-21 DIAGNOSIS — J9622 Acute and chronic respiratory failure with hypercapnia: Secondary | ICD-10-CM | POA: Diagnosis present

## 2020-10-21 DIAGNOSIS — I10 Essential (primary) hypertension: Secondary | ICD-10-CM

## 2020-10-21 DIAGNOSIS — Z7951 Long term (current) use of inhaled steroids: Secondary | ICD-10-CM | POA: Diagnosis not present

## 2020-10-21 DIAGNOSIS — J9621 Acute and chronic respiratory failure with hypoxia: Secondary | ICD-10-CM | POA: Diagnosis present

## 2020-10-21 DIAGNOSIS — Z87891 Personal history of nicotine dependence: Secondary | ICD-10-CM | POA: Diagnosis not present

## 2020-10-21 DIAGNOSIS — Z20822 Contact with and (suspected) exposure to covid-19: Secondary | ICD-10-CM | POA: Diagnosis present

## 2020-10-21 DIAGNOSIS — Z720 Tobacco use: Secondary | ICD-10-CM | POA: Insufficient documentation

## 2020-10-21 DIAGNOSIS — J441 Chronic obstructive pulmonary disease with (acute) exacerbation: Secondary | ICD-10-CM | POA: Diagnosis present

## 2020-10-21 DIAGNOSIS — R030 Elevated blood-pressure reading, without diagnosis of hypertension: Secondary | ICD-10-CM | POA: Insufficient documentation

## 2020-10-21 DIAGNOSIS — K219 Gastro-esophageal reflux disease without esophagitis: Secondary | ICD-10-CM | POA: Diagnosis present

## 2020-10-21 DIAGNOSIS — I471 Supraventricular tachycardia: Secondary | ICD-10-CM | POA: Diagnosis present

## 2020-10-21 DIAGNOSIS — Z803 Family history of malignant neoplasm of breast: Secondary | ICD-10-CM | POA: Diagnosis not present

## 2020-10-21 DIAGNOSIS — R972 Elevated prostate specific antigen [PSA]: Secondary | ICD-10-CM | POA: Insufficient documentation

## 2020-10-21 DIAGNOSIS — E872 Acidosis: Secondary | ICD-10-CM | POA: Diagnosis present

## 2020-10-21 DIAGNOSIS — F419 Anxiety disorder, unspecified: Secondary | ICD-10-CM | POA: Diagnosis present

## 2020-10-21 DIAGNOSIS — E785 Hyperlipidemia, unspecified: Secondary | ICD-10-CM | POA: Insufficient documentation

## 2020-10-21 DIAGNOSIS — J439 Emphysema, unspecified: Secondary | ICD-10-CM | POA: Diagnosis present

## 2020-10-21 DIAGNOSIS — Z7189 Other specified counseling: Secondary | ICD-10-CM | POA: Insufficient documentation

## 2020-10-21 DIAGNOSIS — R0902 Hypoxemia: Secondary | ICD-10-CM | POA: Insufficient documentation

## 2020-10-21 LAB — COMPREHENSIVE METABOLIC PANEL
ALT: 17 U/L (ref 0–44)
AST: 19 U/L (ref 15–41)
Albumin: 3.5 g/dL (ref 3.5–5.0)
Alkaline Phosphatase: 75 U/L (ref 38–126)
Anion gap: 5 (ref 5–15)
BUN: 13 mg/dL (ref 8–23)
CO2: 32 mmol/L (ref 22–32)
Calcium: 8.5 mg/dL — ABNORMAL LOW (ref 8.9–10.3)
Chloride: 100 mmol/L (ref 98–111)
Creatinine, Ser: 0.86 mg/dL (ref 0.61–1.24)
GFR, Estimated: >60 mL/min (ref >60)
Glucose, Bld: 174 mg/dL — ABNORMAL HIGH (ref 70–99)
Potassium: 4.2 mmol/L (ref 3.5–5.1)
Sodium: 137 mmol/L (ref 135–145)
Total Bilirubin: 0.5 mg/dL (ref 0.3–1.2)
Total Protein: 7.1 g/dL (ref 6.5–8.1)

## 2020-10-21 LAB — CBC WITH DIFFERENTIAL/PLATELET
Abs Immature Granulocytes: 0.08 10*3/uL — ABNORMAL HIGH (ref 0.00–0.07)
Basophils Absolute: 0.1 10*3/uL (ref 0.0–0.1)
Basophils Relative: 1 %
Eosinophils Absolute: 0.3 10*3/uL (ref 0.0–0.5)
Eosinophils Relative: 3 %
HCT: 47 % (ref 39.0–52.0)
Hemoglobin: 14.4 g/dL (ref 13.0–17.0)
Immature Granulocytes: 1 %
Lymphocytes Relative: 17 %
Lymphs Abs: 1.7 10*3/uL (ref 0.7–4.0)
MCH: 29.3 pg (ref 26.0–34.0)
MCHC: 30.6 g/dL (ref 30.0–36.0)
MCV: 95.5 fL (ref 80.0–100.0)
Monocytes Absolute: 0.8 10*3/uL (ref 0.1–1.0)
Monocytes Relative: 8 %
Neutro Abs: 6.9 10*3/uL (ref 1.7–7.7)
Neutrophils Relative %: 70 %
Platelets: 272 10*3/uL (ref 150–400)
RBC: 4.92 MIL/uL (ref 4.22–5.81)
RDW: 14.4 % (ref 11.5–15.5)
WBC: 9.8 10*3/uL (ref 4.0–10.5)
nRBC: 0 % (ref 0.0–0.2)

## 2020-10-21 LAB — CBC
HCT: 44.8 % (ref 39.0–52.0)
Hemoglobin: 13.8 g/dL (ref 13.0–17.0)
MCH: 29.4 pg (ref 26.0–34.0)
MCHC: 30.8 g/dL (ref 30.0–36.0)
MCV: 95.5 fL (ref 80.0–100.0)
Platelets: 244 10*3/uL (ref 150–400)
RBC: 4.69 MIL/uL (ref 4.22–5.81)
RDW: 14.3 % (ref 11.5–15.5)
WBC: 12.9 10*3/uL — ABNORMAL HIGH (ref 4.0–10.5)
nRBC: 0 % (ref 0.0–0.2)

## 2020-10-21 LAB — BLOOD GAS, ARTERIAL
Acid-Base Excess: 4.6 mmol/L — ABNORMAL HIGH (ref 0.0–2.0)
Bicarbonate: 26.2 mmol/L (ref 20.0–28.0)
FIO2: 40
O2 Saturation: 96.7 %
Patient temperature: 37
pCO2 arterial: 73.7 mmHg (ref 32.0–48.0)
pH, Arterial: 7.252 — ABNORMAL LOW (ref 7.350–7.450)
pO2, Arterial: 115 mmHg — ABNORMAL HIGH (ref 83.0–108.0)

## 2020-10-21 LAB — BASIC METABOLIC PANEL
Anion gap: 5 (ref 5–15)
BUN: 10 mg/dL (ref 8–23)
CO2: 35 mmol/L — ABNORMAL HIGH (ref 22–32)
Calcium: 8.8 mg/dL — ABNORMAL LOW (ref 8.9–10.3)
Chloride: 98 mmol/L (ref 98–111)
Creatinine, Ser: 0.81 mg/dL (ref 0.61–1.24)
GFR, Estimated: >60 mL/min (ref >60)
Glucose, Bld: 105 mg/dL — ABNORMAL HIGH (ref 70–99)
Potassium: 4.1 mmol/L (ref 3.5–5.1)
Sodium: 138 mmol/L (ref 135–145)

## 2020-10-21 LAB — MAGNESIUM: Magnesium: 2.1 mg/dL (ref 1.7–2.4)

## 2020-10-21 LAB — BLOOD GAS, VENOUS
Acid-Base Excess: 5.7 mmol/L — ABNORMAL HIGH (ref 0.0–2.0)
Bicarbonate: 27.5 mmol/L (ref 20.0–28.0)
FIO2: 40
O2 Saturation: 88.9 %
Patient temperature: 36.8
pCO2, Ven: 65.7 mmHg — ABNORMAL HIGH (ref 44.0–60.0)
pH, Ven: 7.304 (ref 7.250–7.430)
pO2, Ven: 63 mmHg — ABNORMAL HIGH (ref 32.0–45.0)

## 2020-10-21 LAB — RESP PANEL BY RT-PCR (FLU A&B, COVID) ARPGX2
Influenza A by PCR: NEGATIVE
Influenza B by PCR: NEGATIVE
SARS Coronavirus 2 by RT PCR: NEGATIVE

## 2020-10-21 LAB — PROCALCITONIN: Procalcitonin: <0.1 ng/mL

## 2020-10-21 LAB — BRAIN NATRIURETIC PEPTIDE
B Natriuretic Peptide: 25 pg/mL (ref 0.0–100.0)
B Natriuretic Peptide: 22 pg/mL (ref 0.0–100.0)

## 2020-10-21 LAB — TROPONIN I (HIGH SENSITIVITY)
Troponin I (High Sensitivity): 3 ng/L (ref <18)
Troponin I (High Sensitivity): 4 ng/L (ref <18)

## 2020-10-21 LAB — MRSA NEXT GEN BY PCR, NASAL: MRSA by PCR Next Gen: NOT DETECTED

## 2020-10-21 MED ORDER — IPRATROPIUM BROMIDE 0.02 % IN SOLN
0.5000 mg | RESPIRATORY_TRACT | Status: AC
  Administered 2020-10-21: 0.5 mg via RESPIRATORY_TRACT
  Filled 2020-10-21: qty 2.5

## 2020-10-21 MED ORDER — METOPROLOL TARTRATE 5 MG/5ML IV SOLN: 5.0000 mg | INTRAVENOUS | Status: DC | PRN

## 2020-10-21 MED ORDER — ONDANSETRON HCL 4 MG/2ML IJ SOLN: 4.0000 mg | Freq: Four times a day (QID) | INTRAMUSCULAR | Status: DC | PRN

## 2020-10-21 MED ORDER — PREDNISONE 20 MG PO TABS: 40.0000 mg | ORAL_TABLET | Freq: Every day | ORAL | Status: DC

## 2020-10-21 MED ORDER — CHLORHEXIDINE GLUCONATE CLOTH 2 % EX PADS
6.0000 | MEDICATED_PAD | Freq: Every day | CUTANEOUS | Status: DC
  Administered 2020-10-22 – 2020-10-25 (×3): 6 via TOPICAL

## 2020-10-21 MED ORDER — SENNOSIDES-DOCUSATE SODIUM 8.6-50 MG PO TABS: 1.0000 | ORAL_TABLET | Freq: Every evening | ORAL | Status: DC | PRN

## 2020-10-21 MED ORDER — LORAZEPAM 2 MG/ML IJ SOLN
0.5000 mg | Freq: Once | INTRAMUSCULAR | Status: AC
  Administered 2020-10-21: 0.5 mg via INTRAVENOUS
  Filled 2020-10-21: qty 1

## 2020-10-21 MED ORDER — NICOTINE 21 MG/24HR TD PT24
21.0000 mg | MEDICATED_PATCH | Freq: Every day | TRANSDERMAL | Status: DC
  Administered 2020-10-23: 21 mg via TRANSDERMAL
  Filled 2020-10-21 (×4): qty 1

## 2020-10-21 MED ORDER — DILTIAZEM HCL ER COATED BEADS 180 MG PO CP24
180.0000 mg | ORAL_CAPSULE | Freq: Every day | ORAL | Status: DC
  Administered 2020-10-21 – 2020-10-26 (×6): 180 mg via ORAL
  Filled 2020-10-21 (×6): qty 1

## 2020-10-21 MED ORDER — OXYCODONE HCL 5 MG PO TABS: 5.0000 mg | ORAL_TABLET | ORAL | Status: DC | PRN

## 2020-10-21 MED ORDER — ACETAMINOPHEN 325 MG PO TABS
650.0000 mg | ORAL_TABLET | Freq: Four times a day (QID) | ORAL | Status: DC | PRN
  Administered 2020-10-23 – 2020-10-25 (×3): 650 mg via ORAL
  Filled 2020-10-21 (×3): qty 2

## 2020-10-21 MED ORDER — HEPARIN SODIUM (PORCINE) 5000 UNIT/ML IJ SOLN
5000.0000 [IU] | Freq: Three times a day (TID) | INTRAMUSCULAR | Status: DC
  Administered 2020-10-21 – 2020-10-26 (×16): 5000 [IU] via SUBCUTANEOUS
  Filled 2020-10-21 (×17): qty 1

## 2020-10-21 MED ORDER — ALBUTEROL SULFATE (2.5 MG/3ML) 0.083% IN NEBU: 2.5000 mg | INHALATION_SOLUTION | RESPIRATORY_TRACT | Status: DC | PRN

## 2020-10-21 MED ORDER — HYDROXYZINE HCL 25 MG PO TABS
25.0000 mg | ORAL_TABLET | Freq: Once | ORAL | Status: AC | PRN
  Administered 2020-10-21: 25 mg via ORAL
  Filled 2020-10-21: qty 1

## 2020-10-21 MED ORDER — ALBUTEROL (5 MG/ML) CONTINUOUS INHALATION SOLN
10.0000 mg/h | INHALATION_SOLUTION | Freq: Once | RESPIRATORY_TRACT | Status: AC
  Administered 2020-10-21: 10 mg/h via RESPIRATORY_TRACT
  Filled 2020-10-21: qty 20

## 2020-10-21 MED ORDER — HYDRALAZINE HCL 20 MG/ML IJ SOLN: 10.0000 mg | INTRAMUSCULAR | Status: DC | PRN

## 2020-10-21 MED ORDER — METHYLPREDNISOLONE SODIUM SUCC 40 MG IJ SOLR
40.0000 mg | Freq: Three times a day (TID) | INTRAMUSCULAR | Status: DC
  Administered 2020-10-21 – 2020-10-26 (×16): 40 mg via INTRAVENOUS
  Filled 2020-10-21 (×17): qty 1

## 2020-10-21 MED ORDER — TRAZODONE HCL 50 MG PO TABS
50.0000 mg | ORAL_TABLET | Freq: Every evening | ORAL | Status: DC | PRN
  Administered 2020-10-21 – 2020-10-24 (×3): 50 mg via ORAL
  Filled 2020-10-21 (×3): qty 1

## 2020-10-21 MED ORDER — PANTOPRAZOLE SODIUM 40 MG PO TBEC
40.0000 mg | DELAYED_RELEASE_TABLET | Freq: Every day | ORAL | Status: DC
  Administered 2020-10-21 – 2020-10-26 (×6): 40 mg via ORAL
  Filled 2020-10-21 (×6): qty 1

## 2020-10-21 MED ORDER — ADULT MULTIVITAMIN W/MINERALS CH
1.0000 | ORAL_TABLET | Freq: Every day | ORAL | Status: DC
  Administered 2020-10-21 – 2020-10-26 (×5): 1 via ORAL
  Filled 2020-10-21 (×6): qty 1

## 2020-10-21 MED ORDER — ACETAMINOPHEN 650 MG RE SUPP: 650.0000 mg | Freq: Four times a day (QID) | RECTAL | Status: DC | PRN

## 2020-10-21 MED ORDER — IPRATROPIUM-ALBUTEROL 0.5-2.5 (3) MG/3ML IN SOLN
3.0000 mL | Freq: Four times a day (QID) | RESPIRATORY_TRACT | Status: DC
  Administered 2020-10-21 – 2020-10-24 (×16): 3 mL via RESPIRATORY_TRACT
  Filled 2020-10-21 (×15): qty 3

## 2020-10-21 MED ORDER — MOMETASONE FURO-FORMOTEROL FUM 200-5 MCG/ACT IN AERO
2.0000 | INHALATION_SPRAY | Freq: Two times a day (BID) | RESPIRATORY_TRACT | Status: DC
  Administered 2020-10-21 – 2020-10-24 (×7): 2 via RESPIRATORY_TRACT
  Filled 2020-10-21 (×2): qty 8.8

## 2020-10-21 MED ORDER — METHYLPREDNISOLONE SODIUM SUCC 125 MG IJ SOLR: 125.0000 mg | Freq: Every day | INTRAMUSCULAR | Status: DC

## 2020-10-21 MED ORDER — FOLIC ACID 1 MG PO TABS
1.0000 mg | ORAL_TABLET | Freq: Every day | ORAL | Status: DC
  Administered 2020-10-21 – 2020-10-26 (×6): 1 mg via ORAL
  Filled 2020-10-21 (×6): qty 1

## 2020-10-21 MED ORDER — IPRATROPIUM-ALBUTEROL 0.5-2.5 (3) MG/3ML IN SOLN
3.0000 mL | RESPIRATORY_TRACT | Status: DC | PRN
  Administered 2020-10-21 – 2020-10-24 (×3): 3 mL via RESPIRATORY_TRACT
  Filled 2020-10-21 (×2): qty 3

## 2020-10-21 MED ORDER — INFLUENZA VAC A&B SA ADJ QUAD 0.5 ML IM PRSY
0.5000 mL | PREFILLED_SYRINGE | INTRAMUSCULAR | Status: AC
  Administered 2020-10-23: 0.5 mL via INTRAMUSCULAR
  Filled 2020-10-21: qty 0.5

## 2020-10-21 MED ORDER — ONDANSETRON HCL 4 MG PO TABS: 4.0000 mg | ORAL_TABLET | Freq: Four times a day (QID) | ORAL | Status: DC | PRN

## 2020-10-21 NOTE — H&P (Signed)
TRH H&P    Patient Demographics:    Jacob Pace, is a 70 y.o. male  MRN: [id number]  DOB - [birth year]  Admit Date - 10/20/2020  Referring MD/NP/PA: Oletta Cohn  Outpatient Primary MD for the patient is [facility]  Patient coming from: Home  Chief complaint-shortness of breath   HPI:    Jacob Pace  is a 70 y.o. male, with history of COPD, emphysema, hypertension, who is 4 L nasal cannula at baseline presents ED with a chief complaint of shortness of breath.  Onset of shortness of breath was tonight prior to arrival.  Patient presented to the ED he had pursed lips and tripod breathing.  He was speaking in 4-5 word sentences.  He was not hypoxic, but he had increased work of breathing and impending sense of doom so started on the BiPAP.  He had significant air hunger and was screaming during 1 breathing treatment, so he was given Ativan to calm him down.  Since then his blood pressure has recovered nicely.  He has been tolerating the BiPAP.  At the time of my exam he opens his eyes, and answers yes to everything I ask, including " not really sure why you are here today right?"  To which he answered yes.  History is taken from ED staff and chart review. Patient had admitted to cough with greenish-yellow sputum.  He tried inhalers at home without improvement.  He noted wheezing at home.  Patient noticed a chest tightness/epigastric pain.  He reports the pain is still there.  Again he tried breathing treatments without relief.  He was put on BiPAP at arrival.  Further history cannot be obtained at this time.  The ED Afebrile, heart rate 100-1 01, respiratory 25, blood pressure 111/77 at admission, satting at 99% on BiPAP VBG shows pH 7.252 and hypercapnia Troponin normal at 3 and 4 No leukocytosis, hemoglobin 14.4, platelets 272 Chemistry panel is mostly unremarkable aside from hyperglycemia Negative  respiratory panel Chest x-ray shows moderate to marked severity emphysematous lung disease.  Very mild bibasilar atelectasis Patient given albuterol, ipratropium, once COVID test is back patient started on continuous albuterol.  He was screaming and had air hunger.  He was then started on BiPAP.  Patient was also given Solu-Medrol. Admission requested for further management of COPD exacerbation  Review of systems:    Unfortunately patient is not able to provide review of systems at this time due to altered mental status secondary to medication given in the ED    Past History of the following :    Past Medical History:  Diagnosis Date   COPD (chronic obstructive pulmonary disease) (HCC)    Emphysema    Hypertension       Past Surgical History:  Procedure Laterality Date   HERNIA REPAIR     right inguinal hernia repair  2007      Social History:      Social History   Tobacco Use   Smoking status: Former    Packs/day: 1.00  Years: 40.00    Pack years: 40.00    Types: Cigarettes    Quit date: 02/01/2008    Years since quitting: 12.7   Smokeless tobacco: Never  Substance Use Topics   Alcohol use: Yes    Comment: weekends       Family History :     Family History  Problem Relation Age of Onset   Cancer Father        unsure what type   Breast cancer Sister       Home Medications:   Prior to Admission medications   Medication Sig Start Date End Date Taking? Authorizing Provider  albuterol (PROVENTIL) (2.5 MG/3ML) 0.083% nebulizer solution Take 6 mLs (5 mg total) by nebulization every 4 (four) hours as needed for wheezing or shortness of breath. 09/25/20   Mariea Clonts, Courage, MD  albuterol (VENTOLIN HFA) 108 (90 Base) MCG/ACT inhaler Inhale 2 puffs into the lungs every 6 (six) hours as needed for wheezing or shortness of breath. 09/25/20   Mariea Clonts, Courage, MD  Budeson-Glycopyrrol-Formoterol (BREZTRI AEROSPHERE) 160-9-4.8 MCG/ACT AERO Inhale 2 puffs into the lungs  in the morning and at bedtime. 09/25/20   Shon Hale, MD  budesonide-formoterol (SYMBICORT) 160-4.5 MCG/ACT inhaler Inhale 2 puffs into the lungs 2 (two) times daily. 09/25/20   Shon Hale, MD  diltiazem (CARDIZEM CD) 180 MG 24 hr capsule Take 1 capsule (180 mg total) by mouth daily. 09/25/20   Shon Hale, MD  folic acid (FOLVITE) 1 MG tablet Take 1 tablet (1 mg total) by mouth daily. 09/26/20   Shon Hale, MD  hydrOXYzine (ATARAX/VISTARIL) 25 MG tablet Take 1 tablet (25 mg total) by mouth 3 (three) times daily as needed for anxiety or nausea. 09/25/20   Shon Hale, MD  Multiple Vitamin (MULTIVITAMIN WITH MINERALS) TABS tablet Take 1 tablet by mouth daily. 09/26/20   Shon Hale, MD  pantoprazole (PROTONIX) 40 MG tablet Take 1 tablet (40 mg total) by mouth daily. 09/25/20 09/25/21  Shon Hale, MD  tiotropium (SPIRIVA) 18 MCG inhalation capsule Place 1 capsule (18 mcg total) into inhaler and inhale daily. 09/25/20   Shon Hale, MD     Allergies:    No Known Allergies   Physical Exam:   Vitals  Blood pressure 138/85, pulse 91, temperature 98.2 F (36.8 C), temperature source Axillary, resp. rate 17, height 6\' 1"  (1.854 m), weight 99 kg, SpO2 98 %.  1.  General: Patient lying supine in bed,  no acute distress   2. Psychiatric: Somnolent, oriented x3, mood and behavior normal for situation, pleasant and cooperative with exam   3. Neurologic: Speech and language are normal, face is symmetric, moves all 4 extremities voluntarily, at baseline without acute deficits on limited exam   4. HEENMT:  Head is atraumatic, normocephalic, pupils reactive to light, neck is supple, trachea is midline, mucous membranes are moist   5. Respiratory : Bilateral wheezing, no rhonchi, rales, no cyanosis, no increase in work of breathing or accessory muscle use   6. Cardiovascular : Heart rate normal, rhythm is regular, no murmurs, rubs or gallops, no peripheral edema,  peripheral pulses palpated   7. Gastrointestinal:  Abdomen is soft, nondistended, nontender to palpation bowel sounds active, no masses or organomegaly palpated   8. Skin:  Skin is warm, dry and intact without rashes, acute lesions, or ulcers on limited exam   9.Musculoskeletal:  No acute deformities or trauma, no asymmetry in tone, no peripheral edema, peripheral pulses palpated, no tenderness to palpation in  the extremities     Data Review:    CBC Recent Labs  Lab 10/20/20 2355 10/21/20 0439  WBC 9.8 12.9*  HGB 14.4 13.8  HCT 47.0 44.8  PLT 272 244  MCV 95.5 95.5  MCH 29.3 29.4  MCHC 30.6 30.8  RDW 14.4 14.3  LYMPHSABS 1.7  --   MONOABS 0.8  --   EOSABS 0.3  --   BASOSABS 0.1  --    ------------------------------------------------------------------------------------------------------------------  Results for orders placed or performed during the hospital encounter of 10/20/20 (from the past 48 hour(s))  CBC with Differential/Platelet     Status: Abnormal   Collection Time: 10/20/20 11:55 PM  Result Value Ref Range   WBC 9.8 4.0 - 10.5 K/uL   RBC 4.92 4.22 - 5.81 MIL/uL   Hemoglobin 14.4 13.0 - 17.0 g/dL   HCT 25.9 56.3 - 87.5 %   MCV 95.5 80.0 - 100.0 fL   MCH 29.3 26.0 - 34.0 pg   MCHC 30.6 30.0 - 36.0 g/dL   RDW 64.3 32.9 - 51.8 %   Platelets 272 150 - 400 K/uL   nRBC 0.0 0.0 - 0.2 %   Neutrophils Relative % 70 %   Neutro Abs 6.9 1.7 - 7.7 K/uL   Lymphocytes Relative 17 %   Lymphs Abs 1.7 0.7 - 4.0 K/uL   Monocytes Relative 8 %   Monocytes Absolute 0.8 0.1 - 1.0 K/uL   Eosinophils Relative 3 %   Eosinophils Absolute 0.3 0.0 - 0.5 K/uL   Basophils Relative 1 %   Basophils Absolute 0.1 0.0 - 0.1 K/uL   Immature Granulocytes 1 %   Abs Immature Granulocytes 0.08 (H) 0.00 - 0.07 K/uL    Comment: Performed at [facility], [address], [place], [place] [address]  Basic metabolic panel     Status: Abnormal   Collection Time: 10/20/20 11:55 PM  Result  Value Ref Range   Sodium 138 135 - 145 mmol/L   Potassium 4.1 3.5 - 5.1 mmol/L   Chloride 98 98 - 111 mmol/L   CO2 35 (H) 22 - 32 mmol/L   Glucose, Bld 105 (H) 70 - 99 mg/dL    Comment: Glucose reference range applies only to samples taken after fasting for at least 8 hours.   BUN 10 8 - 23 mg/dL   Creatinine, Ser 0.63 0.61 - 1.24 mg/dL   Calcium 8.8 (L) 8.9 - 10.3 mg/dL   GFR, Estimated >01 >60 mL/min    Comment: (NOTE) Calculated using the CKD-EPI Creatinine Equation (2021)    Anion gap 5 5 - 15    Comment: Performed at [facility], [address], [place], [place] [address]  Troponin I (High Sensitivity)     Status: None   Collection Time: 10/20/20 11:55 PM  Result Value Ref Range   Troponin I (High Sensitivity) 3 <18 ng/L    Comment: (NOTE) Elevated high sensitivity troponin I (hsTnI) values and significant  changes across serial measurements may suggest ACS but many other  chronic and acute conditions are known to elevate hsTnI results.  Refer to the "Links" section for chest pain algorithms and additional  guidance. Performed at [facility], [address] [place], [place] [address]   Brain natriuretic peptide     Status: None   Collection Time: 10/20/20 11:55 PM  Result Value Ref Range   B Natriuretic Peptide 25.0 0.0 - 100.0 pg/mL    Comment: Performed at [facility], [address], [place],  [place] [address]  Resp Panel by RT-PCR (Flu A&B, Covid) Nasopharyngeal Swab     Status: None   Collection Time: 10/20/20 11:56 PM   Specimen: Nasopharyngeal Swab; Nasopharyngeal(NP) swabs in vial transport medium  Result Value Ref Range   SARS Coronavirus 2 by RT PCR NEGATIVE NEGATIVE    Comment: (NOTE) SARS-CoV-2 target nucleic acids are NOT DETECTED.  The SARS-CoV-2 RNA is generally detectable in upper respiratory specimens during the acute phase of infection. The lowest concentration of SARS-CoV-2 viral copies this assay can detect is 138 copies/mL. A negative result does  not preclude SARS-Cov-2 infection and should not be used as the sole basis for treatment or other patient management decisions. A negative result may occur with  improper specimen collection/handling, submission of specimen other than nasopharyngeal swab, presence of viral mutation(s) within the areas targeted by this assay, and inadequate number of viral copies(<138 copies/mL). A negative result must be combined with clinical observations, patient history, and epidemiological information. The expected result is Negative.  Fact Sheet for Patients:  BloggerCourse.com  Fact Sheet for Healthcare Providers:  SeriousBroker.it  This test is no t yet approved or cleared by the [place] FDA and  has been authorized for detection and/or diagnosis of SARS-CoV-2 by FDA under an Emergency Use Authorization (EUA). This EUA will remain  in effect (meaning this test can be used) for the duration of the COVID-19 declaration under Section 564(b)(1) of the Act, 21 U.S.C.section 360bbb-3(b)(1), unless the authorization is terminated  or revoked sooner.       Influenza A by PCR NEGATIVE NEGATIVE   Influenza B by PCR NEGATIVE NEGATIVE    Comment: (NOTE) The Xpert Xpress SARS-CoV-2/FLU/RSV plus assay is intended as an aid in the diagnosis of influenza from Nasopharyngeal swab specimens and should not be used as a sole basis for treatment. Nasal washings and aspirates are unacceptable for Xpert Xpress SARS-CoV-2/FLU/RSV testing.  Fact Sheet for Patients: BloggerCourse.com  Fact Sheet for Healthcare Providers: SeriousBroker.it  This test is not yet approved or cleared by the [place] FDA and has been authorized for detection and/or diagnosis of SARS-CoV-2 by FDA under an Emergency Use Authorization (EUA). This EUA will remain in effect (meaning this test can be used) for the duration of  the COVID-19 declaration under Section 564(b)(1) of the Act, 21 U.S.C. section 360bbb-3(b)(1), unless the authorization is terminated or revoked.  Performed at [facility], [address], [place], [place] [address]   Troponin I (High Sensitivity)     Status: None   Collection Time: 10/21/20  1:53 AM  Result Value Ref Range   Troponin I (High Sensitivity) 4 <18 ng/L    Comment: (NOTE) Elevated high sensitivity troponin I (hsTnI) values and significant  changes across serial measurements may suggest ACS but many other  chronic and acute conditions are known to elevate hsTnI results.  Refer to the "Links" section for chest pain algorithms and additional  guidance. Performed at [facility], [address] [place], [place] [address]   Blood gas, arterial     Status: Abnormal   Collection Time: 10/21/20  2:58 AM  Result Value Ref Range   FIO2 40.00    pH, Arterial 7.252 (L) 7.350 - 7.450   pCO2 arterial 73.7 (HH) 32.0 - 48.0 mmHg    Comment: CRITICAL RESULT CALLED TO, READ BACK BY AND VERIFIED WITH: walker,[email or web address]  by matthews, b 9.21.22    pO2, Arterial 115 (H) 83.0 - 108.0 mmHg   Bicarbonate 26.2 20.0 -  28.0 mmol/L   Acid-Base Excess 4.6 (H) 0.0 - 2.0 mmol/L   O2 Saturation 96.7 %   Patient temperature 37.0    Allens test (pass/fail) PASS PASS    Comment: Performed at [facility], [address] [place], [place] [address]  Blood gas, venous     Status: Abnormal   Collection Time: 10/21/20  4:39 AM  Result Value Ref Range   FIO2 40.00    pH, Ven 7.304 7.250 - 7.430   pCO2, Ven 65.7 (H) 44.0 - 60.0 mmHg   pO2, Ven 63.0 (H) 32.0 - 45.0 mmHg   Bicarbonate 27.5 20.0 - 28.0 mmol/L   Acid-Base Excess 5.7 (H) 0.0 - 2.0 mmol/L   O2 Saturation 88.9 %   Patient temperature 36.8     Comment: Performed at [facility], [address] [place], [place] [address]  Comprehensive metabolic panel     Status: Abnormal   Collection Time: 10/21/20  4:39 AM  Result Value Ref Range   Sodium  137 135 - 145 mmol/L   Potassium 4.2 3.5 - 5.1 mmol/L   Chloride 100 98 - 111 mmol/L   CO2 32 22 - 32 mmol/L   Glucose, Bld 174 (H) 70 - 99 mg/dL    Comment: Glucose reference range applies only to samples taken after fasting for at least 8 hours.   BUN 13 8 - 23 mg/dL   Creatinine, Ser 4.81 0.61 - 1.24 mg/dL   Calcium 8.5 (L) 8.9 - 10.3 mg/dL   Total Protein 7.1 6.5 - 8.1 g/dL   Albumin 3.5 3.5 - 5.0 g/dL   AST 19 15 - 41 U/L   ALT 17 0 - 44 U/L   Alkaline Phosphatase 75 38 - 126 U/L   Total Bilirubin 0.5 0.3 - 1.2 mg/dL   GFR, Estimated >85 >63 mL/min    Comment: (NOTE) Calculated using the CKD-EPI Creatinine Equation (2021)    Anion gap 5 5 - 15    Comment: Performed at [facility], [address], [place], [place] [address]  Magnesium     Status: None   Collection Time: 10/21/20  4:39 AM  Result Value Ref Range   Magnesium 2.1 1.7 - 2.4 mg/dL    Comment: Performed at [facility], [address] [place], [place] [address]  CBC     Status: Abnormal   Collection Time: 10/21/20  4:39 AM  Result Value Ref Range   WBC 12.9 (H) 4.0 - 10.5 K/uL   RBC 4.69 4.22 - 5.81 MIL/uL   Hemoglobin 13.8 13.0 - 17.0 g/dL   HCT 58.8 50.2 - 77.4 %   MCV 95.5 80.0 - 100.0 fL   MCH 29.4 26.0 - 34.0 pg   MCHC 30.8 30.0 - 36.0 g/dL   RDW 12.8 78.6 - 76.7 %   Platelets 244 150 - 400 K/uL   nRBC 0.0 0.0 - 0.2 %    Comment: Performed at [facility], [address], [place], [place] [address]    Chemistries  Recent Labs  Lab 10/20/20 2355 10/21/20 0439  NA 138 137  K 4.1 4.2  CL 98 100  CO2 35* 32  GLUCOSE 105* 174*  BUN 10 13  CREATININE 0.81 0.86  CALCIUM 8.8* 8.5*  MG  --  2.1  AST  --  19  ALT  --  17  ALKPHOS  --  75  BILITOT  --  0.5    ------------------------------------------------------------------------------------------------------------------  ------------------------------------------------------------------------------------------------------------------ GFR: Estimated Creatinine Clearance: 97.5 mL/min (  by C-G formula based on SCr of 0.86 mg/dL). Liver Function Tests: Recent Labs  Lab 10/21/20 0439  AST 19  ALT 17  ALKPHOS 75  BILITOT 0.5  PROT 7.1  ALBUMIN 3.5   No results for input(s): LIPASE, AMYLASE in the last 168 hours. No results for input(s): AMMONIA in the last 168 hours. Coagulation Profile: No results for input(s): INR, PROTIME in the last 168 hours. Cardiac Enzymes: No results for input(s): CKTOTAL, CKMB, CKMBINDEX, TROPONINI in the last 168 hours. BNP (last 3 results) No results for input(s): PROBNP in the last 8760 hours. HbA1C: No results for input(s): HGBA1C in the last 72 hours. CBG: No results for input(s): GLUCAP in the last 168 hours. Lipid Profile: No results for input(s): CHOL, HDL, LDLCALC, TRIG, CHOLHDL, LDLDIRECT in the last 72 hours. Thyroid Function Tests: No results for input(s): TSH, T4TOTAL, FREET4, T3FREE, THYROIDAB in the last 72 hours. Anemia Panel: No results for input(s): VITAMINB12, FOLATE, FERRITIN, TIBC, IRON, RETICCTPCT in the last 72 hours.  --------------------------------------------------------------------------------------------------------------- Urine analysis: No results found for: COLORURINE, APPEARANCEUR, LABSPEC, PHURINE, GLUCOSEU, HGBUR, BILIRUBINUR, KETONESUR, PROTEINUR, UROBILINOGEN, NITRITE, LEUKOCYTESUR    Imaging Results:    DG Chest Port 1 View  Result Date: 10/21/2020 CLINICAL DATA:  Shortness of breath. EXAM: PORTABLE CHEST 1 VIEW COMPARISON:  September 24, 2020 FINDINGS: The lungs are hyperinflated. Moderate to marked severity emphysematous lung disease is noted. Very mild areas of atelectasis are seen within the bilateral lung bases.  Stable biapical pleural thickening is noted. There is no evidence of a pleural effusion or pneumothorax. The heart size and mediastinal contours are within normal limits. The visualized skeletal structures are unremarkable. IMPRESSION: 1. Moderate to marked severity emphysematous lung disease. 2. Very mild bibasilar atelectasis. Electronically Signed   By: Aram Candela M.D.   On: 10/21/2020 00:15    My personal review of EKG: Rhythm NSR, Rate 98 /min, QTc 463 ,no Acute ST changes   Assessment & Plan:    Active Problems:   Acute on chronic respiratory failure with hypercapnia (HCC)   Essential (primary) hypertension   COPD exacerbation (HCC)   COPD exacerbation With CO2 retention, acidosis, air hunger Started on BiPAP Continue scheduled DuoNebs, as needed albuterol Continue steroid Chest x-ray is without pneumonia finding Continue to monitor Acute respiratory failure with hypercapnia Blood gas shows a PCO2 of 73 Secondary to COPD exacerbation Continue plan as above Chest pain Trop 3, 4 EKG is without ischemic changes Likely secondary to COPD exacerbation Monitor on telemetry Hypertension Continue diltiazem Epigastric pain Continue PPI    DVT Prophylaxis-   Heparin- SCDs   AM Labs Ordered, also please review Full Orders  Family Communication: No family at bedside  Code Status: Full  Admission status: Inpatient :The appropriate admission status for this patient is INPATIENT. Inpatient status is judged to be reasonable and necessary in order to provide the required intensity of service to ensure the patient's safety. The patient's presenting symptoms, physical exam findings, and initial radiographic and laboratory data in the context of their chronic comorbidities is felt to place them at high risk for further clinical deterioration. Furthermore, it is not anticipated that the patient will be medically stable for discharge from the hospital within 2 midnights of admission.  The following factors support the admission status of inpatient.     The patient's presenting symptoms include dyspnea The worrisome physical exam findings include reported pursed lip breathing, tripoding, use of accessory muscles, speaking 4-5 word sentences. The initial radiographic  and laboratory data are worrisome because of hypercapnia. The chronic co-morbidities include hypertension, GERD.       * I certify that at the point of admission it is my clinical judgment that the patient will require inpatient hospital care spanning beyond 2 midnights from the point of admission due to high intensity of service, high risk for further deterioration and high frequency of surveillance required.*  Time spent in minutes : 65   Rayner Erman B Zierle-Ghosh DO

## 2020-10-21 NOTE — ED Notes (Signed)
Spoke with Dr. Nelson Chimes regarding pt status. States okay to take pt off bipap intermittently for small meals, sips of water, and po meds if able to tolerated. RT made aware and updated on plan.

## 2020-10-21 NOTE — Progress Notes (Signed)
Patient taken off of BPAP and placed on 4LNC with sats of 100% and is tolerating well at this time. BIPAP is at bedside if needed. RN aware. Will continue to monitor as needed.

## 2020-10-21 NOTE — ED Notes (Signed)
Pt resting comfortably on bipap.

## 2020-10-21 NOTE — ED Notes (Addendum)
After starting on neb tx pt co difficulty breathing, fear of impending doom, feels like he cannot get air, and chest pain increased to 7\10 but was 3/10 on arrival. Dr. Oletta Cohn notified. Orders received, RT notified. Posterior breath sounds are grossly diminished. Anterior breath sounds are end expiratory wheezing and diminished.

## 2020-10-21 NOTE — Progress Notes (Signed)
PROGRESS NOTE    Jacob Pace  DDU:[id number] DOB: 16-Oct-1950 DOA: 10/20/2020 PCP: [facility]   Brief Narrative:  70 year old with history of COPD, emphysema, HTN, chronic hypoxia on 4 L nasal cannula admitted for shortness of breath.  Initially had to be placed on BiPAP and given Ativan for anxiety.  VBG showed hypercapnic respiratory acidosis, chest x-ray showed moderate to marked severe emphysematous disease with bibasilar atelectasis.   Assessment & Plan:   Active Problems:   Acute on chronic respiratory failure with hypercapnia (HCC)   Essential (primary) hypertension   COPD exacerbation (HCC)  Acute hypoxic and hypercapnic respiratory failure requiring BiPAP Acute COPD exacerbation  Chronic hypoxia on 4 L nasal cannula - Currently patient is on BiPAP.  Bronchodilators scheduled and as needed.  Steroids.  I-S/flutter.  Adjust BiPAP settings as necessary.  His airway still very tight with minimal air movement up and down but improved -BNP and ProCal - neg.   Essential hypertension - Cardizem 180 milligrams daily  Epigastric pain -PPI    DVT prophylaxis: Hep SQ Code Status: Full  Family Communication:  Wrong contact number listed in the chart.   Status is: Inpatient  Remains inpatient appropriate because:Inpatient level of care appropriate due to severity of illness.  Patient still has significantly abnormal breath sounds and needs to remain on BiPAP.  Dispo: The patient is from: Home              Anticipated d/c is to: Home              Patient currently is not medically stable to d/c.   Difficult to place patient No        Subjective: Feels a lot better compared compared to when he was admitted but still has severe dyspnea even with minimal movement.  He confirms that he is a full code.  Review of Systems Otherwise negative except as per HPI, including: General = no fevers, chills, dizziness,  fatigue HEENT/EYES = negative for loss of  vision, double vision, blurred vision,  sore throa Cardiovascular= negative for chest pain, palpitation Respiratory/lungs= negative for shortness of breath, cough, wheezing; hemoptysis,  Gastrointestinal= negative for nausea, vomiting, abdominal pain Genitourinary= negative for Dysuria MSK = Negative for arthralgia, myalgias Neurology= Negative for headache, numbness, tingling  Psychiatry= Negative for suicidal and homocidal ideation Skin= Negative for Rash   Examination:  General exam: Appears calm and comfortable, remains on BiPAP Respiratory system: Diffuse bilateral diminished breath sounds Cardiovascular system: S1 & S2 heard, RRR. No JVD, murmurs, rubs, gallops or clicks. No pedal edema. Gastrointestinal system: Abdomen is nondistended, soft and nontender. No organomegaly or masses felt. Normal bowel sounds heard. Central nervous system: Alert and oriented. No focal neurological deficits. Extremities: Symmetric 5 x 5 power. Skin: No rashes, lesions or ulcers Psychiatry: Judgement and insight appear normal. Mood & affect appropriate.     Objective: Vitals:   10/21/20 0645 10/21/20 0700 10/21/20 0729 10/21/20 0733  BP: 112/71 107/72  (!) 143/88  Pulse: 84 85  91  Resp: 19 18  (!) 21  Temp:      TempSrc:      SpO2: 98% 95% 100% 100%  Weight:      Height:       No intake or output data in the 24 hours ending 10/21/20 0819 Filed Weights   10/21/20 0001  Weight: 99 kg     Data Reviewed:   CBC: Recent Labs  Lab 10/20/20 2355 10/21/20 0439  WBC 9.8 12.9*  NEUTROABS 6.9  --   HGB 14.4 13.8  HCT 47.0 44.8  MCV 95.5 95.5  PLT 272 244   Basic Metabolic Panel: Recent Labs  Lab 10/20/20 2355 10/21/20 0439  NA 138 137  K 4.1 4.2  CL 98 100  CO2 35* 32  GLUCOSE 105* 174*  BUN 10 13  CREATININE 0.81 0.86  CALCIUM 8.8* 8.5*  MG  --  2.1   GFR: Estimated Creatinine Clearance: 97.5 mL/min (by C-G formula based on SCr of 0.86 mg/dL). Liver Function  Tests: Recent Labs  Lab 10/21/20 0439  AST 19  ALT 17  ALKPHOS 75  BILITOT 0.5  PROT 7.1  ALBUMIN 3.5   No results for input(s): LIPASE, AMYLASE in the last 168 hours. No results for input(s): AMMONIA in the last 168 hours. Coagulation Profile: No results for input(s): INR, PROTIME in the last 168 hours. Cardiac Enzymes: No results for input(s): CKTOTAL, CKMB, CKMBINDEX, TROPONINI in the last 168 hours. BNP (last 3 results) No results for input(s): PROBNP in the last 8760 hours. HbA1C: No results for input(s): HGBA1C in the last 72 hours. CBG: No results for input(s): GLUCAP in the last 168 hours. Lipid Profile: No results for input(s): CHOL, HDL, LDLCALC, TRIG, CHOLHDL, LDLDIRECT in the last 72 hours. Thyroid Function Tests: No results for input(s): TSH, T4TOTAL, FREET4, T3FREE, THYROIDAB in the last 72 hours. Anemia Panel: No results for input(s): VITAMINB12, FOLATE, FERRITIN, TIBC, IRON, RETICCTPCT in the last 72 hours. Sepsis Labs: No results for input(s): PROCALCITON, LATICACIDVEN in the last 168 hours.  Recent Results (from the past 240 hour(s))  Resp Panel by RT-PCR (Flu A&B, Covid) Nasopharyngeal Swab     Status: None   Collection Time: 10/20/20 11:56 PM   Specimen: Nasopharyngeal Swab; Nasopharyngeal(NP) swabs in vial transport medium  Result Value Ref Range Status   SARS Coronavirus 2 by RT PCR NEGATIVE NEGATIVE Final    Comment: (NOTE) SARS-CoV-2 target nucleic acids are NOT DETECTED.  The SARS-CoV-2 RNA is generally detectable in upper respiratory specimens during the acute phase of infection. The lowest concentration of SARS-CoV-2 viral copies this assay can detect is 138 copies/mL. A negative result does not preclude SARS-Cov-2 infection and should not be used as the sole basis for treatment or other patient management decisions. A negative result may occur with  improper specimen collection/handling, submission of specimen other than nasopharyngeal swab,  presence of viral mutation(s) within the areas targeted by this assay, and inadequate number of viral copies(<138 copies/mL). A negative result must be combined with clinical observations, patient history, and epidemiological information. The expected result is Negative.  Fact Sheet for Patients:  BloggerCourse.com  Fact Sheet for Healthcare Providers:  SeriousBroker.it  This test is no t yet approved or cleared by the [place] FDA and  has been authorized for detection and/or diagnosis of SARS-CoV-2 by FDA under an Emergency Use Authorization (EUA). This EUA will remain  in effect (meaning this test can be used) for the duration of the COVID-19 declaration under Section 564(b)(1) of the Act, 21 U.S.C.section 360bbb-3(b)(1), unless the authorization is terminated  or revoked sooner.       Influenza A by PCR NEGATIVE NEGATIVE Final   Influenza B by PCR NEGATIVE NEGATIVE Final    Comment: (NOTE) The Xpert Xpress SARS-CoV-2/FLU/RSV plus assay is intended as an aid in the diagnosis of influenza from Nasopharyngeal swab specimens and should not be used as a sole basis for treatment. Nasal washings  and aspirates are unacceptable for Xpert Xpress SARS-CoV-2/FLU/RSV testing.  Fact Sheet for Patients: BloggerCourse.com  Fact Sheet for Healthcare Providers: SeriousBroker.it  This test is not yet approved or cleared by the [place] FDA and has been authorized for detection and/or diagnosis of SARS-CoV-2 by FDA under an Emergency Use Authorization (EUA). This EUA will remain in effect (meaning this test can be used) for the duration of the COVID-19 declaration under Section 564(b)(1) of the Act, 21 U.S.C. section 360bbb-3(b)(1), unless the authorization is terminated or revoked.  Performed at [facility], [address] [place], [place] [address]          Radiology  Studies: [facility] 1 View  Result Date: 10/21/2020 CLINICAL DATA:  Shortness of breath. EXAM: PORTABLE CHEST 1 VIEW COMPARISON:  September 24, 2020 FINDINGS: The lungs are hyperinflated. Moderate to marked severity emphysematous lung disease is noted. Very mild areas of atelectasis are seen within the bilateral lung bases. Stable biapical pleural thickening is noted. There is no evidence of a pleural effusion or pneumothorax. The heart size and mediastinal contours are within normal limits. The visualized skeletal structures are unremarkable. IMPRESSION: 1. Moderate to marked severity emphysematous lung disease. 2. Very mild bibasilar atelectasis. Electronically Signed   By: Aram Candela M.D.   On: 10/21/2020 00:15        Scheduled Meds:  diltiazem  180 mg Oral Daily   folic acid  1 mg Oral Daily   heparin  5,000 Units Subcutaneous Q8H   ipratropium-albuterol  3 mL Nebulization Q6H   methylPREDNISolone (SOLU-MEDROL) injection  125 mg Intravenous Daily   Followed by   Melene Muller ON 10/22/2020] predniSONE  40 mg Oral Q breakfast   mometasone-formoterol  2 puff Inhalation BID   multivitamin with minerals  1 tablet Oral Daily   nicotine  21 mg Transdermal Daily   pantoprazole  40 mg Oral Daily   Continuous Infusions:   LOS: 0 days   Time spent= 35 mins    Cliffard Hair Joline Maxcy, MD [facility]  If 7PM-7AM, please contact night-coverage  10/21/2020, 8:19 AM

## 2020-10-21 NOTE — ED Provider Notes (Addendum)
[facility] EMERGENCY DEPARTMENT Provider Note   CSN: [id number] Arrival date & time: 10/20/20  2349     History Chief Complaint  Patient presents with   Shortness of Breath    Jacob Pace is a 70 y.o. male.  Patient presents to the emergency department for evaluation of shortness of breath.  Patient reports history of COPD.  He has been having increased wheezing and shortness of breath with chest tightness tonight.  Patient reports using his nebulizer machine without improvement.  Patient is chronically on 4 L by nasal cannula.  He is asking for increased oxygen.      Past Medical History:  Diagnosis Date   COPD (chronic obstructive pulmonary disease) (HCC)    Emphysema    Hypertension     Patient Active Problem List   Diagnosis Date Noted   Dependence on supplemental oxygen when ambulating 10/21/2020   Elevated blood-pressure reading without diagnosis of hypertension 10/21/2020   Hypoxemia 10/21/2020   Other and unspecified hyperlipidemia 10/21/2020   Other specified counseling 10/21/2020   Raised prostate specific antigen 10/21/2020   Tobacco user 10/21/2020   Gastroesophageal reflux disease 10/21/2020   Leukocytosis 04/27/2020   COPD exacerbation (HCC) 04/27/2020   Chronic respiratory failure with hypoxia, on home O2 therapy (HCC) 02/03/2020   Essential (primary) hypertension 02/03/2020   Chronic obstructive pulmonary disease with (acute) lower respiratory infection (HCC) 02/03/2020   Acute on chronic respiratory failure with hypoxia (HCC) 02/08/2013   PSVT (paroxysmal supraventricular tachycardia) (HCC) 02/07/2013   Chronic obstructive pulmonary disease (HCC) 02/05/2013   BRONCHITIS, ACUTE 04/02/2010   GASTRITIS 04/17/2008   GASTROINTESTINAL XRAY, ABNORMAL 04/11/2008   EMPHYSEMA 06/15/2007   Nonspecific (abnormal) findings on radiological and other examination of body structure 06/15/2007   ABNORMAL CHEST XRAY 06/15/2007    Past Surgical History:   Procedure Laterality Date   HERNIA REPAIR     right inguinal hernia repair  2007       Family History  Problem Relation Age of Onset   Cancer Father        unsure what type   Breast cancer Sister     Social History   Tobacco Use   Smoking status: Former    Packs/day: 1.00    Years: 40.00    Pack years: 40.00    Types: Cigarettes    Quit date: 02/01/2008    Years since quitting: 12.7   Smokeless tobacco: Never  Vaping Use   Vaping Use: Never used  Substance Use Topics   Alcohol use: Yes    Comment: weekends   Drug use: No    Home Medications Prior to Admission medications   Medication Sig Start Date End Date Taking? Authorizing Provider  albuterol (PROVENTIL) (2.5 MG/3ML) 0.083% nebulizer solution Take 6 mLs (5 mg total) by nebulization every 4 (four) hours as needed for wheezing or shortness of breath. 09/25/20   Mariea Clonts, Courage, MD  albuterol (VENTOLIN HFA) 108 (90 Base) MCG/ACT inhaler Inhale 2 puffs into the lungs every 6 (six) hours as needed for wheezing or shortness of breath. 09/25/20   Mariea Clonts, Courage, MD  Budeson-Glycopyrrol-Formoterol (BREZTRI AEROSPHERE) 160-9-4.8 MCG/ACT AERO Inhale 2 puffs into the lungs in the morning and at bedtime. 09/25/20   Shon Hale, MD  budesonide-formoterol (SYMBICORT) 160-4.5 MCG/ACT inhaler Inhale 2 puffs into the lungs 2 (two) times daily. 09/25/20   Shon Hale, MD  diltiazem (CARDIZEM CD) 180 MG 24 hr capsule Take 1 capsule (180 mg total) by mouth daily. 09/25/20  Shon Hale, MD  folic acid (FOLVITE) 1 MG tablet Take 1 tablet (1 mg total) by mouth daily. 09/26/20   Shon Hale, MD  hydrOXYzine (ATARAX/VISTARIL) 25 MG tablet Take 1 tablet (25 mg total) by mouth 3 (three) times daily as needed for anxiety or nausea. 09/25/20   Shon Hale, MD  Multiple Vitamin (MULTIVITAMIN WITH MINERALS) TABS tablet Take 1 tablet by mouth daily. 09/26/20   Shon Hale, MD  pantoprazole (PROTONIX) 40 MG tablet Take 1  tablet (40 mg total) by mouth daily. 09/25/20 09/25/21  Shon Hale, MD  tiotropium (SPIRIVA) 18 MCG inhalation capsule Place 1 capsule (18 mcg total) into inhaler and inhale daily. 09/25/20   Shon Hale, MD    Allergies    Patient has no known allergies.  Review of Systems   Review of Systems  Respiratory:  Positive for chest tightness and shortness of breath.   All other systems reviewed and are negative.  Physical Exam Updated Vital Signs BP 107/72   Pulse 96   Temp 98.2 F (36.8 C) (Axillary)   Resp 20   Ht 6\' 1"  (1.854 m)   Wt 99 kg   SpO2 97%   BMI 28.80 kg/m   Physical Exam Vitals and nursing note reviewed.  Constitutional:      General: He is not in acute distress.    Appearance: Normal appearance. He is well-developed.  HENT:     Head: Normocephalic and atraumatic.     Right Ear: Hearing normal.     Left Ear: Hearing normal.     Nose: Nose normal.  Eyes:     Conjunctiva/sclera: Conjunctivae normal.     Pupils: Pupils are equal, round, and reactive to light.  Cardiovascular:     Rate and Rhythm: Regular rhythm.     Heart sounds: S1 normal and S2 normal. No murmur heard.   No friction rub. No gallop.  Pulmonary:     Effort: Tachypnea, accessory muscle usage and respiratory distress present.     Breath sounds: Decreased breath sounds, wheezing and rhonchi present.  Chest:     Chest wall: No tenderness.  Abdominal:     General: Bowel sounds are normal.     Palpations: Abdomen is soft.     Tenderness: There is no abdominal tenderness. There is no guarding or rebound. Negative signs include Murphy's sign and McBurney's sign.     Hernia: No hernia is present.  Musculoskeletal:        General: Normal range of motion.     Cervical back: Normal range of motion and neck supple.  Skin:    General: Skin is warm and dry.     Findings: No rash.  Neurological:     Mental Status: He is alert and oriented to person, place, and time.     GCS: GCS eye subscore  is 4. GCS verbal subscore is 5. GCS motor subscore is 6.     Cranial Nerves: No cranial nerve deficit.     Sensory: No sensory deficit.     Coordination: Coordination normal.  Psychiatric:        Speech: Speech normal.        Behavior: Behavior normal.        Thought Content: Thought content normal.    ED Results / Procedures / Treatments   Labs (all labs ordered are listed, but only abnormal results are displayed) Labs Reviewed  CBC WITH DIFFERENTIAL/PLATELET - Abnormal; Notable for the following components:  Result Value   Abs Immature Granulocytes 0.08 (*)    All other components within normal limits  BASIC METABOLIC PANEL - Abnormal; Notable for the following components:   CO2 35 (*)    Glucose, Bld 105 (*)    Calcium 8.8 (*)    All other components within normal limits  BLOOD GAS, ARTERIAL - Abnormal; Notable for the following components:   pH, Arterial 7.252 (*)    pCO2 arterial 73.7 (*)    pO2, Arterial 115 (*)    Acid-Base Excess 4.6 (*)    All other components within normal limits  RESP PANEL BY RT-PCR (FLU A&B, COVID) ARPGX2  BRAIN NATRIURETIC PEPTIDE  TROPONIN I (HIGH SENSITIVITY)  TROPONIN I (HIGH SENSITIVITY)    EKG EKG Interpretation  Date/Time:  Wednesday October 21 2020 03:20:35 EDT Ventricular Rate:  98 PR Interval:  152 QRS Duration: 95 QT Interval:  362 QTC Calculation: 463 R Axis:   66 Text Interpretation: Sinus rhythm Normal ECG Confirmed by Gilda Crease ([id number]) on 10/21/2020 3:34:37 AM  Radiology DG Chest Port 1 View  Result Date: 10/21/2020 CLINICAL DATA:  Shortness of breath. EXAM: PORTABLE CHEST 1 VIEW COMPARISON:  September 24, 2020 FINDINGS: The lungs are hyperinflated. Moderate to marked severity emphysematous lung disease is noted. Very mild areas of atelectasis are seen within the bilateral lung bases. Stable biapical pleural thickening is noted. There is no evidence of a pleural effusion or pneumothorax. The heart size and  mediastinal contours are within normal limits. The visualized skeletal structures are unremarkable. IMPRESSION: 1. Moderate to marked severity emphysematous lung disease. 2. Very mild bibasilar atelectasis. Electronically Signed   By: Aram Candela M.D.   On: 10/21/2020 00:15    Procedures Procedures   Medications Ordered in ED Medications  methylPREDNISolone sodium succinate (SOLU-MEDROL) 125 mg/2 mL injection 125 mg (125 mg Intravenous Given 10/21/20 0015)  albuterol (PROVENTIL,VENTOLIN) solution continuous neb (10 mg/hr Nebulization Given 10/21/20 0202)  ipratropium (ATROVENT) nebulizer solution 0.5 mg (0.5 mg Nebulization Given 10/21/20 0202)  LORazepam (ATIVAN) injection 0.5 mg (0.5 mg Intravenous Given 10/21/20 0227)    ED Course  I have reviewed the triage vital signs and the nursing notes.  Pertinent labs & imaging results that were available during my care of the patient were reviewed by me and considered in my medical decision making (see chart for details).    MDM Rules/Calculators/A&P                           Patient presents to the emergency department for evaluation of shortness of breath.  Patient has a history of COPD.  Patient denies any associated cough or chest congestion.  Patient has been experiencing increased shortness of breath, wheezing and chest tightness tonight.  Patient in mild distress on arrival.  Work-up has been reassuring.  Chest x-ray without evidence of pneumonia or other pathology other than chronic COPD changes.  EKG without ischemic changes.  First troponin negative.  BNP normal, no signs of congestive heart failure.  COVID and influenza negative.  Patient's presentation consistent with COPD exacerbation.  He has tachypnea, decreased air movement with wheezing and rhonchi throughout both lung fields.  Patient started on continuous nebulizer treatment but did not significantly improve.  Patient beginning to complain of worsening shortness of breath,  placed on BiPAP, given Ativan for anxiolysis.  Will require hospitalization for further management of COPD exacerbation.  CRITICAL CARE Performed by: Gilda Crease  Total critical care time: 33 minutes  Critical care time was exclusive of separately billable procedures and treating other patients.  Critical care was necessary to treat or prevent imminent or life-threatening deterioration.  Critical care was time spent personally by me on the following activities: development of treatment plan with patient and/or surrogate as well as nursing, discussions with consultants, evaluation of patient's response to treatment, examination of patient, obtaining history from patient or surrogate, ordering and performing treatments and interventions, ordering and review of laboratory studies, ordering and review of radiographic studies, pulse oximetry and re-evaluation of patient's condition.  Final Clinical Impression(s) / ED Diagnoses Final diagnoses:  COPD exacerbation (HCC)  Acute on chronic respiratory failure, unspecified whether with hypoxia or hypercapnia [facility])    Rx / DC Orders ED Discharge Orders     None        Brooks Stotz, Canary Brim, MD 10/21/20 0231    Gilda Crease, MD 10/21/20 [phone]

## 2020-10-21 NOTE — Progress Notes (Signed)
RT attempted to wean patient off of BIPAP but patient was unable to tolerate stating he still felt as though his breathing was off and unable to complete sentences. Placed patient back on BIPAP after breathing treatments given. Will continue to monitor as needed.

## 2020-10-21 NOTE — Progress Notes (Signed)
Patient has been off Bipap for majority of afternoon.  Patient on home regimen of 4L with sat of 98%, no distress noted at this time.  Bipap at bedside if needed.

## 2020-10-21 NOTE — Progress Notes (Signed)
OT Cancellation Note  Patient Details Name: Jacob Pace MRN: [id number] DOB: 08-19-1950   Cancelled Treatment:    Reason Eval/Treat Not Completed: Fatigue/lethargy limiting ability to participate. Per document review pt was given Ativan this morning likely indicating pt will be lethargic for evaluation. Will attempt to see pt later as time permits.   Audry Kauzlarich OT, MOT   Danie Chandler 10/21/2020, 9:58 AM

## 2020-10-22 DIAGNOSIS — J441 Chronic obstructive pulmonary disease with (acute) exacerbation: Secondary | ICD-10-CM | POA: Diagnosis not present

## 2020-10-22 DIAGNOSIS — J9622 Acute and chronic respiratory failure with hypercapnia: Secondary | ICD-10-CM | POA: Diagnosis not present

## 2020-10-22 DIAGNOSIS — I10 Essential (primary) hypertension: Secondary | ICD-10-CM | POA: Diagnosis not present

## 2020-10-22 LAB — BASIC METABOLIC PANEL
Anion gap: 6 (ref 5–15)
BUN: 12 mg/dL (ref 8–23)
CO2: 33 mmol/L — ABNORMAL HIGH (ref 22–32)
Calcium: 8.8 mg/dL — ABNORMAL LOW (ref 8.9–10.3)
Chloride: 98 mmol/L (ref 98–111)
Creatinine, Ser: 0.66 mg/dL (ref 0.61–1.24)
GFR, Estimated: >60 mL/min (ref >60)
Glucose, Bld: 148 mg/dL — ABNORMAL HIGH (ref 70–99)
Potassium: 4.3 mmol/L (ref 3.5–5.1)
Sodium: 137 mmol/L (ref 135–145)

## 2020-10-22 LAB — CBC
HCT: 40.6 % (ref 39.0–52.0)
Hemoglobin: 12.4 g/dL — ABNORMAL LOW (ref 13.0–17.0)
MCH: 28.6 pg (ref 26.0–34.0)
MCHC: 30.5 g/dL (ref 30.0–36.0)
MCV: 93.8 fL (ref 80.0–100.0)
Platelets: 252 10*3/uL (ref 150–400)
RBC: 4.33 MIL/uL (ref 4.22–5.81)
RDW: 14.1 % (ref 11.5–15.5)
WBC: 11.1 10*3/uL — ABNORMAL HIGH (ref 4.0–10.5)
nRBC: 0 % (ref 0.0–0.2)

## 2020-10-22 LAB — MAGNESIUM: Magnesium: 2.3 mg/dL (ref 1.7–2.4)

## 2020-10-22 NOTE — Progress Notes (Signed)
PROGRESS NOTE    Jacob Pace  DPO:[id number] DOB: 08-22-1950 DOA: 10/20/2020 PCP: [facility]   Brief Narrative:  70 year old with history of COPD, emphysema, HTN, chronic hypoxia on 4 L nasal cannula admitted for shortness of breath.  Initially had to be placed on BiPAP and given Ativan for anxiety.  VBG showed hypercapnic respiratory acidosis, chest x-ray showed moderate to marked severe emphysematous disease with bibasilar atelectasis.   Assessment & Plan:   Active Problems:   Acute on chronic respiratory failure with hypercapnia (HCC)   Essential (primary) hypertension   COPD exacerbation (HCC)  Acute hypoxic and hypercapnic respiratory failure requiring BiPAP Acute COPD exacerbation  Chronic hypoxia on 4 L nasal cannula - He still off-and-on requiring BiPAP.  Bronchodilators scheduled and as needed.  Steroids.  I-S/flutter.  Adjust BiPAP settings as necessary.   -BNP and ProCal - neg.   Essential hypertension - Cardizem 180 milligrams daily  Epigastric pain -PPI    DVT prophylaxis: Hep SQ Code Status: Full  Family Communication: Wrong contact number introversive unable to contact the patient's family  Status is: Inpatient  Remains inpatient appropriate because:Inpatient level of care appropriate due to severity of illness.  Patient still has significantly abnormal breath sounds and needs to remain on BiPAP.  Dispo: The patient is from: Home              Anticipated d/c is to: Home              Patient currently is not medically stable to d/c.   Difficult to place patient No        Subjective: Over last 24 hours patient felt better and was on nasal cannula but this morning while working with physical therapy he got really short of breath and had to be placed on BiPAP.  Review of Systems Otherwise negative except as per HPI, including: General: Denies fever, chills, night sweats or unintended weight loss. Resp: Denies cough, wheezing,  shortness of breath. Cardiac: Denies hemoptysis GI: Denies abdominal pain, nausea, vomiting, diarrhea or constipation GU: Denies dysuria, frequency, hesitancy or incontinence MS: Denies muscle aches, joint pain or swelling Neuro: Denies headache, neurologic deficits (focal weakness, numbness, tingling), abnormal gait Psych: Denies anxiety, depression, SI/HI/AVH Skin: Denies new rashes or lesions ID: Denies sick contacts, exotic exposures, travel Examination:  Constitutional: Not in acute distress Respiratory: Diffuse bilateral diminished breath sounds Cardiovascular: Normal sinus rhythm, no rubs Abdomen: Nontender nondistended good bowel sounds Musculoskeletal: No edema noted Skin: No rashes seen Neurologic: CN 2-12 grossly intact.  And nonfocal Psychiatric: Normal judgment and insight. Alert and oriented x 3. Normal mood.   Objective: Vitals:   10/22/20 0900 10/22/20 1000 10/22/20 1100 10/22/20 1200  BP: 126/60 128/80 138/77 (!) 141/68  Pulse: 93 89 85 91  Resp:      Temp:   98.4 F (36.9 C)   TempSrc:   Oral   SpO2: 95% 98% 96%   Weight:      Height:        Intake/Output Summary (Last 24 hours) at 10/22/2020 1206 Last data filed at 10/22/2020 1000 Gross per 24 hour  Intake 240 ml  Output 1500 ml  Net -1260 ml   Filed Weights   10/21/20 0001 10/21/20 1220  Weight: 99 kg 99 kg     Data Reviewed:   CBC: Recent Labs  Lab 10/20/20 2355 10/21/20 0439 10/22/20 0449  WBC 9.8 12.9* 11.1*  NEUTROABS 6.9  --   --  HGB 14.4 13.8 12.4*  HCT 47.0 44.8 40.6  MCV 95.5 95.5 93.8  PLT 272 244 252   Basic Metabolic Panel: Recent Labs  Lab 10/20/20 2355 10/21/20 0439 10/22/20 0449  NA 138 137 137  K 4.1 4.2 4.3  CL 98 100 98  CO2 35* 32 33*  GLUCOSE 105* 174* 148*  BUN 10 13 12   CREATININE 0.81 0.86 0.66  CALCIUM 8.8* 8.5* 8.8*  MG  --  2.1 2.3   GFR: Estimated Creatinine Clearance: 106.5 mL/min (by C-G formula based on SCr of 0.66 mg/dL). Liver Function  Tests: Recent Labs  Lab 10/21/20 0439  AST 19  ALT 17  ALKPHOS 75  BILITOT 0.5  PROT 7.1  ALBUMIN 3.5   No results for input(s): LIPASE, AMYLASE in the last 168 hours. No results for input(s): AMMONIA in the last 168 hours. Coagulation Profile: No results for input(s): INR, PROTIME in the last 168 hours. Cardiac Enzymes: No results for input(s): CKTOTAL, CKMB, CKMBINDEX, TROPONINI in the last 168 hours. BNP (last 3 results) No results for input(s): PROBNP in the last 8760 hours. HbA1C: No results for input(s): HGBA1C in the last 72 hours. CBG: No results for input(s): GLUCAP in the last 168 hours. Lipid Profile: No results for input(s): CHOL, HDL, LDLCALC, TRIG, CHOLHDL, LDLDIRECT in the last 72 hours. Thyroid Function Tests: No results for input(s): TSH, T4TOTAL, FREET4, T3FREE, THYROIDAB in the last 72 hours. Anemia Panel: No results for input(s): VITAMINB12, FOLATE, FERRITIN, TIBC, IRON, RETICCTPCT in the last 72 hours. Sepsis Labs: Recent Labs  Lab 10/21/20 0439  PROCALCITON <0.10    Recent Results (from the past 240 hour(s))  Resp Panel by RT-PCR (Flu A&B, Covid) Nasopharyngeal Swab     Status: None   Collection Time: 10/20/20 11:56 PM   Specimen: Nasopharyngeal Swab; Nasopharyngeal(NP) swabs in vial transport medium  Result Value Ref Range Status   SARS Coronavirus 2 by RT PCR NEGATIVE NEGATIVE Final    Comment: (NOTE) SARS-CoV-2 target nucleic acids are NOT DETECTED.  The SARS-CoV-2 RNA is generally detectable in upper respiratory specimens during the acute phase of infection. The lowest concentration of SARS-CoV-2 viral copies this assay can detect is 138 copies/mL. A negative result does not preclude SARS-Cov-2 infection and should not be used as the sole basis for treatment or other patient management decisions. A negative result may occur with  improper specimen collection/handling, submission of specimen other than nasopharyngeal swab, presence of viral  mutation(s) within the areas targeted by this assay, and inadequate number of viral copies(<138 copies/mL). A negative result must be combined with clinical observations, patient history, and epidemiological information. The expected result is Negative.  Fact Sheet for Patients:  10/22/20  Fact Sheet for Healthcare Providers:  BloggerCourse.com  This test is no t yet approved or cleared by the [place] FDA and  has been authorized for detection and/or diagnosis of SARS-CoV-2 by FDA under an Emergency Use Authorization (EUA). This EUA will remain  in effect (meaning this test can be used) for the duration of the COVID-19 declaration under Section 564(b)(1) of the Act, 21 U.S.C.section 360bbb-3(b)(1), unless the authorization is terminated  or revoked sooner.       Influenza A by PCR NEGATIVE NEGATIVE Final   Influenza B by PCR NEGATIVE NEGATIVE Final    Comment: (NOTE) The Xpert Xpress SARS-CoV-2/FLU/RSV plus assay is intended as an aid in the diagnosis of influenza from Nasopharyngeal swab specimens and should not be used as a sole  basis for treatment. Nasal washings and aspirates are unacceptable for Xpert Xpress SARS-CoV-2/FLU/RSV testing.  Fact Sheet for Patients: BloggerCourse.com  Fact Sheet for Healthcare Providers: SeriousBroker.it  This test is not yet approved or cleared by the [place] FDA and has been authorized for detection and/or diagnosis of SARS-CoV-2 by FDA under an Emergency Use Authorization (EUA). This EUA will remain in effect (meaning this test can be used) for the duration of the COVID-19 declaration under Section 564(b)(1) of the Act, 21 U.S.C. section 360bbb-3(b)(1), unless the authorization is terminated or revoked.  Performed at [facility], [address], [place], [place] [address]   MRSA Next Gen by PCR, Nasal     Status: None    Collection Time: 10/21/20 12:30 PM   Specimen: Nasal Mucosa; Nasal Swab  Result Value Ref Range Status   MRSA by PCR Next Gen NOT DETECTED NOT DETECTED Final    Comment: (NOTE) The GeneXpert MRSA Assay (FDA approved for NASAL specimens only), is one component of a comprehensive MRSA colonization surveillance program. It is not intended to diagnose MRSA infection nor to guide or monitor treatment for MRSA infections. Test performance is not FDA approved in patients less than 80 years old. Performed at [facility], [address], [place], [place] [address]          Radiology Studies: [facility] Chest [facility] 1 View  Result Date: 10/21/2020 CLINICAL DATA:  Shortness of breath. EXAM: PORTABLE CHEST 1 VIEW COMPARISON:  September 24, 2020 FINDINGS: The lungs are hyperinflated. Moderate to marked severity emphysematous lung disease is noted. Very mild areas of atelectasis are seen within the bilateral lung bases. Stable biapical pleural thickening is noted. There is no evidence of a pleural effusion or pneumothorax. The heart size and mediastinal contours are within normal limits. The visualized skeletal structures are unremarkable. IMPRESSION: 1. Moderate to marked severity emphysematous lung disease. 2. Very mild bibasilar atelectasis. Electronically Signed   By: Aram Candela M.D.   On: 10/21/2020 00:15        Scheduled Meds:  Chlorhexidine Gluconate Cloth  6 each Topical Q0600   diltiazem  180 mg Oral Daily   folic acid  1 mg Oral Daily   heparin  5,000 Units Subcutaneous Q8H   influenza vaccine adjuvanted  0.5 mL Intramuscular Tomorrow-1000   ipratropium-albuterol  3 mL Nebulization Q6H   methylPREDNISolone (SOLU-MEDROL) injection  40 mg Intravenous Q8H   mometasone-formoterol  2 puff Inhalation BID   multivitamin with minerals  1 tablet Oral Daily   nicotine  21 mg Transdermal Daily   pantoprazole  40 mg Oral Daily   Continuous Infusions:   LOS: 1 day   Time spent= 35  mins    Malcome Ambrocio Joline Maxcy, MD [facility]  If 7PM-7AM, please contact night-coverage  10/22/2020, 12:06 PM

## 2020-10-22 NOTE — Evaluation (Signed)
Occupational Therapy Evaluation Patient Details Name: ICHOLAS IRBY MRN: [id number] DOB: [birth year] Today's Date: 10/22/2020   History of Present Illness Rafferty Postlewait  is a 70 y.o. male, with history of COPD, emphysema, hypertension, who is 4 L nasal cannula at baseline presents ED with a chief complaint of shortness of breath.  Onset of shortness of breath was tonight prior to arrival.  Patient presented to the ED he had pursed lips and tripod breathing.  He was speaking in 4-5 word sentences.  He was not hypoxic, but he had increased work of breathing and impending sense of doom so started on the BiPAP.  He had significant air hunger and was screaming during 1 breathing treatment, so he was given Ativan to calm him down.  Since then his blood pressure has recovered nicely.  He has been tolerating the BiPAP.  At the time of my exam he opens his eyes, and answers yes to everything I ask, including " not really sure why you are here today right?"  To which he answered yes.  History is taken from ED staff and chart review.  Patient had admitted to cough with greenish-yellow sputum.  He tried inhalers at home without improvement.  He noted wheezing at home.  Patient noticed a chest tightness/epigastric pain.  He reports the pain is still there.  Again he tried breathing treatments without relief.  He was put on BiPAP at arrival.   Clinical Impression   Pt agreeable to OT/PT co-evaluation. Pt able to complete bed mobility and ambulation within the room with MOD I level of assist simply due to need for rest breaks. Pt O2 saturation noted to briefly go down to 88% when pt was talking. Pt appears to be at or near baseline levels for ADL's with the primary limiting factor being SOB. Pt did report recent visual change of seeing spots. Optometrist evaluation would likely be beneficial. Pt is not recommended for further acute OT services and will be discharged to care of nursing staff for the remaining length of  stay.      Recommendations for follow up therapy are one component of a multi-disciplinary discharge planning process, led by the attending physician.  Recommendations may be updated based on patient status, additional functional criteria and insurance authorization.   Follow Up Recommendations  No OT follow up    Equipment Recommendations  Tub/shower seat    Recommendations for Other Services  (Optometrist evaluation due to pt report of seeing spots recently.)     Precautions / Restrictions Restrictions Weight Bearing Restrictions: No      Mobility Bed Mobility Overal bed mobility: Modified Independent             General bed mobility comments: breaks needed    Transfers Overall transfer level: Needs assistance Equipment used: None Transfers: Sit to/from Stand;Stand Pivot Transfers Sit to Stand: Modified independent (Device/Increase time) Stand pivot transfers: Modified independent (Device/Increase time)       General transfer comment: Mod I due to need for rest breaks due to SOB.    Balance Overall balance assessment: Independent                                         ADL either performed or assessed with clinical judgement   ADL Overall ADL's : Modified independent (Pt requires rest breaks and extended time for ADL's due to SOB.)  Vision Baseline Vision/History: 1 Wears glasses (reading glasses) Ability to See in Adequate Light: 1 Impaired Patient Visual Report: Other (comment) (Pt reports seeing spots recently. Pt able to read board without difficulty.)                  Pertinent Vitals/Pain Pain Assessment: No/denies pain     Hand Dominance Right   Extremity/Trunk Assessment Upper Extremity Assessment Upper Extremity Assessment: Overall WFL for tasks assessed   Lower Extremity Assessment Lower Extremity Assessment: Defer to PT evaluation   Cervical / Trunk  Assessment Cervical / Trunk Assessment: Normal   Communication Communication Communication: No difficulties   Cognition Arousal/Alertness: Awake/alert Behavior During Therapy: WFL for tasks assessed/performed Overall Cognitive Status: Within Functional Limits for tasks assessed                                                Home Living Family/patient expects to be discharged to:: Private residence Living Arrangements: Alone Available Help at Discharge: Family;Available PRN/intermittently Type of Home: House Home Access: Stairs to enter Entrance Stairs-Number of Steps: 2 Entrance Stairs-Rails: None Home Layout: Two level Alternate Level Stairs-Number of Steps: 15 to 20 Alternate Level Stairs-Rails: Left (going up) Bathroom Shower/Tub: Chief Strategy Officer: Handicapped height     Home Equipment: Other (comment);Grab bars - tub/shower (4 L O2 at home)          Prior Functioning/Environment Level of Independence: Needs assistance  Gait / Transfers Assistance Needed: ambulates without AD at baseline ADL's / Homemaking Assistance Needed: Independent for ADL's; assisted with groceries                     OT Goals(Current goals can be found in the care plan section) Acute Rehab OT Goals Patient Stated Goal: return home                   Co-evaluation PT/OT/SLP Co-Evaluation/Treatment: Yes Reason for Co-Treatment: To address functional/ADL transfers   OT goals addressed during session: ADL's and self-care;Strengthening/ROM      AM-PAC OT "6 Clicks" Daily Activity     Outcome Measure Help from another person eating meals?: None Help from another person taking care of personal grooming?: None Help from another person toileting, which includes using toliet, bedpan, or urinal?: None Help from another person bathing (including washing, rinsing, drying)?: None Help from another person to put on and taking off regular upper body  clothing?: None Help from another person to put on and taking off regular lower body clothing?: None 6 Click Score: 24   End of Session Equipment Utilized During Treatment: Oxygen (4L o2)  Activity Tolerance: Patient tolerated treatment well Patient left: in bed;with call bell/phone within reach  OT Visit Diagnosis: Unsteadiness on feet (R26.81);Other (comment) (shortness of breath)                Time: 0812-0822 OT Time Calculation (min): 10 min Charges:  OT General Charges $OT Visit: 1 Visit OT Evaluation $OT Eval Low Complexity: 1 Low  Anikin Prosser OT, MOT  Danie Chandler 10/22/2020, 9:37 AM

## 2020-10-22 NOTE — Evaluation (Signed)
Physical Therapy Evaluation Patient Details Name: Jacob Pace MRN: [id number] DOB: [birth year] Today's Date: 10/22/2020  History of Present Illness  Jacob Pace  is a 70 y.o. male, with history of COPD, emphysema, hypertension, who is 4 L nasal cannula at baseline presents ED with a chief complaint of shortness of breath.  Onset of shortness of breath was tonight prior to arrival.  Patient presented to the ED he had pursed lips and tripod breathing.  He was speaking in 4-5 word sentences.  He was not hypoxic, but he had increased work of breathing and impending sense of doom so started on the BiPAP.  He had significant air hunger and was screaming during 1 breathing treatment, so he was given Ativan to calm him down.  Since then his blood pressure has recovered nicely.  He has been tolerating the BiPAP.  At the time of my exam he opens his eyes, and answers yes to everything I ask, including " not really sure why you are here today right?"  To which he answered yes.  History is taken from ED staff and chart review.  Patient had admitted to cough with greenish-yellow sputum.  He tried inhalers at home without improvement.  He noted wheezing at home.  Patient noticed a chest tightness/epigastric pain.  He reports the pain is still there.  Again he tried breathing treatments without relief.  He was put on BiPAP at arrival.   Clinical Impression  Patient functioning near baseline for functional mobility and gait demonstrating good return for bed mobility and taking steps in room without loss of balance.  Patient limited mostly due to SOB and encouraged to pace himself and walk ad lib as tolerated in room and with nursing staff in hallway.  Plan:  Patient discharged from physical therapy to care of nursing for ambulation daily as tolerated for length of stay.         Recommendations for follow up therapy are one component of a multi-disciplinary discharge planning process, led by the attending physician.   Recommendations may be updated based on patient status, additional functional criteria and insurance authorization.  Follow Up Recommendations No PT follow up;Supervision - Intermittent    Equipment Recommendations  None recommended by PT    Recommendations for Other Services       Precautions / Restrictions Precautions Precautions: None Restrictions Weight Bearing Restrictions: No      Mobility  Bed Mobility Overal bed mobility: Modified Independent             General bed mobility comments: breaks needed    Transfers Overall transfer level: Modified independent Equipment used: None Transfers: Sit to/from [employer] Sit to Stand: Modified independent (Device/Increase time) Stand pivot transfers: Modified independent (Device/Increase time)       General transfer comment: Mod I due to need for rest breaks due to SOB.  Ambulation/Gait Ambulation/Gait assistance: Modified independent (Device/Increase time) Gait Distance (Feet): 15 Feet Assistive device: None Gait Pattern/deviations: WFL(Within Functional Limits) Gait velocity: decreased   General Gait Details: grossly WFL demonstrating good return for ambulaiton in room while on 4 LPM with SpO2 remaining above 90%, limited mostly due to SOB  Stairs            Wheelchair Mobility    Modified Rankin (Stroke Patients Only)       Balance Overall balance assessment: No apparent balance deficits (not formally assessed)  Pertinent Vitals/Pain Pain Assessment: No/denies pain    Home Living Family/patient expects to be discharged to:: Private residence Living Arrangements: Alone Available Help at Discharge: Family;Available PRN/intermittently Type of Home: House Home Access: Stairs to enter Entrance Stairs-Rails: None Entrance Stairs-Number of Steps: 2 Home Layout: Two level;Able to live on main level with  bedroom/bathroom;Full bath on main level;Other (Comment) (Patient states he does not go upstairs) Home Equipment: Grab bars - tub/shower      Prior Function Level of Independence: Needs assistance   Gait / Transfers Assistance Needed: household ambulator without AD, Home supplemental 4 LPM O2 dependent  ADL's / Homemaking Assistance Needed: Independent for ADL's; assisted with groceries        Hand Dominance   Dominant Hand: Right    Extremity/Trunk Assessment   Upper Extremity Assessment Upper Extremity Assessment: Defer to OT evaluation    Lower Extremity Assessment Lower Extremity Assessment: Overall WFL for tasks assessed    Cervical / Trunk Assessment Cervical / Trunk Assessment: Normal  Communication   Communication: No difficulties  Cognition Arousal/Alertness: Awake/alert Behavior During Therapy: WFL for tasks assessed/performed Overall Cognitive Status: Within Functional Limits for tasks assessed                                        General Comments      Exercises     Assessment/Plan    PT Assessment Patent does not need any further PT services  PT Problem List         PT Treatment Interventions      PT Goals (Current goals can be found in the Care Plan section)  Acute Rehab PT Goals Patient Stated Goal: return home with family to assist PT Goal Formulation: With patient Time For Goal Achievement: 10/22/20 Potential to Achieve Goals: Good    Frequency     Barriers to discharge        Co-evaluation PT/OT/SLP Co-Evaluation/Treatment: Yes Reason for Co-Treatment: To address functional/ADL transfers PT goals addressed during session: Mobility/safety with mobility;Balance OT goals addressed during session: ADL's and self-care;Strengthening/ROM       AM-PAC PT "6 Clicks" Mobility  Outcome Measure Help needed turning from your back to your side while in a flat bed without using bedrails?: None Help needed moving from  lying on your back to sitting on the side of a flat bed without using bedrails?: None Help needed moving to and from a bed to a chair (including a wheelchair)?: None Help needed standing up from a chair using your arms (e.g., wheelchair or bedside chair)?: None Help needed to walk in hospital room?: None Help needed climbing 3-5 steps with a railing? : A Little 6 Click Score: 23    End of Session Equipment Utilized During Treatment: Oxygen Activity Tolerance: Patient tolerated treatment well;Patient limited by fatigue Patient left: in bed;with call bell/phone within reach (left seated at bedside) Nurse Communication: Mobility status PT Visit Diagnosis: Unsteadiness on feet (R26.81);Other abnormalities of gait and mobility (R26.89);Muscle weakness (generalized) (M62.81)    Time: 9811-9147 PT Time Calculation (min) (ACUTE ONLY): 18 min   Charges:   PT Evaluation $PT Eval Low Complexity: 1 Low PT Treatments $Therapeutic Activity: 8-22 mins        10:36 AM, 10/22/20 Ocie Bob, MPT Physical Therapist with [facility] [phone] office [phone] mobile phone

## 2020-10-23 DIAGNOSIS — J9622 Acute and chronic respiratory failure with hypercapnia: Secondary | ICD-10-CM | POA: Diagnosis not present

## 2020-10-23 DIAGNOSIS — J441 Chronic obstructive pulmonary disease with (acute) exacerbation: Secondary | ICD-10-CM | POA: Diagnosis not present

## 2020-10-23 DIAGNOSIS — I10 Essential (primary) hypertension: Secondary | ICD-10-CM | POA: Diagnosis not present

## 2020-10-23 DIAGNOSIS — J439 Emphysema, unspecified: Secondary | ICD-10-CM | POA: Diagnosis not present

## 2020-10-23 LAB — BASIC METABOLIC PANEL
Anion gap: 7 (ref 5–15)
BUN: 15 mg/dL (ref 8–23)
CO2: 35 mmol/L — ABNORMAL HIGH (ref 22–32)
Calcium: 8.8 mg/dL — ABNORMAL LOW (ref 8.9–10.3)
Chloride: 97 mmol/L — ABNORMAL LOW (ref 98–111)
Creatinine, Ser: 0.65 mg/dL (ref 0.61–1.24)
GFR, Estimated: >60 mL/min (ref >60)
Glucose, Bld: 151 mg/dL — ABNORMAL HIGH (ref 70–99)
Potassium: 4 mmol/L (ref 3.5–5.1)
Sodium: 139 mmol/L (ref 135–145)

## 2020-10-23 LAB — CBC
HCT: 41.7 % (ref 39.0–52.0)
Hemoglobin: 12.7 g/dL — ABNORMAL LOW (ref 13.0–17.0)
MCH: 28.9 pg (ref 26.0–34.0)
MCHC: 30.5 g/dL (ref 30.0–36.0)
MCV: 94.8 fL (ref 80.0–100.0)
Platelets: 268 10*3/uL (ref 150–400)
RBC: 4.4 MIL/uL (ref 4.22–5.81)
RDW: 14 % (ref 11.5–15.5)
WBC: 13.2 10*3/uL — ABNORMAL HIGH (ref 4.0–10.5)
nRBC: 0 % (ref 0.0–0.2)

## 2020-10-23 LAB — MAGNESIUM: Magnesium: 2.3 mg/dL (ref 1.7–2.4)

## 2020-10-23 NOTE — Progress Notes (Signed)
Patient with shortness of breath when walking to the bathroom.

## 2020-10-23 NOTE — Progress Notes (Signed)
PROGRESS NOTE    Jacob Pace  YIR:[id number] DOB: 10/22/1950 DOA: 10/20/2020 PCP: [facility]   Brief Narrative:  70 year old with history of COPD, emphysema, HTN, chronic hypoxia on 4 L nasal cannula admitted for shortness of breath.  Initially had to be placed on BiPAP and given Ativan for anxiety.  VBG showed hypercapnic respiratory acidosis, chest x-ray showed moderate to marked severe emphysematous disease with bibasilar atelectasis.  Symptomatically feels slightly better but still has severe diminished breath sounds bilaterally.   Assessment & Plan:   Active Problems:   Acute on chronic respiratory failure with hypercapnia (HCC)   Essential (primary) hypertension   COPD exacerbation (HCC)  Acute hypoxic and hypercapnic respiratory failure requiring BiPAP Acute COPD exacerbation  Chronic hypoxia on 4 L nasal cannula - Now off BiPAP but severely diminished breath sounds bilaterally.  Bronchodilators scheduled and as needed.  Continue steroids.  I-S/flutter.  Adjust BiPAP settings as necessary.   -BNP and ProCal - neg.   Essential hypertension - Cardizem 180 milligrams daily  Epigastric pain -PPI    DVT prophylaxis: Hep SQ Code Status: Full  Family Communication:   Status is: Inpatient  Remains inpatient appropriate because:Inpatient level of care appropriate due to severity of illness.  Significant diminished breath sounds bilaterally.  Dispo: The patient is from: Home              Anticipated d/c is to: Home              Patient currently is not medically stable to d/c.   Difficult to place patient No        Subjective: Sitting at the side of bed tells me his breathing is slightly better.  He can still hear himself wheeze.  Has exertional dyspnea.  Review of Systems Otherwise negative except as per HPI, including: General: Denies fever, chills, night sweats or unintended weight loss. Resp: Denies hemoptysis Cardiac: Denies chest pain,  palpitations, orthopnea, paroxysmal nocturnal dyspnea. GI: Denies abdominal pain, nausea, vomiting, diarrhea or constipation GU: Denies dysuria, frequency, hesitancy or incontinence MS: Denies muscle aches, joint pain or swelling Neuro: Denies headache, neurologic deficits (focal weakness, numbness, tingling), abnormal gait Psych: Denies anxiety, depression, SI/HI/AVH Skin: Denies new rashes or lesions ID: Denies sick contacts, exotic exposures, travel Examination:  Constitutional: Not in acute distress Respiratory: Bilateral diffuse diminished breath sounds Cardiovascular: Normal sinus rhythm, no rubs Abdomen: Nontender nondistended good bowel sounds Musculoskeletal: No edema noted Skin: No rashes seen Neurologic: CN 2-12 grossly intact.  And nonfocal Psychiatric: Normal judgment and insight. Alert and oriented x 3. Normal mood.  Objective: Vitals:   10/23/20 0220 10/23/20 0457 10/23/20 0756 10/23/20 0900  BP:  139/89    Pulse:  84    Resp:  19    Temp:  97.7 F (36.5 C)    TempSrc:  Oral    SpO2: 98% 96% (!) 74% 97%  Weight:      Height:        Intake/Output Summary (Last 24 hours) at 10/23/2020 1232 Last data filed at 10/23/2020 0904 Gross per 24 hour  Intake 480 ml  Output 350 ml  Net 130 ml   Filed Weights   10/21/20 0001 10/21/20 1220  Weight: 99 kg 99 kg     Data Reviewed:   CBC: Recent Labs  Lab 10/20/20 2355 10/21/20 0439 10/22/20 0449 10/23/20 0527  WBC 9.8 12.9* 11.1* 13.2*  NEUTROABS 6.9  --   --   --  HGB 14.4 13.8 12.4* 12.7*  HCT 47.0 44.8 40.6 41.7  MCV 95.5 95.5 93.8 94.8  PLT 272 244 252 268   Basic Metabolic Panel: Recent Labs  Lab 10/20/20 2355 10/21/20 0439 10/22/20 0449 10/23/20 0527  NA 138 137 137 139  K 4.1 4.2 4.3 4.0  CL 98 100 98 97*  CO2 35* 32 33* 35*  GLUCOSE 105* 174* 148* 151*  BUN 10 13 12 15   CREATININE 0.81 0.86 0.66 0.65  CALCIUM 8.8* 8.5* 8.8* 8.8*  MG  --  2.1 2.3 2.3   GFR: Estimated Creatinine  Clearance: 106.5 mL/min (by C-G formula based on SCr of 0.65 mg/dL). Liver Function Tests: Recent Labs  Lab 10/21/20 0439  AST 19  ALT 17  ALKPHOS 75  BILITOT 0.5  PROT 7.1  ALBUMIN 3.5   No results for input(s): LIPASE, AMYLASE in the last 168 hours. No results for input(s): AMMONIA in the last 168 hours. Coagulation Profile: No results for input(s): INR, PROTIME in the last 168 hours. Cardiac Enzymes: No results for input(s): CKTOTAL, CKMB, CKMBINDEX, TROPONINI in the last 168 hours. BNP (last 3 results) No results for input(s): PROBNP in the last 8760 hours. HbA1C: No results for input(s): HGBA1C in the last 72 hours. CBG: No results for input(s): GLUCAP in the last 168 hours. Lipid Profile: No results for input(s): CHOL, HDL, LDLCALC, TRIG, CHOLHDL, LDLDIRECT in the last 72 hours. Thyroid Function Tests: No results for input(s): TSH, T4TOTAL, FREET4, T3FREE, THYROIDAB in the last 72 hours. Anemia Panel: No results for input(s): VITAMINB12, FOLATE, FERRITIN, TIBC, IRON, RETICCTPCT in the last 72 hours. Sepsis Labs: Recent Labs  Lab 10/21/20 0439  PROCALCITON <0.10    Recent Results (from the past 240 hour(s))  Resp Panel by RT-PCR (Flu A&B, Covid) Nasopharyngeal Swab     Status: None   Collection Time: 10/20/20 11:56 PM   Specimen: Nasopharyngeal Swab; Nasopharyngeal(NP) swabs in vial transport medium  Result Value Ref Range Status   SARS Coronavirus 2 by RT PCR NEGATIVE NEGATIVE Final    Comment: (NOTE) SARS-CoV-2 target nucleic acids are NOT DETECTED.  The SARS-CoV-2 RNA is generally detectable in upper respiratory specimens during the acute phase of infection. The lowest concentration of SARS-CoV-2 viral copies this assay can detect is 138 copies/mL. A negative result does not preclude SARS-Cov-2 infection and should not be used as the sole basis for treatment or other patient management decisions. A negative result may occur with  improper specimen  collection/handling, submission of specimen other than nasopharyngeal swab, presence of viral mutation(s) within the areas targeted by this assay, and inadequate number of viral copies(<138 copies/mL). A negative result must be combined with clinical observations, patient history, and epidemiological information. The expected result is Negative.  Fact Sheet for Patients:  10/22/20  Fact Sheet for Healthcare Providers:  BloggerCourse.com  This test is no t yet approved or cleared by the SeriousBroker.[place] FDA and  has been authorized for detection and/or diagnosis of SARS-CoV-2 by FDA under an Emergency Use Authorization (EUA). This EUA will remain  in effect (meaning this test can be used) for the duration of the COVID-19 declaration under Section 564(b)(1) of the Act, 21 U.S.C.section 360bbb-3(b)(1), unless the authorization is terminated  or revoked sooner.       Influenza A by PCR NEGATIVE NEGATIVE Final   Influenza B by PCR NEGATIVE NEGATIVE Final    Comment: (NOTE) The Xpert Xpress SARS-CoV-2/FLU/RSV plus assay is intended as an aid in the  diagnosis of influenza from Nasopharyngeal swab specimens and should not be used as a sole basis for treatment. Nasal washings and aspirates are unacceptable for Xpert Xpress SARS-CoV-2/FLU/RSV testing.  Fact Sheet for Patients: BloggerCourse.com  Fact Sheet for Healthcare Providers: SeriousBroker.it  This test is not yet approved or cleared by the [place] FDA and has been authorized for detection and/or diagnosis of SARS-CoV-2 by FDA under an Emergency Use Authorization (EUA). This EUA will remain in effect (meaning this test can be used) for the duration of the COVID-19 declaration under Section 564(b)(1) of the Act, 21 U.S.C. section 360bbb-3(b)(1), unless the authorization is terminated or revoked.  Performed at [facility], [address], [place], [place] [address]   MRSA Next Gen by PCR, Nasal     Status: None   Collection Time: 10/21/20 12:30 PM   Specimen: Nasal Mucosa; Nasal Swab  Result Value Ref Range Status   MRSA by PCR Next Gen NOT DETECTED NOT DETECTED Final    Comment: (NOTE) The GeneXpert MRSA Assay (FDA approved for NASAL specimens only), is one component of a comprehensive MRSA colonization surveillance program. It is not intended to diagnose MRSA infection nor to guide or monitor treatment for MRSA infections. Test performance is not FDA approved in patients less than 37 years old. Performed at [facility], [address], [place], [place] [address]          Radiology Studies: No results found.      Scheduled Meds:  Chlorhexidine Gluconate Cloth  6 each Topical Q0600   diltiazem  180 mg Oral Daily   folic acid  1 mg Oral Daily   heparin  5,000 Units Subcutaneous Q8H   ipratropium-albuterol  3 mL Nebulization Q6H   methylPREDNISolone (SOLU-MEDROL) injection  40 mg Intravenous Q8H   mometasone-formoterol  2 puff Inhalation BID   multivitamin with minerals  1 tablet Oral Daily   nicotine  21 mg Transdermal Daily   pantoprazole  40 mg Oral Daily   Continuous Infusions:   LOS: 2 days   Time spent= 35 mins    Fredna Stricker Joline Maxcy, MD [facility]  If 7PM-7AM, please contact night-coverage  10/23/2020, 12:32 PM

## 2020-10-23 NOTE — Progress Notes (Signed)
Patient coughing up some yellowish sputum post his last breathing treatment, lung sounds slightly worse with some expiratory wheezing. Sats okay on the 4.5lnc. Will continue to monitor patient. He says that the wheezing has been coming and going.

## 2020-10-23 NOTE — Progress Notes (Signed)
Chaplain engaged in an initial visit with Molly Maduro.  Chaplain engaged in a check-in to see how Abdirahim is doing after multiple hospital visits.  When Chaplain walked in he was sitting up on the side of his bed. Darcel is in good spirits and is looking forward to possibly going home on Saturday.   He also detailed that he has a lot of family and support.  Carr did not have any needs at this time.  Chaplain offered presence, support, and listening.    10/23/20 1100  Clinical Encounter Type  Visited With Patient  Visit Type Initial

## 2020-10-24 DIAGNOSIS — J9622 Acute and chronic respiratory failure with hypercapnia: Secondary | ICD-10-CM | POA: Diagnosis not present

## 2020-10-24 DIAGNOSIS — J441 Chronic obstructive pulmonary disease with (acute) exacerbation: Secondary | ICD-10-CM | POA: Diagnosis not present

## 2020-10-24 LAB — BASIC METABOLIC PANEL
Anion gap: 7 (ref 5–15)
BUN: 16 mg/dL (ref 8–23)
CO2: 34 mmol/L — ABNORMAL HIGH (ref 22–32)
Calcium: 8.7 mg/dL — ABNORMAL LOW (ref 8.9–10.3)
Chloride: 98 mmol/L (ref 98–111)
Creatinine, Ser: 0.64 mg/dL (ref 0.61–1.24)
GFR, Estimated: >60 mL/min (ref >60)
Glucose, Bld: 148 mg/dL — ABNORMAL HIGH (ref 70–99)
Potassium: 3.9 mmol/L (ref 3.5–5.1)
Sodium: 139 mmol/L (ref 135–145)

## 2020-10-24 LAB — MAGNESIUM: Magnesium: 2.4 mg/dL (ref 1.7–2.4)

## 2020-10-24 LAB — CBC
HCT: 42.1 % (ref 39.0–52.0)
Hemoglobin: 12.7 g/dL — ABNORMAL LOW (ref 13.0–17.0)
MCH: 28.6 pg (ref 26.0–34.0)
MCHC: 30.2 g/dL (ref 30.0–36.0)
MCV: 94.8 fL (ref 80.0–100.0)
Platelets: 252 10*3/uL (ref 150–400)
RBC: 4.44 MIL/uL (ref 4.22–5.81)
RDW: 13.9 % (ref 11.5–15.5)
WBC: 11.9 10*3/uL — ABNORMAL HIGH (ref 4.0–10.5)
nRBC: 0 % (ref 0.0–0.2)

## 2020-10-24 MED ORDER — BUDESONIDE 0.5 MG/2ML IN SUSP
0.5000 mg | Freq: Two times a day (BID) | RESPIRATORY_TRACT | Status: DC
  Administered 2020-10-24 – 2020-10-26 (×4): 0.5 mg via RESPIRATORY_TRACT
  Filled 2020-10-24 (×4): qty 2

## 2020-10-24 MED ORDER — ARFORMOTEROL TARTRATE 15 MCG/2ML IN NEBU
15.0000 ug | INHALATION_SOLUTION | Freq: Two times a day (BID) | RESPIRATORY_TRACT | Status: DC
  Administered 2020-10-24 – 2020-10-26 (×4): 15 ug via RESPIRATORY_TRACT
  Filled 2020-10-24 (×4): qty 2

## 2020-10-24 NOTE — Progress Notes (Signed)
PROGRESS NOTE  Jacob Pace VEH:[id number] DOB: 1950/02/23 DOA: 10/20/2020 [facility]CP: [facility]  Brief History:  70 y/o male with  history of COPD, SVT, chronic respiratory failure on 4 L, hypertension presenting with 1 week history of worsening cough and shortness of breath  Assessment/Plan: COPD exacerbation -continue IV solumedrol -initially on BiPAP -Started Pulmicort -Started Brovana -Continue duo nebs -personally reviewed CXR--no consolidation; chronic interstitial markings   Acute on Chronic respiratory failure with hypoxia and hypercarbia -initially on BiPAP -weaned to 4L -due to COPD exacerbation -He is chronically on 4 L nasal cannula at home -BNP 25 -PCT neg   Essential hypertension -Continue diltiazem CD 180 -well controlled   SVT -Continue diltiazem CD -Currently in sinus    Status is: Inpatient  Remains inpatient appropriate because:Inpatient level of care appropriate due to severity of illness  Dispo: The patient is from: Home              Anticipated d/c is to: Home              Patient currently is not medically stable to d/c.   Difficult to place patient No        Family Communication:  no Family at bedside  Consultants:  none  Code Status:  FULL  DVT Prophylaxis:  [place] Heparin    Procedures: As Listed in Progress Note Above  Antibiotics: None     Subjective: Patient is breathing better, but still has some dyspnea on exertion.  Denies f/c, cp, n/v/d, abd pain  Objective: Vitals:   10/24/20 0359 10/24/20 0514 10/24/20 0730 10/24/20 1331  BP:  118/70    Pulse:  85    Resp:  20    Temp:  98.1 F (36.7 C)    TempSrc:  Oral    SpO2: 96% 98% 97% 97%  Weight:      Height:        Intake/Output Summary (Last 24 hours) at 10/24/2020 1717 Last data filed at 10/23/2020 2252 Gross per 24 hour  Intake 240 ml  Output --  Net 240 ml   Weight change:  Exam:  General:  Pt is alert, follows commands  appropriately, not in acute distress HEENT: No icterus, No thrush, No neck mass, /AT Cardiovascular: RRR, S1/S2, no rubs, no gallops Respiratory: diminished BS.  Bibasilar crackles. No wheeze Abdomen: Soft/+BS, non tender, non distended, no guarding Extremities: 1 + LE edema, No lymphangitis, No petechiae, No rashes, no synovitis   Data Reviewed: I have personally reviewed following labs and imaging studies Basic Metabolic Panel: Recent Labs  Lab 10/20/20 2355 10/21/20 0439 10/22/20 0449 10/23/20 0527 10/24/20 0625  NA 138 137 137 139 139  K 4.1 4.2 4.3 4.0 3.9  CL 98 100 98 97* 98  CO2 35* 32 33* 35* 34*  GLUCOSE 105* 174* 148* 151* 148*  BUN 10 13 12 15 16   CREATININE 0.81 0.86 0.66 0.65 0.64  CALCIUM 8.8* 8.5* 8.8* 8.8* 8.7*  MG  --  2.1 2.3 2.3 2.4   Liver Function Tests: Recent Labs  Lab 10/21/20 0439  AST 19  ALT 17  ALKPHOS 75  BILITOT 0.5  PROT 7.1  ALBUMIN 3.5   No results for input(s): LIPASE, AMYLASE in the last 168 hours. No results for input(s): AMMONIA in the last 168 hours. Coagulation Profile: No results for input(s): INR, PROTIME in the last 168 hours. CBC: Recent Labs  Lab 10/20/20  2355 10/21/20 0439 10/22/20 0449 10/23/20 0527 10/24/20 0625  WBC 9.8 12.9* 11.1* 13.2* 11.9*  NEUTROABS 6.9  --   --   --   --   HGB 14.4 13.8 12.4* 12.7* 12.7*  HCT 47.0 44.8 40.6 41.7 42.1  MCV 95.5 95.5 93.8 94.8 94.8  PLT 272 244 252 268 252   Cardiac Enzymes: No results for input(s): CKTOTAL, CKMB, CKMBINDEX, TROPONINI in the last 168 hours. BNP: Invalid input(s): POCBNP CBG: No results for input(s): GLUCAP in the last 168 hours. HbA1C: No results for input(s): HGBA1C in the last 72 hours. Urine analysis: No results found for: COLORURINE, APPEARANCEUR, LABSPEC, PHURINE, GLUCOSEU, HGBUR, BILIRUBINUR, KETONESUR, PROTEINUR, UROBILINOGEN, NITRITE, LEUKOCYTESUR Sepsis Labs: @LABRCNTIP (procalcitonin:4,lacticidven:4) ) Recent Results (from the past  240 hour(s))  Resp Panel by RT-PCR (Flu A&B, Covid) Nasopharyngeal Swab     Status: None   Collection Time: 10/20/20 11:56 PM   Specimen: Nasopharyngeal Swab; Nasopharyngeal(NP) swabs in vial transport medium  Result Value Ref Range Status   SARS Coronavirus 2 by RT PCR NEGATIVE NEGATIVE Final    Comment: (NOTE) SARS-CoV-2 target nucleic acids are NOT DETECTED.  The SARS-CoV-2 RNA is generally detectable in upper respiratory specimens during the acute phase of infection. The lowest concentration of SARS-CoV-2 viral copies this assay can detect is 138 copies/mL. A negative result does not preclude SARS-Cov-2 infection and should not be used as the sole basis for treatment or other patient management decisions. A negative result may occur with  improper specimen collection/handling, submission of specimen other than nasopharyngeal swab, presence of viral mutation(s) within the areas targeted by this assay, and inadequate number of viral copies(<138 copies/mL). A negative result must be combined with clinical observations, patient history, and epidemiological information. The expected result is Negative.  Fact Sheet for Patients:  10/22/20  Fact Sheet for Healthcare Providers:  BloggerCourse.com  This test is no t yet approved or cleared by the [employer]iousBroker.[place] FDA and  has been authorized for detection and/or diagnosis of SARS-CoV-2 by FDA under an Emergency Use Authorization (EUA). This EUA will remain  in effect (meaning this test can be used) for the duration of the COVID-19 declaration under Section 564(b)(1) of the Act, 21 U.S.C.section 360bbb-3(b)(1), unless the authorization is terminated  or revoked sooner.       Influenza A by PCR NEGATIVE NEGATIVE Final   Influenza B by PCR NEGATIVE NEGATIVE Final    Comment: (NOTE) The Xpert Xpress SARS-CoV-2/FLU/RSV plus assay is intended as an aid in the diagnosis of influenza  from Nasopharyngeal swab specimens and should not be used as a sole basis for treatment. Nasal washings and aspirates are unacceptable for Xpert Xpress SARS-CoV-2/FLU/RSV testing.  Fact Sheet for Patients: [place]  Fact Sheet for Healthcare Providers: BloggerCourse.com  This test is not yet approved or cleared by the SeriousBroker.it FDA and has been authorized for detection and/or diagnosis of SARS-CoV-2 by FDA under an Emergency Use Authorization (EUA). This EUA will remain in effect (meaning this test can be used) for the duration of the COVID-19 declaration under Section 564(b)(1) of the Act, 21 U.S.C. section 360bbb-3(b)(1), unless the authorization is terminated or revoked.  Performed at [facility], [address] [place], [place]   MRSA Next Gen by PCR, Nasal     Status: None   Collection Time: 10/21/20 12:30 PM   Specimen: Nasal Mucosa; Nasal Swab  Result Value Ref Range Status   MRSA by PCR Next Gen NOT DETECTED NOT DETECTED Final  Comment: (NOTE) The GeneXpert MRSA Assay (FDA approved for NASAL specimens only), is one component of a comprehensive MRSA colonization surveillance program. It is not intended to diagnose MRSA infection nor to guide or monitor treatment for MRSA infections. Test performance is not FDA approved in patients less than 44 years old. Performed at [facility], [address], [place], [place] [address]      Scheduled Meds:  Chlorhexidine Gluconate Cloth  6 each Topical Q0600   diltiazem  180 mg Oral Daily   folic acid  1 mg Oral Daily   heparin  5,000 Units Subcutaneous Q8H   ipratropium-albuterol  3 mL Nebulization Q6H   methylPREDNISolone (SOLU-MEDROL) injection  40 mg Intravenous Q8H   mometasone-formoterol  2 puff Inhalation BID   multivitamin with minerals  1 tablet Oral Daily   nicotine  21 mg Transdermal Daily   pantoprazole  40 mg Oral Daily   Continuous  Infusions:  Procedures/Studies: DG Chest Port 1 View  Result Date: 10/21/2020 CLINICAL DATA:  Shortness of breath. EXAM: PORTABLE CHEST 1 VIEW COMPARISON:  September 24, 2020 FINDINGS: The lungs are hyperinflated. Moderate to marked severity emphysematous lung disease is noted. Very mild areas of atelectasis are seen within the bilateral lung bases. Stable biapical pleural thickening is noted. There is no evidence of a pleural effusion or pneumothorax. The heart size and mediastinal contours are within normal limits. The visualized skeletal structures are unremarkable. IMPRESSION: 1. Moderate to marked severity emphysematous lung disease. 2. Very mild bibasilar atelectasis. Electronically Signed   By: Aram Candela M.D.   On: 10/21/2020 00:15    Catarina Hartshorn, DO  [facility]  If 7PM-7AM, please contact night-coverage [email or web address] Password [facility] 10/24/2020, 5:17 PM   LOS: 3 days

## 2020-10-24 NOTE — Plan of Care (Signed)

## 2020-10-24 NOTE — Clinical Social Work Note (Addendum)
[place] notification done [phone]

## 2020-10-25 DIAGNOSIS — J441 Chronic obstructive pulmonary disease with (acute) exacerbation: Secondary | ICD-10-CM | POA: Diagnosis not present

## 2020-10-25 DIAGNOSIS — I10 Essential (primary) hypertension: Secondary | ICD-10-CM | POA: Diagnosis not present

## 2020-10-25 LAB — BASIC METABOLIC PANEL
Anion gap: 5 (ref 5–15)
BUN: 15 mg/dL (ref 8–23)
CO2: 36 mmol/L — ABNORMAL HIGH (ref 22–32)
Calcium: 8.7 mg/dL — ABNORMAL LOW (ref 8.9–10.3)
Chloride: 98 mmol/L (ref 98–111)
Creatinine, Ser: 0.68 mg/dL (ref 0.61–1.24)
GFR, Estimated: >60 mL/min (ref >60)
Glucose, Bld: 148 mg/dL — ABNORMAL HIGH (ref 70–99)
Potassium: 3.8 mmol/L (ref 3.5–5.1)
Sodium: 139 mmol/L (ref 135–145)

## 2020-10-25 LAB — CBC
HCT: 40.4 % (ref 39.0–52.0)
Hemoglobin: 12.5 g/dL — ABNORMAL LOW (ref 13.0–17.0)
MCH: 29.4 pg (ref 26.0–34.0)
MCHC: 30.9 g/dL (ref 30.0–36.0)
MCV: 95.1 fL (ref 80.0–100.0)
Platelets: 263 10*3/uL (ref 150–400)
RBC: 4.25 MIL/uL (ref 4.22–5.81)
RDW: 13.9 % (ref 11.5–15.5)
WBC: 11.5 10*3/uL — ABNORMAL HIGH (ref 4.0–10.5)
nRBC: 0 % (ref 0.0–0.2)

## 2020-10-25 LAB — MAGNESIUM: Magnesium: 2.3 mg/dL (ref 1.7–2.4)

## 2020-10-25 MED ORDER — IPRATROPIUM-ALBUTEROL 0.5-2.5 (3) MG/3ML IN SOLN
3.0000 mL | Freq: Four times a day (QID) | RESPIRATORY_TRACT | Status: DC
Start: 2020-10-25 — End: 2020-10-26
  Administered 2020-10-25 – 2020-10-26 (×4): 3 mL via RESPIRATORY_TRACT
  Filled 2020-10-25 (×4): qty 3

## 2020-10-25 NOTE — Progress Notes (Signed)
Received report from Maceo, Charity fundraiser. Assumed pt care at 0700. Pt resting comfortably in bed in no acute distress. VSS. Pt SOB with exertion. O2 @ 4L/[place], chronic. Denies pain. Call bell within reach. Will continue to monitor.

## 2020-10-25 NOTE — Progress Notes (Signed)
PROGRESS NOTE  Jacob Pace DDU:[id number] DOB: [birth year] DOA: 10/20/2020 [facility]CP: [facility]  Brief History:  70 y/o male with  history of COPD, SVT, chronic respiratory failure on 4 L, hypertension presenting with 1 week history of worsening cough and shortness of breath.  He was placed on BiPAP initially.  He was started on BDs and IV steroids with slow clinical improvement.  He was weaned off BiPAP and back to his home oxygen demand.   Assessment/Plan: COPD exacerbation -continue IV solumedrol -initially on BiPAP -Continue Pulmicort -Continue Brovana -Continue duo nebs -personally reviewed CXR--no consolidation; chronic interstitial markings   Acute on Chronic respiratory failure with hypoxia and hypercarbia -initially on BiPAP -weaned to 4L -due to COPD exacerbation -He is chronically on 4 L nasal cannula at home -BNP 25 -PCT neg   Essential hypertension -Continue diltiazem CD 180 -well controlled   SVT -Continue diltiazem CD -Currently in sinus       Status is: Inpatient   Remains inpatient appropriate because:Inpatient level of care appropriate due to severity of illness   Dispo: The patient is from: Home              Anticipated d/c is to: Home              Patient currently is not medically stable to d/c.              Difficult to place patient No               Family Communication:  no Family at bedside   Consultants:  none   Code Status:  FULL   DVT Prophylaxis:  [place] Heparin      Procedures: As Listed in Progress Note Above   Antibiotics: None        Subjective: Patient continues to breath a little better each day, but has some dyspnea on exertion.  Denies f/c, cp, n/v/d, abd pain  Objective: Vitals:   10/24/20 2112 10/24/20 2310 10/25/20 0523 10/25/20 0723  BP: 132/85  124/69   Pulse: 99  81   Resp: 20  20   Temp: 98.6 F (37 C)  98 F (36.7 C)   TempSrc: Oral  Oral   SpO2: 97% 95% 98% 95%  Weight:       Height:        Intake/Output Summary (Last 24 hours) at 10/25/2020 1100 Last data filed at 10/24/2020 2130 Gross per 24 hour  Intake 720 ml  Output --  Net 720 ml   Weight change:  Exam:  General:  Pt is alert, follows commands appropriately, not in acute distress HEENT: No icterus, No thrush, No neck mass, [place]/AT Cardiovascular: RRR, S1/S2, no rubs, no gallops Respiratory: diminished BS.  Bibasilar crackles.  Abdomen: Soft/+BS, non tender, non distended, no guarding Extremities: trace LE edema, No lymphangitis, No petechiae, No rashes, no synovitis   Data Reviewed: I have personally reviewed following labs and imaging studies Basic Metabolic Panel: Recent Labs  Lab 10/21/20 0439 10/22/20 0449 10/23/20 0527 10/24/20 0625 10/25/20 0515  NA 137 137 139 139 139  K 4.2 4.3 4.0 3.9 3.8  CL 100 98 97* 98 98  CO2 32 33* 35* 34* 36*  GLUCOSE 174* 148* 151* 148* 148*  BUN 13 12 15 16 15   CREATININE 0.86 0.66 0.65 0.64 0.68  CALCIUM 8.5* 8.8* 8.8* 8.7* 8.7*  MG 2.1 2.3 2.3 2.4 2.3  Liver Function Tests: Recent Labs  Lab 10/21/20 0439  AST 19  ALT 17  ALKPHOS 75  BILITOT 0.5  PROT 7.1  ALBUMIN 3.5   No results for input(s): LIPASE, AMYLASE in the last 168 hours. No results for input(s): AMMONIA in the last 168 hours. Coagulation Profile: No results for input(s): INR, PROTIME in the last 168 hours. CBC: Recent Labs  Lab 10/20/20 2355 10/21/20 0439 10/22/20 0449 10/23/20 0527 10/24/20 0625 10/25/20 0515  WBC 9.8 12.9* 11.1* 13.2* 11.9* 11.5*  NEUTROABS 6.9  --   --   --   --   --   HGB 14.4 13.8 12.4* 12.7* 12.7* 12.5*  HCT 47.0 44.8 40.6 41.7 42.1 40.4  MCV 95.5 95.5 93.8 94.8 94.8 95.1  PLT 272 244 252 268 252 263   Cardiac Enzymes: No results for input(s): CKTOTAL, CKMB, CKMBINDEX, TROPONINI in the last 168 hours. BNP: Invalid input(s): POCBNP CBG: No results for input(s): GLUCAP in the last 168 hours. HbA1C: No results for input(s): HGBA1C in  the last 72 hours. Urine analysis: No results found for: COLORURINE, APPEARANCEUR, LABSPEC, PHURINE, GLUCOSEU, HGBUR, BILIRUBINUR, KETONESUR, PROTEINUR, UROBILINOGEN, NITRITE, LEUKOCYTESUR Sepsis Labs: @LABRCNTIP (procalcitonin:4,lacticidven:4) ) Recent Results (from the past 240 hour(s))  Resp Panel by RT-PCR (Flu A&B, Covid) Nasopharyngeal Swab     Status: None   Collection Time: 10/20/20 11:56 PM   Specimen: Nasopharyngeal Swab; Nasopharyngeal(NP) swabs in vial transport medium  Result Value Ref Range Status   SARS Coronavirus 2 by RT PCR NEGATIVE NEGATIVE Final    Comment: (NOTE) SARS-CoV-2 target nucleic acids are NOT DETECTED.  The SARS-CoV-2 RNA is generally detectable in upper respiratory specimens during the acute phase of infection. The lowest concentration of SARS-CoV-2 viral copies this assay can detect is 138 copies/mL. A negative result does not preclude SARS-Cov-2 infection and should not be used as the sole basis for treatment or other patient management decisions. A negative result may occur with  improper specimen collection/handling, submission of specimen other than nasopharyngeal swab, presence of viral mutation(s) within the areas targeted by this assay, and inadequate number of viral copies(<138 copies/mL). A negative result must be combined with clinical observations, patient history, and epidemiological information. The expected result is Negative.  Fact Sheet for Patients:  10/22/20  Fact Sheet for Healthcare Providers:  BloggerCourse.com  This test is no t yet approved or cleared by the [employer]iousBro[place] FDA and  has been authorized for detection and/or diagnosis of SARS-CoV-2 by FDA under an Emergency Use Authorization (EUA). This EUA will remain  in effect (meaning this test can be used) for the duration of the COVID-19 declaration under Section 564(b)(1) of the Act, 21 U.S.C.section  360bbb-3(b)(1), unless the authorization is terminated  or revoked sooner.       Influenza A by PCR NEGATIVE NEGATIVE Final   Influenza B by PCR NEGATIVE NEGATIVE Final    Comment: (NOTE) The Xpert Xpress SARS-CoV-2/FLU/RSV plus assay is intended as an aid in the diagnosis of influenza from Nasopharyngeal swab specimens and should not be used as a sole basis for treatment. Nasal washings and aspirates are unacceptable for Xpert Xpress SARS-CoV-2/FLU/RSV testing.  Fact Sheet for Patients: [place]  Fact Sheet for Healthcare Providers: BloggerCourse.com  This test is not yet approved or cleared by the SeriousBroker.[place] FDA and has been authorized for detection and/or diagnosis of SARS-CoV-2 by FDA under an Emergency Use Authorization (EUA). This EUA will remain in effect (meaning this test can be used) for the duration  of the COVID-19 declaration under Section 564(b)(1) of the Act, 21 U.S.C. section 360bbb-3(b)(1), unless the authorization is terminated or revoked.  Performed at [facility], [address] [place], [place] [address]   MRSA Next Gen by PCR, Nasal     Status: None   Collection Time: 10/21/20 12:30 PM   Specimen: Nasal Mucosa; Nasal Swab  Result Value Ref Range Status   MRSA by PCR Next Gen NOT DETECTED NOT DETECTED Final    Comment: (NOTE) The GeneXpert MRSA Assay (FDA approved for NASAL specimens only), is one component of a comprehensive MRSA colonization surveillance program. It is not intended to diagnose MRSA infection nor to guide or monitor treatment for MRSA infections. Test performance is not FDA approved in patients less than 57 years old. Performed at [facility], [address], [place], [place] [address]      Scheduled Meds:  arformoterol  15 mcg Nebulization BID   budesonide (PULMICORT) nebulizer solution  0.5 mg Nebulization BID   Chlorhexidine Gluconate Cloth  6 each Topical Q0600    diltiazem  180 mg Oral Daily   folic acid  1 mg Oral Daily   heparin  5,000 Units Subcutaneous Q8H   ipratropium-albuterol  3 mL Nebulization Q6H WA   methylPREDNISolone (SOLU-MEDROL) injection  40 mg Intravenous Q8H   multivitamin with minerals  1 tablet Oral Daily   nicotine  21 mg Transdermal Daily   pantoprazole  40 mg Oral Daily   Continuous Infusions:  Procedures/Studies: DG Chest Port 1 View  Result Date: 10/21/2020 CLINICAL DATA:  Shortness of breath. EXAM: PORTABLE CHEST 1 VIEW COMPARISON:  September 24, 2020 FINDINGS: The lungs are hyperinflated. Moderate to marked severity emphysematous lung disease is noted. Very mild areas of atelectasis are seen within the bilateral lung bases. Stable biapical pleural thickening is noted. There is no evidence of a pleural effusion or pneumothorax. The heart size and mediastinal contours are within normal limits. The visualized skeletal structures are unremarkable. IMPRESSION: 1. Moderate to marked severity emphysematous lung disease. 2. Very mild bibasilar atelectasis. Electronically Signed   By: Aram Candela M.D.   On: 10/21/2020 00:15    Catarina Hartshorn, DO  [facility]  If 7PM-7AM, please contact night-coverage [email or web address] Password [email or web address] 10/25/2020, 11:00 AM   LOS: 4 days

## 2020-10-26 DIAGNOSIS — I10 Essential (primary) hypertension: Secondary | ICD-10-CM | POA: Diagnosis not present

## 2020-10-26 DIAGNOSIS — J9622 Acute and chronic respiratory failure with hypercapnia: Secondary | ICD-10-CM | POA: Diagnosis not present

## 2020-10-26 DIAGNOSIS — J441 Chronic obstructive pulmonary disease with (acute) exacerbation: Secondary | ICD-10-CM | POA: Diagnosis not present

## 2020-10-26 LAB — CBC
HCT: 41 % (ref 39.0–52.0)
Hemoglobin: 12.7 g/dL — ABNORMAL LOW (ref 13.0–17.0)
MCH: 29.1 pg (ref 26.0–34.0)
MCHC: 31 g/dL (ref 30.0–36.0)
MCV: 94 fL (ref 80.0–100.0)
Platelets: 262 10*3/uL (ref 150–400)
RBC: 4.36 MIL/uL (ref 4.22–5.81)
RDW: 13.6 % (ref 11.5–15.5)
WBC: 13.7 10*3/uL — ABNORMAL HIGH (ref 4.0–10.5)
nRBC: 0 % (ref 0.0–0.2)

## 2020-10-26 LAB — BASIC METABOLIC PANEL
Anion gap: 6 (ref 5–15)
BUN: 17 mg/dL (ref 8–23)
CO2: 37 mmol/L — ABNORMAL HIGH (ref 22–32)
Calcium: 8.6 mg/dL — ABNORMAL LOW (ref 8.9–10.3)
Chloride: 95 mmol/L — ABNORMAL LOW (ref 98–111)
Creatinine, Ser: 0.69 mg/dL (ref 0.61–1.24)
GFR, Estimated: >60 mL/min (ref >60)
Glucose, Bld: 149 mg/dL — ABNORMAL HIGH (ref 70–99)
Potassium: 3.8 mmol/L (ref 3.5–5.1)
Sodium: 138 mmol/L (ref 135–145)

## 2020-10-26 LAB — MAGNESIUM: Magnesium: 2.3 mg/dL (ref 1.7–2.4)

## 2020-10-26 MED ORDER — PREDNISONE 20 MG PO TABS: 60.0000 mg | ORAL_TABLET | Freq: Every day | ORAL | Status: DC

## 2020-10-26 MED ORDER — PREDNISONE 10 MG PO TABS: 60.0000 mg | ORAL_TABLET | Freq: Every day | ORAL | 0 refills | Status: DC

## 2020-10-26 NOTE — Progress Notes (Signed)
Received report from [place], Charity fundraiser. Assumed pt care at 0700. Pt resting comfortably in bed in no acute distress. A&O. SOB with exertion. O2 @ 4L/[place] chronic. Tolerating diet. VSS. Call bell within reach. Will continue to monitor.

## 2020-10-26 NOTE — Progress Notes (Signed)
Pt discharged in stable condition via wheelchair into the care of a friend via private vehicle. Pt was connected to his personal oxygen tank for O2 @ 4L/[place], chronic. Discharge instructions were reviewed with pt. Pt verbalized understanding. Pt stated that he does have a nebulizer machine at home for use of prescribed nebulizer treatments. Personal belongings were sent with pt. No home meds were brought to the hospital for return. PIV removed intact, w/o S&S of complications.

## 2020-10-26 NOTE — Discharge Summary (Signed)
Physician Discharge Summary  Jacob Pace [name]:[id number] DOB: 1950/09/06 DOA: 10/20/2020  PCP: [facility]  Admit date: 10/20/2020 Discharge date: 10/26/2020  Admitted From: Home Disposition:  Home   Recommendations for Outpatient Follow-up:  Follow up with PCP in 1-2 weeks Please obtain BMP/CBC in one week    Equipment/Devices:4L [place]  Discharge Condition: Stable CODE STATUS:FULL Diet recommendation: Heart Healthy    Brief/Interim Summary: 70 y/o male with  history of COPD, SVT, chronic respiratory failure on 4 L, hypertension presenting with 1 week history of worsening cough and shortness of breath.  He was placed on BiPAP initially.  He was started on BDs and IV steroids with slow clinical improvement.  He was weaned off BiPAP and back to his home oxygen demand.  Discharge Diagnoses:  COPD exacerbation -continue IV solumedrol>>d/c home with prednisone taper -initially on BiPAP -Continue Pulmicort during hospitalization -Continue Brovana during hospitalization -Continue duo nebs -personally reviewed CXR--no consolidation; chronic interstitial markings   Acute on Chronic respiratory failure with hypoxia and hypercarbia -initially on BiPAP -weaned to 4L -due to COPD exacerbation -He is chronically on 4 L nasal cannula at home -BNP 25 -PCT neg   Essential hypertension -Continue diltiazem CD 180 -well controlled   SVT -Continue diltiazem CD -Currently in sinus -no paroxsyms of SVT during hospitalization     Discharge Instructions   Allergies as of 10/26/2020   No Known Allergies      Medication List     STOP taking these medications    Breztri Aerosphere 160-9-4.8 MCG/ACT Aero Generic drug: Budeson-Glycopyrrol-Formoterol   hydrOXYzine 25 MG tablet Commonly known as: ATARAX/VISTARIL   multivitamin with minerals Tabs tablet   pantoprazole 40 MG tablet Commonly known as: Protonix   rosuvastatin 20 MG tablet Commonly known as:  CRESTOR       TAKE these medications    albuterol (2.5 MG/3ML) 0.083% nebulizer solution Commonly known as: PROVENTIL Take 6 mLs (5 mg total) by nebulization every 4 (four) hours as needed for wheezing or shortness of breath. What changed: Another medication with the same name was removed. Continue taking this medication, and follow the directions you see here.   budesonide-formoterol 160-4.5 MCG/ACT inhaler Commonly known as: SYMBICORT Inhale 2 puffs into the lungs 2 (two) times daily.   diltiazem 180 MG 24 hr capsule Commonly known as: CARDIZEM CD Take 1 capsule (180 mg total) by mouth daily.   folic acid 1 MG tablet Commonly known as: FOLVITE Take 1 tablet (1 mg total) by mouth daily.   predniSONE 10 MG tablet Commonly known as: DELTASONE Take 6 tablets (60 mg total) by mouth daily with breakfast. And decrease by one tablet daily Start taking on: October 27, 2020   tiotropium 18 MCG inhalation capsule Commonly known as: SPIRIVA Place 1 capsule (18 mcg total) into inhaler and inhale daily.        No Known Allergies  Consultations: none   Procedures/Studies: DG Chest Port 1 View  Result Date: 10/21/2020 CLINICAL DATA:  Shortness of breath. EXAM: PORTABLE CHEST 1 VIEW COMPARISON:  September 24, 2020 FINDINGS: The lungs are hyperinflated. Moderate to marked severity emphysematous lung disease is noted. Very mild areas of atelectasis are seen within the bilateral lung bases. Stable biapical pleural thickening is noted. There is no evidence of a pleural effusion or pneumothorax. The heart size and mediastinal contours are within normal limits. The visualized skeletal structures are unremarkable. IMPRESSION: 1. Moderate to marked severity emphysematous lung disease. 2. Very mild bibasilar  atelectasis. Electronically Signed   By: Aram Candela M.D.   On: 10/21/2020 00:15        Discharge Exam: Vitals:   10/26/20 0559 10/26/20 0812  BP: 128/82 (!) 146/80  Pulse:  86 73  Resp: 17 19  Temp: 97.7 F (36.5 C) 97.9 F (36.6 C)  SpO2: 92% 96%   Vitals:   10/25/20 1945 10/25/20 2107 10/26/20 0559 10/26/20 0812  BP:  (!) 156/92 128/82 (!) 146/80  Pulse: 88 98 86 73  Resp: 20 20 17 19   Temp:  98.2 F (36.8 C) 97.7 F (36.5 C) 97.9 F (36.6 C)  TempSrc:  Oral Oral Oral  SpO2: 97% 96% 92% 96%  Weight:      Height:        General: Pt is alert, awake, not in acute distress Cardiovascular: RRR, S1/S2 +, no rubs, no gallops Respiratory: diminished BS.  Bibasilar rales. No wheeze Abdominal: Soft, NT, ND, bowel sounds + Extremities: no edema, no cyanosis   The results of significant diagnostics from this hospitalization (including imaging, microbiology, ancillary and laboratory) are listed below for reference.    Significant Diagnostic Studies: DG Chest Port 1 View  Result Date: 10/21/2020 CLINICAL DATA:  Shortness of breath. EXAM: PORTABLE CHEST 1 VIEW COMPARISON:  September 24, 2020 FINDINGS: The lungs are hyperinflated. Moderate to marked severity emphysematous lung disease is noted. Very mild areas of atelectasis are seen within the bilateral lung bases. Stable biapical pleural thickening is noted. There is no evidence of a pleural effusion or pneumothorax. The heart size and mediastinal contours are within normal limits. The visualized skeletal structures are unremarkable. IMPRESSION: 1. Moderate to marked severity emphysematous lung disease. 2. Very mild bibasilar atelectasis. Electronically Signed   By: September 26, 2020 M.D.   On: 10/21/2020 00:15    Microbiology: Recent Results (from the past 240 hour(s))  Resp Panel by RT-PCR (Flu A&B, Covid) Nasopharyngeal Swab     Status: None   Collection Time: 10/20/20 11:56 PM   Specimen: Nasopharyngeal Swab; Nasopharyngeal(NP) swabs in vial transport medium  Result Value Ref Range Status   SARS Coronavirus 2 by RT PCR NEGATIVE NEGATIVE Final    Comment: (NOTE) SARS-CoV-2 target nucleic acids are NOT  DETECTED.  The SARS-CoV-2 RNA is generally detectable in upper respiratory specimens during the acute phase of infection. The lowest concentration of SARS-CoV-2 viral copies this assay can detect is 138 copies/mL. A negative result does not preclude SARS-Cov-2 infection and should not be used as the sole basis for treatment or other patient management decisions. A negative result may occur with  improper specimen collection/handling, submission of specimen other than nasopharyngeal swab, presence of viral mutation(s) within the areas targeted by this assay, and inadequate number of viral copies(<138 copies/mL). A negative result must be combined with clinical observations, patient history, and epidemiological information. The expected result is Negative.  Fact Sheet for Patients:  10/22/20  Fact Sheet for Healthcare Providers:  BloggerCourse.com  This test is no t yet approved or cleared by the [employer].[place] FDA and  has been authorized for detection and/or diagnosis of SARS-CoV-2 by FDA under an Emergency Use Authorization (EUA). This EUA will remain  in effect (meaning this test can be used) for the duration of the COVID-19 declaration under Section 564(b)(1) of the Act, 21 U.S.C.section 360bbb-3(b)(1), unless the authorization is terminated  or revoked sooner.       Influenza A by PCR NEGATIVE NEGATIVE Final   Influenza B by PCR NEGATIVE  NEGATIVE Final    Comment: (NOTE) The Xpert Xpress SARS-CoV-2/FLU/RSV plus assay is intended as an aid in the diagnosis of influenza from Nasopharyngeal swab specimens and should not be used as a sole basis for treatment. Nasal washings and aspirates are unacceptable for Xpert Xpress SARS-CoV-2/FLU/RSV testing.  Fact Sheet for Patients: BloggerCourse.com  Fact Sheet for Healthcare Providers: SeriousBroker.it  This test is not yet  approved or cleared by the [place] FDA and has been authorized for detection and/or diagnosis of SARS-CoV-2 by FDA under an Emergency Use Authorization (EUA). This EUA will remain in effect (meaning this test can be used) for the duration of the COVID-19 declaration under Section 564(b)(1) of the Act, 21 U.S.C. section 360bbb-3(b)(1), unless the authorization is terminated or revoked.  Performed at [facility], [address] [place], [place] [address]   MRSA Next Gen by PCR, Nasal     Status: None   Collection Time: 10/21/20 12:30 PM   Specimen: Nasal Mucosa; Nasal Swab  Result Value Ref Range Status   MRSA by PCR Next Gen NOT DETECTED NOT DETECTED Final    Comment: (NOTE) The GeneXpert MRSA Assay (FDA approved for NASAL specimens only), is one component of a comprehensive MRSA colonization surveillance program. It is not intended to diagnose MRSA infection nor to guide or monitor treatment for MRSA infections. Test performance is not FDA approved in patients less than 56 years old. Performed at [facility], [address], [place], [place] [address]      Labs: Basic Metabolic Panel: Recent Labs  Lab 10/22/20 0449 10/23/20 0527 10/24/20 0625 10/25/20 0515 10/26/20 0530  NA 137 139 139 139 138  K 4.3 4.0 3.9 3.8 3.8  CL 98 97* 98 98 95*  CO2 33* 35* 34* 36* 37*  GLUCOSE 148* 151* 148* 148* 149*  BUN 12 15 16 15 17   CREATININE 0.66 0.65 0.64 0.68 0.69  CALCIUM 8.8* 8.8* 8.7* 8.7* 8.6*  MG 2.3 2.3 2.4 2.3 2.3   Liver Function Tests: Recent Labs  Lab 10/21/20 0439  AST 19  ALT 17  ALKPHOS 75  BILITOT 0.5  PROT 7.1  ALBUMIN 3.5   No results for input(s): LIPASE, AMYLASE in the last 168 hours. No results for input(s): AMMONIA in the last 168 hours. CBC: Recent Labs  Lab 10/20/20 2355 10/21/20 0439 10/22/20 0449 10/23/20 0527 10/24/20 0625 10/25/20 0515 10/26/20 0530  WBC 9.8   < > 11.1* 13.2* 11.9* 11.5* 13.7*  NEUTROABS 6.9  --   --   --   --    --   --   HGB 14.4   < > 12.4* 12.7* 12.7* 12.5* 12.7*  HCT 47.0   < > 40.6 41.7 42.1 40.4 41.0  MCV 95.5   < > 93.8 94.8 94.8 95.1 94.0  PLT 272   < > 252 268 252 263 262   < > = values in this interval not displayed.   Cardiac Enzymes: No results for input(s): CKTOTAL, CKMB, CKMBINDEX, TROPONINI in the last 168 hours. BNP: Invalid input(s): POCBNP CBG: No results for input(s): GLUCAP in the last 168 hours.  Time coordinating discharge:  36 minutes  Signed:  10/28/20, DO [facility] Pager: [phone] 10/26/2020, 8:34 AM

## 2020-10-28 ENCOUNTER — Other Ambulatory Visit: Payer: Self-pay

## 2020-10-28 ENCOUNTER — Encounter: Payer: Self-pay | Admitting: Emergency Medicine

## 2020-10-28 ENCOUNTER — Ambulatory Visit (INDEPENDENT_AMBULATORY_CARE_PROVIDER_SITE_OTHER): Payer: No Typology Code available for payment source | Admitting: Emergency Medicine

## 2020-10-28 DIAGNOSIS — J449 Chronic obstructive pulmonary disease, unspecified: Secondary | ICD-10-CM

## 2020-10-28 DIAGNOSIS — Z9981 Dependence on supplemental oxygen: Secondary | ICD-10-CM | POA: Diagnosis not present

## 2020-10-28 DIAGNOSIS — J9611 Chronic respiratory failure with hypoxia: Secondary | ICD-10-CM

## 2020-10-28 MED ORDER — PREDNISONE 10 MG PO TABS: 20.0000 mg | ORAL_TABLET | Freq: Every day | ORAL | 0 refills | Status: AC

## 2020-10-28 MED ORDER — AZITHROMYCIN 250 MG PO TABS: 250.0000 mg | ORAL_TABLET | Freq: Every day | ORAL | 0 refills | Status: DC

## 2020-10-28 NOTE — Assessment & Plan Note (Signed)
Frequent exacerbations.  I suspect that he is having waxing waning symptoms depending on how much prednisone he is on.  He has been in the hospital.  We will add prednisone, azithromycin.  He may need Daliresp going forward.  I talked him today about symptom management even if we are able to make progress with these medications.  May need to refer him in a palliative direction at some point going forward.  Also briefly touched on CODE STATUS and the rationale for avoiding intubation if he were to have progressive respiratory failure.  We did not make any firm decisions about this but we will revisit.  Please continue your Symbicort 2 puffs twice a day.  Rinse and gargle after using. Please continue your Spiriva once daily. Keep your albuterol available use either 1 nebulizer treatment or 2 puffs up to every 4 hours if needed for shortness of breath, chest tightness, wheezing. Continue your prednisone taper down to 20 mg once daily and then stay at that dose indefinitely.  We can follow-up to discuss possible adjustments.  We will give you a prescription for prednisone to use long-term. Please start azithromycin 250 mg once daily. Flu shot up-to-date Follow with Dr. Delton Coombes in 2 months or sooner if you have any problems.

## 2020-10-28 NOTE — Assessment & Plan Note (Signed)
Continue your oxygen at 4 L/min.

## 2020-10-28 NOTE — Patient Instructions (Addendum)
Please continue your Symbicort 2 puffs twice a day.  Rinse and gargle after using. Please continue your Spiriva once daily. Keep your albuterol available use either 1 nebulizer treatment or 2 puffs up to every 4 hours if needed for shortness of breath, chest tightness, wheezing. Continue your prednisone taper down to 20 mg once daily and then stay at that dose indefinitely.  We can follow-up to discuss possible adjustments.  We will give you a prescription for prednisone to use long-term. Please start azithromycin 250 mg once daily. Continue your oxygen at 4 L/min. Flu shot up-to-date Follow with Dr. Delton Coombes in 2 months or sooner if you have any problems.

## 2020-10-28 NOTE — Progress Notes (Signed)
Subjective:    Patient ID: Jacob Pace, male    DOB: 09-Feb-1950, 70 y.o.   MRN: [id number]  HPI  ROV 06/12/20 --70 year old man, former smoker (50 pack years).  Have seen him in the past for emphysematous COPD with very severe obstruction and decreased diffusion capacity documented by PFT in 2012.  He has associated hypoxemic respiratory failure and uses oxygen at 4 L/min.  Also with a history of stable pulmonary nodular disease that was followed with serial CT scans.  It looks like he was admitted for acute exacerbations in January and then late March. On each occasion he had severe dyspnea, inability to walk across the room.  He has rare cough, prod of yellow. No PND.  We have been managing him on Spiriva and Symbicort - he is on wixella, changed by [place]. He feels this corresponds with when he started to have more trouble.   ROV 10/28/20 --Jacob Pace is 70, former smoker with emphysematous COPD and very severe obstruction.  He has associated hypoxemic respiratory failure on 4 L/min.  He has been experiencing frequent exacerbations.  I last saw him in May 2022 and since then he was hospitalized in August and then again this month.  I tried changing him to [facility] with a spacer to see if he would get more benefit.  He reports that his breathing is improved compared with recent hospitalization (just discharged). He can be a bit more active w his O2 on.    Review of Systems  Constitutional:  Negative for fever and unexpected weight change.  HENT:  Negative for congestion, dental problem, ear pain, nosebleeds, postnasal drip, rhinorrhea, sinus pressure, sneezing, sore throat and trouble swallowing.   Eyes:  Negative for redness and itching.  Respiratory:  Positive for shortness of breath and wheezing. Negative for cough and chest tightness.   Cardiovascular:  Negative for palpitations and leg swelling.  Gastrointestinal:  Negative for nausea and vomiting.  Genitourinary:  Negative for dysuria.   Musculoskeletal:  Negative for joint swelling.  Skin:  Negative for rash.  Neurological:  Negative for headaches.  Hematological:  Does not bruise/bleed easily.  Psychiatric/Behavioral:  Negative for dysphoric mood. The patient is not nervous/anxious.    Past Medical History:  Diagnosis Date   COPD (chronic obstructive pulmonary disease) (HCC)    Emphysema    Hypertension      Family History  Problem Relation Age of Onset   Cancer Father        unsure what type   Breast cancer Sister      Social History   Socioeconomic History   Marital status: Legally Separated    Spouse name: Not on file   Number of children: Not on file   Years of education: Not on file   Highest education level: Not on file  Occupational History   Occupation: textile plant  Tobacco Use   Smoking status: Former    Packs/day: 1.00    Years: 40.00    Pack years: 40.00    Types: Cigarettes    Quit date: 02/01/2008    Years since quitting: 12.7   Smokeless tobacco: Never  Vaping Use   Vaping Use: Never used  Substance and Sexual Activity   Alcohol use: Yes    Comment: weekends   Drug use: No   Sexual activity: Never  Other Topics Concern   Not on file  Social History Narrative   seperated with children  Social Determinants of Health   Financial Resource Strain: Not on file  Food Insecurity: Not on file  Transportation Needs: Not on file  Physical Activity: Not on file  Stress: Not on file  Social Connections: Not on file  Intimate Partner Violence: Not on file   He was in the [employer], was in [place], [place] until 1982, probable asbestos exposure. ? Agent Orange.  Has lived in [place], [place], [place].   No Known Allergies   Outpatient Medications Prior to Visit  Medication Sig Dispense Refill   albuterol (PROVENTIL) (2.5 MG/3ML) 0.083% nebulizer solution Take 6 mLs (5 mg total) by nebulization every 4 (four) hours as needed for wheezing or shortness of breath. 75 mL 5    budesonide-formoterol (SYMBICORT) 160-4.5 MCG/ACT inhaler Inhale 2 puffs into the lungs 2 (two) times daily. 1 each 12   folic acid (FOLVITE) 1 MG tablet Take 1 tablet (1 mg total) by mouth daily. 30 tablet 3   predniSONE (DELTASONE) 10 MG tablet Take 6 tablets (60 mg total) by mouth daily with breakfast. And decrease by one tablet daily 21 tablet 0   tiotropium (SPIRIVA) 18 MCG inhalation capsule Place 1 capsule (18 mcg total) into inhaler and inhale daily. 30 capsule 5   diltiazem (CARDIZEM CD) 180 MG 24 hr capsule Take 1 capsule (180 mg total) by mouth daily. (Patient not taking: Reported on 10/28/2020) 30 capsule 5   No facility-administered medications prior to visit.        Objective:   Physical Exam  Vitals:   10/28/20 1049  BP: 120/62  Pulse: 65  Temp: 97.9 F (36.6 C)  TempSrc: Oral  SpO2: 97%  Weight: 217 lb (98.4 kg)  Height: 6\' 2"  (1.88 m)   Gen: Pleasant, elderly man, in no distress,  normal affect  ENT: No lesions,  mouth clear,  oropharynx clear, no postnasal drip  Neck: No JVD, no stridor  Lungs: No use of accessory muscles, distant, no crackles or wheezing on normal respiration, no wheeze on forced expiration  Cardiovascular: RRR, heart sounds normal, no murmur or gallops, no peripheral edema  Musculoskeletal: No deformities, no cyanosis or clubbing  Neuro: alert, awake, non focal  Skin: Warm, no lesions or rash      Assessment & Plan:  Chronic obstructive pulmonary disease (HCC) Frequent exacerbations.  I suspect that he is having waxing waning symptoms depending on how much prednisone he is on.  He has been in the hospital.  We will add prednisone, azithromycin.  He may need Daliresp going forward.  I talked him today about symptom management even if we are able to make progress with these medications.  May need to refer him in a palliative direction at some point going forward.  Also briefly touched on CODE STATUS and the rationale for avoiding  intubation if he were to have progressive respiratory failure.  We did not make any firm decisions about this but we will revisit.  Please continue your Symbicort 2 puffs twice a day.  Rinse and gargle after using. Please continue your Spiriva once daily. Keep your albuterol available use either 1 nebulizer treatment or 2 puffs up to every 4 hours if needed for shortness of breath, chest tightness, wheezing. Continue your prednisone taper down to 20 mg once daily and then stay at that dose indefinitely.  We can follow-up to discuss possible adjustments.  We will give you a prescription for prednisone to use long-term. Please start azithromycin 250 mg once daily. Flu  shot up-to-date Follow with Dr. Delton Coombes in 2 months or sooner if you have any problems.   Chronic respiratory failure with hypoxia, on home O2 therapy (HCC) Continue your oxygen at 4 L/min.   Levy Pupa, MD, PhD 10/28/2020, 11:29 AM [facility] [phone] or if no answer [phone]

## 2020-10-28 NOTE — Addendum Note (Signed)
Addended by: Dorisann Frames R on: 10/28/2020 12:19 PM   Modules accepted: Orders

## 2020-12-06 ENCOUNTER — Other Ambulatory Visit: Payer: Self-pay | Admitting: Emergency Medicine

## 2020-12-07 ENCOUNTER — Inpatient Hospital Stay (HOSPITAL_COMMUNITY)
Admission: EM | Admit: 2020-12-07 | Discharge: 2020-12-10 | DRG: 190 | Disposition: A | Discharge status: Home / Self Care | Payer: No Typology Code available for payment source | Attending: Internal Medicine | Admitting: Internal Medicine

## 2020-12-07 DIAGNOSIS — Z87891 Personal history of nicotine dependence: Secondary | ICD-10-CM

## 2020-12-07 DIAGNOSIS — J439 Emphysema, unspecified: Principal | ICD-10-CM | POA: Diagnosis present

## 2020-12-07 DIAGNOSIS — D649 Anemia, unspecified: Secondary | ICD-10-CM | POA: Diagnosis present

## 2020-12-07 DIAGNOSIS — J9621 Acute and chronic respiratory failure with hypoxia: Secondary | ICD-10-CM

## 2020-12-07 DIAGNOSIS — Z7952 Long term (current) use of systemic steroids: Secondary | ICD-10-CM

## 2020-12-07 DIAGNOSIS — M7989 Other specified soft tissue disorders: Secondary | ICD-10-CM | POA: Diagnosis present

## 2020-12-07 DIAGNOSIS — I10 Essential (primary) hypertension: Secondary | ICD-10-CM | POA: Diagnosis present

## 2020-12-07 DIAGNOSIS — I872 Venous insufficiency (chronic) (peripheral): Secondary | ICD-10-CM | POA: Diagnosis present

## 2020-12-07 DIAGNOSIS — I82409 Acute embolism and thrombosis of unspecified deep veins of unspecified lower extremity: Secondary | ICD-10-CM

## 2020-12-07 DIAGNOSIS — Z7951 Long term (current) use of inhaled steroids: Secondary | ICD-10-CM

## 2020-12-07 DIAGNOSIS — Y92009 Unspecified place in unspecified non-institutional (private) residence as the place of occurrence of the external cause: Secondary | ICD-10-CM

## 2020-12-07 DIAGNOSIS — Z79899 Other long term (current) drug therapy: Secondary | ICD-10-CM

## 2020-12-07 DIAGNOSIS — Z803 Family history of malignant neoplasm of breast: Secondary | ICD-10-CM

## 2020-12-07 DIAGNOSIS — J441 Chronic obstructive pulmonary disease with (acute) exacerbation: Secondary | ICD-10-CM | POA: Diagnosis present

## 2020-12-07 DIAGNOSIS — T380X6A Underdosing of glucocorticoids and synthetic analogues, initial encounter: Secondary | ICD-10-CM | POA: Diagnosis present

## 2020-12-07 DIAGNOSIS — D72829 Elevated white blood cell count, unspecified: Secondary | ICD-10-CM | POA: Diagnosis present

## 2020-12-07 DIAGNOSIS — Z20822 Contact with and (suspected) exposure to covid-19: Secondary | ICD-10-CM | POA: Diagnosis present

## 2020-12-07 MED ORDER — LEVALBUTEROL HCL 0.63 MG/3ML IN NEBU
INHALATION_SOLUTION | RESPIRATORY_TRACT | Status: AC
  Administered 2020-12-08: 0.63 mg
  Filled 2020-12-07: qty 9

## 2020-12-07 MED ORDER — IPRATROPIUM BROMIDE 0.02 % IN SOLN
RESPIRATORY_TRACT | Status: AC
  Administered 2020-12-08: 0.5 mg
  Filled 2020-12-07: qty 2.5

## 2020-12-07 NOTE — ED Triage Notes (Signed)
PER EMS: pt from home with c/o SOB that started around 2100 without relief from home albuterol txt and 4L [place]. EMS adm 5mg  albuterol and received 125 of solumedrol. Denies any pain.   BP - 162/84, HR-83, 96% 4L [place]

## 2020-12-08 ENCOUNTER — Encounter (HOSPITAL_COMMUNITY): Payer: Self-pay

## 2020-12-08 ENCOUNTER — Other Ambulatory Visit: Payer: Self-pay

## 2020-12-08 ENCOUNTER — Emergency Department (HOSPITAL_COMMUNITY): Payer: No Typology Code available for payment source

## 2020-12-08 DIAGNOSIS — J9621 Acute and chronic respiratory failure with hypoxia: Secondary | ICD-10-CM

## 2020-12-08 DIAGNOSIS — I10 Essential (primary) hypertension: Secondary | ICD-10-CM

## 2020-12-08 DIAGNOSIS — D72829 Elevated white blood cell count, unspecified: Secondary | ICD-10-CM

## 2020-12-08 DIAGNOSIS — J441 Chronic obstructive pulmonary disease with (acute) exacerbation: Secondary | ICD-10-CM

## 2020-12-08 LAB — CBC WITH DIFFERENTIAL/PLATELET
Abs Immature Granulocytes: 0.06 10*3/uL (ref 0.00–0.07)
Basophils Absolute: 0.1 10*3/uL (ref 0.0–0.1)
Basophils Relative: 0 %
Eosinophils Absolute: 0.2 10*3/uL (ref 0.0–0.5)
Eosinophils Relative: 2 %
HCT: 40.4 % (ref 39.0–52.0)
Hemoglobin: 12.4 g/dL — ABNORMAL LOW (ref 13.0–17.0)
Immature Granulocytes: 0 %
Lymphocytes Relative: 17 %
Lymphs Abs: 2.4 10*3/uL (ref 0.7–4.0)
MCH: 29 pg (ref 26.0–34.0)
MCHC: 30.7 g/dL (ref 30.0–36.0)
MCV: 94.6 fL (ref 80.0–100.0)
Monocytes Absolute: 1.5 10*3/uL — ABNORMAL HIGH (ref 0.1–1.0)
Monocytes Relative: 10 %
Neutro Abs: 9.7 10*3/uL — ABNORMAL HIGH (ref 1.7–7.7)
Neutrophils Relative %: 71 %
Platelets: 230 10*3/uL (ref 150–400)
RBC: 4.27 MIL/uL (ref 4.22–5.81)
RDW: 14.4 % (ref 11.5–15.5)
WBC: 13.9 10*3/uL — ABNORMAL HIGH (ref 4.0–10.5)
nRBC: 0 % (ref 0.0–0.2)

## 2020-12-08 LAB — RESP PANEL BY RT-PCR (FLU A&B, COVID) ARPGX2
Influenza A by PCR: NEGATIVE
Influenza B by PCR: NEGATIVE
SARS Coronavirus 2 by RT PCR: NEGATIVE

## 2020-12-08 LAB — MAGNESIUM: Magnesium: 1.9 mg/dL (ref 1.7–2.4)

## 2020-12-08 LAB — BASIC METABOLIC PANEL
Anion gap: 9 (ref 5–15)
BUN: 8 mg/dL (ref 8–23)
CO2: 30 mmol/L (ref 22–32)
Calcium: 8.9 mg/dL (ref 8.9–10.3)
Chloride: 100 mmol/L (ref 98–111)
Creatinine, Ser: 0.72 mg/dL (ref 0.61–1.24)
GFR, Estimated: >60 mL/min (ref >60)
Glucose, Bld: 113 mg/dL — ABNORMAL HIGH (ref 70–99)
Potassium: 3.9 mmol/L (ref 3.5–5.1)
Sodium: 139 mmol/L (ref 135–145)

## 2020-12-08 LAB — PHOSPHORUS: Phosphorus: 2.3 mg/dL — ABNORMAL LOW (ref 2.5–4.6)

## 2020-12-08 LAB — BRAIN NATRIURETIC PEPTIDE: B Natriuretic Peptide: 33 pg/mL (ref 0.0–100.0)

## 2020-12-08 LAB — APTT: aPTT: 31 seconds (ref 24–36)

## 2020-12-08 MED ORDER — PANTOPRAZOLE SODIUM 40 MG PO TBEC
40.0000 mg | DELAYED_RELEASE_TABLET | Freq: Every day | ORAL | Status: DC
  Administered 2020-12-08 – 2020-12-10 (×3): 40 mg via ORAL
  Filled 2020-12-08 (×3): qty 1

## 2020-12-08 MED ORDER — MOMETASONE FURO-FORMOTEROL FUM 200-5 MCG/ACT IN AERO
2.0000 | INHALATION_SPRAY | Freq: Two times a day (BID) | RESPIRATORY_TRACT | Status: DC
  Administered 2020-12-08 – 2020-12-10 (×4): 2 via RESPIRATORY_TRACT
  Filled 2020-12-08: qty 8.8

## 2020-12-08 MED ORDER — DILTIAZEM HCL ER COATED BEADS 180 MG PO CP24
180.0000 mg | ORAL_CAPSULE | Freq: Every day | ORAL | Status: DC
  Administered 2020-12-08 – 2020-12-09 (×2): 180 mg via ORAL
  Filled 2020-12-08 (×2): qty 1

## 2020-12-08 MED ORDER — IPRATROPIUM-ALBUTEROL 0.5-2.5 (3) MG/3ML IN SOLN
3.0000 mL | RESPIRATORY_TRACT | Status: DC | PRN
  Filled 2020-12-08: qty 3

## 2020-12-08 MED ORDER — METHYLPREDNISOLONE SODIUM SUCC 40 MG IJ SOLR
40.0000 mg | Freq: Two times a day (BID) | INTRAMUSCULAR | Status: DC
  Administered 2020-12-08 – 2020-12-10 (×5): 40 mg via INTRAVENOUS
  Filled 2020-12-08 (×5): qty 1

## 2020-12-08 MED ORDER — AZITHROMYCIN 250 MG PO TABS
500.0000 mg | ORAL_TABLET | Freq: Every day | ORAL | Status: AC
  Administered 2020-12-08: 500 mg via ORAL
  Filled 2020-12-08: qty 2

## 2020-12-08 MED ORDER — IPRATROPIUM-ALBUTEROL 0.5-2.5 (3) MG/3ML IN SOLN
3.0000 mL | Freq: Four times a day (QID) | RESPIRATORY_TRACT | Status: DC
  Administered 2020-12-08 – 2020-12-10 (×9): 3 mL via RESPIRATORY_TRACT
  Filled 2020-12-08 (×8): qty 3

## 2020-12-08 MED ORDER — DM-GUAIFENESIN ER 30-600 MG PO TB12
1.0000 | ORAL_TABLET | Freq: Two times a day (BID) | ORAL | Status: DC
  Administered 2020-12-08 – 2020-12-10 (×5): 1 via ORAL
  Filled 2020-12-08 (×5): qty 1

## 2020-12-08 MED ORDER — K PHOS MONO-SOD PHOS DI & MONO 155-852-130 MG PO TABS
500.0000 mg | ORAL_TABLET | Freq: Two times a day (BID) | ORAL | Status: AC
  Administered 2020-12-08 – 2020-12-09 (×4): 500 mg via ORAL
  Filled 2020-12-08 (×4): qty 2

## 2020-12-08 MED ORDER — FUROSEMIDE 10 MG/ML IJ SOLN
40.0000 mg | INTRAMUSCULAR | Status: AC
  Administered 2020-12-08: 40 mg via INTRAVENOUS
  Filled 2020-12-08: qty 4

## 2020-12-08 MED ORDER — AZITHROMYCIN 250 MG PO TABS
250.0000 mg | ORAL_TABLET | Freq: Every day | ORAL | Status: DC
  Administered 2020-12-09 – 2020-12-10 (×2): 250 mg via ORAL
  Filled 2020-12-08 (×2): qty 1

## 2020-12-08 MED ORDER — ENOXAPARIN SODIUM 40 MG/0.4ML IJ SOSY
40.0000 mg | PREFILLED_SYRINGE | INTRAMUSCULAR | Status: DC
  Administered 2020-12-08 – 2020-12-10 (×2): 40 mg via SUBCUTANEOUS
  Filled 2020-12-08 (×3): qty 0.4

## 2020-12-08 MED ORDER — FOLIC ACID 1 MG PO TABS
1.0000 mg | ORAL_TABLET | Freq: Every day | ORAL | Status: DC
  Administered 2020-12-08 – 2020-12-10 (×3): 1 mg via ORAL
  Filled 2020-12-08 (×3): qty 1

## 2020-12-08 NOTE — H&P (Signed)
History and Physical  Jacob Pace ZHG:[id number] DOB: 11/14/1950 DOA: 12/07/2020  Referring physician:  Gilda Crease, MD PCP: [facility]  Patient coming from: Home  Chief Complaint: Shortness of breath  HPI: Jacob Pace is a 70 y.o. male with medical history significant for essential hypertension, chronic respiratory failure on supplemental oxygen at 4 LPM and COPD who presents to the emergency department due to worsening shortness of breath which started at home yesterday, he states that he tried home breathing treatments x4 without any relief, so he decided to activate EMS.  On arrival of EMS, IV Solu-Medrol 125 mg x 1 was given and breathing treatment with albuterol was given en route to the ED with only minimal improvement.  He denies chest pain, fever, cough, exposure to sick contacts. Patient was admitted from 9/22- 9/26 due to COPD exacerbation, at that time, he was placed on BiPAP prior to transitioning to supplemental oxygen via [place].  ED Course:  In the emergency department, he was tachypneic, BP was 153/81, but other vital signs were within normal range, O2 sat ranged within 94-97% on supplemental oxygen via [place] at 4 LPM.  Work-up in the ED showed normocytic anemia and leukocytosis, BMP was normal.  Influenza A, B, SARS coronavirus 2 was negative. Chest x-ray showed mild cardiomegaly with mild central vascular congestion.  No focal consolidation. Breathing treatment with Xopenex was provided in the ED per ED physician.  Hospitalist was asked to admit patient for further evaluation and management.  Review of Systems: Constitutional: Negative for chills and fever.  HENT: Negative for ear pain and sore throat.   Eyes: Negative for pain and visual disturbance.  Respiratory: Positive for shortness of breath.  Negative for cough, chest tightness   Cardiovascular: Negative for chest pain and palpitations.  Gastrointestinal: Negative for abdominal pain and  vomiting.  Endocrine: Negative for polyphagia and polyuria.  Genitourinary: Negative for decreased urine volume, dysuria, enuresis Musculoskeletal: Negative for arthralgias and back pain.  Skin: Negative for color change and rash.  Allergic/Immunologic: Negative for immunocompromised state.  Neurological: Negative for tremors, syncope, speech difficulty, weakness, light-headedness and headaches.  Hematological: Does not bruise/bleed easily.  All other systems reviewed and are negative   Past Medical History:  Diagnosis Date   COPD (chronic obstructive pulmonary disease) (HCC)    Emphysema    Hypertension    Past Surgical History:  Procedure Laterality Date   HERNIA REPAIR     right inguinal hernia repair  2007    Social History:  reports that he quit smoking about 12 years ago. His smoking use included cigarettes. He has a 40.00 pack-year smoking history. He has never used smokeless tobacco. He reports current alcohol use. He reports that he does not use drugs.   No Known Allergies  Family History  Problem Relation Age of Onset   Cancer Father        unsure what type   Breast cancer Sister      Prior to Admission medications   Medication Sig Start Date End Date Taking? Authorizing Provider  albuterol (PROVENTIL) (2.5 MG/3ML) 0.083% nebulizer solution Take 6 mLs (5 mg total) by nebulization every 4 (four) hours as needed for wheezing or shortness of breath. 09/25/20   Mariea Clonts, Courage, MD  azithromycin (ZITHROMAX) 250 MG tablet Take 1 tablet (250 mg total) by mouth daily. 10/28/20   Leslye Peer, MD  budesonide-formoterol (SYMBICORT) 160-4.5 MCG/ACT inhaler Inhale 2 puffs into the lungs 2 (two) times  daily. 09/25/20   Roxan Hockey, MD  diltiazem (CARDIZEM CD) 180 MG 24 hr capsule Take 1 capsule (180 mg total) by mouth daily. Patient not taking: Reported on 10/28/2020 09/25/20   Roxan Hockey, MD  folic acid (FOLVITE) 1 MG tablet Take 1 tablet (1 mg total) by mouth daily.  09/26/20   Roxan Hockey, MD  predniSONE (DELTASONE) 10 MG tablet Take 6 tablets (60 mg total) by mouth daily with breakfast. And decrease by one tablet daily 10/27/20   Tat, Shanon Brow, MD  tiotropium (SPIRIVA) 18 MCG inhalation capsule Place 1 capsule (18 mcg total) into inhaler and inhale daily. 09/25/20   Roxan Hockey, MD    Physical Exam: BP (!) 153/80   Pulse 91   Temp 98.5 F (36.9 C) (Oral)   Resp (!) 27   Ht 6\' 2"  (1.88 m)   Wt 98.4 kg   SpO2 97%   BMI 27.86 kg/m   General: 70 y.o. year-old male well developed well nourished in no acute distress.  Alert and oriented x3. HEENT: NCAT, EOMI Neck: Supple, trachea medial Cardiovascular: Regular rate and rhythm with no rubs or gallops.  No thyromegaly or JVD noted.  No lower extremity edema. 2/4 pulses in all 4 extremities. Respiratory: Tachypnea, decreased breath sounds, patient noted in tripod position with accessory muscle use.   Abdomen: Soft, nontender nondistended with normal bowel sounds x4 quadrants. Muskuloskeletal: No cyanosis, clubbing or edema noted bilaterally Neuro: CN II-XII intact, strength 5/5 x 4, sensation, reflexes intact Skin: No ulcerative lesions noted or rashes Psychiatry: Judgement and insight appear normal. Mood is appropriate for condition and setting          Labs on Admission:  Basic Metabolic Panel: Recent Labs  Lab 12/08/20 0000  NA 139  K 3.9  CL 100  CO2 30  GLUCOSE 113*  BUN 8  CREATININE 0.72  CALCIUM 8.9   Liver Function Tests: No results for input(s): AST, ALT, ALKPHOS, BILITOT, PROT, ALBUMIN in the last 168 hours. No results for input(s): LIPASE, AMYLASE in the last 168 hours. No results for input(s): AMMONIA in the last 168 hours. CBC: Recent Labs  Lab 12/08/20 0000  WBC 13.9*  NEUTROABS 9.7*  HGB 12.4*  HCT 40.4  MCV 94.6  PLT 230   Cardiac Enzymes: No results for input(s): CKTOTAL, CKMB, CKMBINDEX, TROPONINI in the last 168 hours.  BNP (last 3 results) Recent  Labs    04/28/20 0416 10/20/20 2355 10/21/20 0439  BNP 100.0 25.0 22.0    ProBNP (last 3 results) No results for input(s): PROBNP in the last 8760 hours.  CBG: No results for input(s): GLUCAP in the last 168 hours.  Radiological Exams on Admission: DG Chest Port 1 View  Result Date: 12/08/2020 CLINICAL DATA:  Shortness of breath. EXAM: PORTABLE CHEST 1 VIEW COMPARISON:  Chest radiograph dated 10/21/2020. FINDINGS: Mild cardiomegaly with mild central vascular congestion. No focal consolidation, pleural effusion, or pneumothorax. Background of emphysema. Atherosclerotic calcification of the aorta. No acute osseous pathology. IMPRESSION: Mild cardiomegaly with mild central vascular congestion. No focal consolidation. Electronically Signed   By: Anner Crete M.D.   On: 12/08/2020 00:42    EKG: I independently viewed the EKG done and my findings are as followed: Normal sinus rhythm at a rate of 85 bpm  Assessment/Plan Present on Admission:  Acute exacerbation of chronic obstructive pulmonary disease (COPD) (HCC)  Leukocytosis  Essential (primary) hypertension  Principal Problem:   Acute exacerbation of chronic obstructive pulmonary disease (COPD) (  [place]) Active Problems:   Acute on chronic respiratory failure with hypoxia (HCC)   Essential (primary) hypertension   Leukocytosis  Acute on chronic respiratory failure with hypoxia secondary to acute exacerbation of COPD Continue duo nebs, Mucinex, Solu-Medrol, Dulera, azithromycin. Continue Protonix to prevent steroid-induced ulcer Continue incentive spirometry and flutter valve Continue supplemental oxygen to maintain O2 sat > 92% with plan to wean patient off oxygen as tolerated  Leukocytosis possibly secondary to steroid effect Continue treatment as described above  Essential hypertension (uncontrolled) Continue diltiazem   Other home meds: Folic acid   DVT prophylaxis: Lovenox  Code Status: Full code  Family  Communication: None at bedside  Disposition Plan:  Patient is from:                        home Anticipated DC to:                   SNF or family members home Anticipated DC date:               2-3 days Anticipated DC barriers:          Patient requires inpatient management due to COPD exacerbation that was unresponsive to home breathing treatment regimen  Consults called: None  Admission status: Observation   Bernadette Hoit MD [facility] 12/08/2020, 2:46 AM

## 2020-12-08 NOTE — ED Provider Notes (Signed)
[facility] Provider Note   CSN: [id number] Arrival date & time: 12/07/20  2353     History Chief Complaint  Patient presents with   Shortness of Breath    Jacob CheadleRobert L Pace is a 70 y.o. male.  Patient presents to the emergency department for evaluation of shortness of breath.  Patient reports that his breathing difficulty began earlier today.  Over the course of tonight, however, his breathing has significantly worsened.  He tried 3 breathing treatments at home with minimal improvement.  EMS report administering Solu-Medrol 125 mg, 5 mg of albuterol during transport.  Patient reports minimal improvement.  Patient has not had any fever or cough.  Denies chest pain.      Past Medical History:  Diagnosis Date   COPD (chronic obstructive pulmonary disease) (HCC)    Emphysema    Hypertension     Patient Active Problem List   Diagnosis Date Noted   Dependence on supplemental oxygen when ambulating 10/21/2020   Elevated blood-pressure reading without diagnosis of hypertension 10/21/2020   Hypoxemia 10/21/2020   Other and unspecified hyperlipidemia 10/21/2020   Other specified counseling 10/21/2020   Raised prostate specific antigen 10/21/2020   Tobacco user 10/21/2020   Gastroesophageal reflux disease 10/21/2020   Leukocytosis 04/27/2020   COPD exacerbation (HCC) 04/27/2020   Chronic respiratory failure with hypoxia, on home O2 therapy (HCC) 02/03/2020   Essential (primary) hypertension 02/03/2020   Chronic obstructive pulmonary disease with (acute) lower respiratory infection (HCC) 02/03/2020   Acute on chronic respiratory failure with hypercapnia (HCC) 02/08/2013   PSVT (paroxysmal supraventricular tachycardia) (HCC) 02/07/2013   Chronic obstructive pulmonary disease (HCC) 02/05/2013   BRONCHITIS, ACUTE 04/02/2010   GASTRITIS 04/17/2008   GASTROINTESTINAL XRAY, ABNORMAL 04/11/2008   EMPHYSEMA 06/15/2007   Nonspecific (abnormal) findings on radiological  and other examination of body structure 06/15/2007   ABNORMAL CHEST XRAY 06/15/2007    Past Surgical History:  Procedure Laterality Date   HERNIA REPAIR     right inguinal hernia repair  2007       Family History  Problem Relation Age of Onset   Cancer Father        unsure what type   Breast cancer Sister     Social History   Tobacco Use   Smoking status: Former    Packs/day: 1.00    Years: 40.00    Pack years: 40.00    Types: Cigarettes    Quit date: 02/01/2008    Years since quitting: 12.8   Smokeless tobacco: Never  Vaping Use   Vaping Use: Never used  Substance Use Topics   Alcohol use: Yes    Comment: weekends   Drug use: No    Home Medications Prior to Admission medications   Medication Sig Start Date End Date Taking? Authorizing Provider  albuterol (PROVENTIL) (2.5 MG/3ML) 0.083% nebulizer solution Take 6 mLs (5 mg total) by nebulization every 4 (four) hours as needed for wheezing or shortness of breath. 09/25/20   Mariea ClontsEmokpae, Courage, MD  azithromycin (ZITHROMAX) 250 MG tablet Take 1 tablet (250 mg total) by mouth daily. 10/28/20   Leslye PeerByrum, Domenic S, MD  budesonide-formoterol (SYMBICORT) 160-4.5 MCG/ACT inhaler Inhale 2 puffs into the lungs 2 (two) times daily. 09/25/20   Shon HaleEmokpae, Courage, MD  diltiazem (CARDIZEM CD) 180 MG 24 hr capsule Take 1 capsule (180 mg total) by mouth daily. Patient not taking: Reported on 10/28/2020 09/25/20   Shon HaleEmokpae, Courage, MD  folic acid (FOLVITE) 1 MG tablet Take 1  tablet (1 mg total) by mouth daily. 09/26/20   Shon Hale, MD  predniSONE (DELTASONE) 10 MG tablet Take 6 tablets (60 mg total) by mouth daily with breakfast. And decrease by one tablet daily 10/27/20   Tat, Onalee Hua, MD  tiotropium (SPIRIVA) 18 MCG inhalation capsule Place 1 capsule (18 mcg total) into inhaler and inhale daily. 09/25/20   Shon Hale, MD    Allergies    Patient has no known allergies.  Review of Systems   Review of Systems  Respiratory:  Positive for  shortness of breath.   All other systems reviewed and are negative.  Physical Exam Updated Vital Signs BP (!) 153/81 (BP Location: Right Arm)   Pulse 92   Temp 98.5 F (36.9 C) (Oral)   Resp (!) 25   Ht 6\' 2"  (1.88 m)   Wt 98.4 kg   SpO2 92%   BMI 27.86 kg/m   Physical Exam Vitals and nursing note reviewed.  Constitutional:      General: He is not in acute distress.    Appearance: Normal appearance. He is well-developed.  HENT:     Head: Normocephalic and atraumatic.     Right Ear: Hearing normal.     Left Ear: Hearing normal.     Nose: Nose normal.  Eyes:     Conjunctiva/sclera: Conjunctivae normal.     Pupils: Pupils are equal, round, and reactive to light.  Cardiovascular:     Rate and Rhythm: Regular rhythm.     Heart sounds: S1 normal and S2 normal. No murmur heard.   No friction rub. No gallop.  Pulmonary:     Effort: Tachypnea and accessory muscle usage (sitting in tripod position, pursed lip breathing) present. No respiratory distress.     Breath sounds: Decreased breath sounds (minimal air movement) present.  Chest:     Chest wall: No tenderness.  Abdominal:     General: Bowel sounds are normal.     Palpations: Abdomen is soft.     Tenderness: There is no abdominal tenderness. There is no guarding or rebound. Negative signs include Murphy's sign and McBurney's sign.     Hernia: No hernia is present.  Musculoskeletal:        General: Normal range of motion.     Cervical back: Normal range of motion and neck supple.  Skin:    General: Skin is warm and dry.     Findings: No rash.  Neurological:     Mental Status: He is alert and oriented to person, place, and time.     GCS: GCS eye subscore is 4. GCS verbal subscore is 5. GCS motor subscore is 6.     Cranial Nerves: No cranial nerve deficit.     Sensory: No sensory deficit.     Coordination: Coordination normal.  Psychiatric:        Speech: Speech normal.        Behavior: Behavior normal.        Thought  Content: Thought content normal.    ED Results / Procedures / Treatments   Labs (all labs ordered are listed, but only abnormal results are displayed) Labs Reviewed  CBC WITH DIFFERENTIAL/PLATELET - Abnormal; Notable for the following components:      Result Value   WBC 13.9 (*)    Hemoglobin 12.4 (*)    Neutro Abs 9.7 (*)    Monocytes Absolute 1.5 (*)    All other components within normal limits  BASIC METABOLIC PANEL - Abnormal; Notable  for the following components:   Glucose, Bld 113 (*)    All other components within normal limits  RESP PANEL BY RT-PCR (FLU A&B, COVID) [id number]    EKG None  Radiology DG Chest Port 1 View  Result Date: 12/08/2020 CLINICAL DATA:  Shortness of breath. EXAM: PORTABLE CHEST 1 VIEW COMPARISON:  Chest radiograph dated 10/21/2020. FINDINGS: Mild cardiomegaly with mild central vascular congestion. No focal consolidation, pleural effusion, or pneumothorax. Background of emphysema. Atherosclerotic calcification of the aorta. No acute osseous pathology. IMPRESSION: Mild cardiomegaly with mild central vascular congestion. No focal consolidation. Electronically Signed   By: Anner Crete M.D.   On: 12/08/2020 00:42    Procedures Procedures   Medications Ordered in ED Medications  levalbuterol (XOPENEX) 0.63 MG/3ML nebulizer solution (0.63 mg  Given 12/08/20 0003)  ipratropium (ATROVENT) 0.02 % nebulizer solution (0.5 mg  Given 12/08/20 0003)    ED Course  I have reviewed the triage vital signs and the nursing notes.  Pertinent labs & imaging results that were available during my care of the patient were reviewed by me and considered in my medical decision making (see chart for details).    MDM Rules/Calculators/A&P                           Patient presents to the emergency department for evaluation of difficulty breathing.  Patient has history of severe COPD.  Patient reports increased work of breathing tonight.  He is brought to the ER after  having been given breathing treatments and Solu-Medrol by EMS.  At arrival he is still exhibiting significantly increased work of breathing.  Patient leaning forward in the tripod position, tachypneic with pursed lip breathing.  Patient administered Xopenex.  He is still exhibiting difficulty breathing, will require hospitalization for further management.  Final Clinical Impression(s) / ED Diagnoses Final diagnoses:  COPD exacerbation ([place])    Rx / DC Orders ED Discharge Orders     None        Hoang Reich, Gwenyth Allegra, MD 12/08/20 0136

## 2020-12-08 NOTE — ED Notes (Signed)
X-ray at bedside

## 2020-12-08 NOTE — Progress Notes (Signed)
Jacob Pace is sitting upright in his bed, watching TV this evening. He has no complaints but wanted to make sure I was going to give him his steroid. Will continue to monitor.

## 2020-12-08 NOTE — Social Work (Signed)
[facility] notified of pts admission. Notification ID is [id number].

## 2020-12-08 NOTE — Progress Notes (Signed)
Jacob Pace is a 70 y.o. male with medical history significant for essential hypertension, chronic respiratory failure on supplemental oxygen at 4 LPM and COPD who presents to the emergency department due to worsening shortness of breath which started at home yesterday, he states that he tried home breathing treatments x4 without any relief, so he decided to activate EMS.  On arrival of EMS, IV Solu-Medrol 125 mg x 1 was given and breathing treatment with albuterol was given en route to the ED with only minimal improvement.  He denies chest pain, fever, cough, exposure to sick contacts. Patient was admitted from 9/22- 9/26 due to COPD exacerbation, at that time, he was placed on BiPAP prior to transitioning to supplemental oxygen via [place].   ED Course:  In the emergency department, he was tachypneic, BP was 153/81, but other vital signs were within normal range, O2 sat ranged within 94-97% on supplemental oxygen via [place] at 4 LPM.  Work-up in the ED showed normocytic anemia and leukocytosis, BMP was normal.  Influenza A, B, SARS coronavirus 2 was negative. Chest x-ray showed mild cardiomegaly with mild central vascular congestion.  No focal consolidation. Breathing treatment with Xopenex was provided in the ED per ED physician.  Hospitalist was asked to admit patient for further evaluation and management.  12/08/2020: Patient was seen and examined at his bedside.  Reports persistent dyspnea with minimal exertion.  Denies any chest pain.   Also reports bilateral lower extremity edema, 1+ pitting edema noted on exam.  Added BNP, follow.  Mild cardiomegaly with mild central vascular congestion seen on chest x-ray, 1 dose of IV Lasix given with strict I's and O's and daily weight in place.    Please refer to H&P dated by my partner Dr. Thomes Dinning on 12/08/2020 for further details of the assessment and plan.

## 2020-12-09 ENCOUNTER — Observation Stay (HOSPITAL_COMMUNITY): Payer: No Typology Code available for payment source

## 2020-12-09 ENCOUNTER — Encounter (HOSPITAL_COMMUNITY): Payer: Self-pay | Admitting: Internal Medicine

## 2020-12-09 DIAGNOSIS — Z20822 Contact with and (suspected) exposure to covid-19: Secondary | ICD-10-CM | POA: Diagnosis present

## 2020-12-09 DIAGNOSIS — J9621 Acute and chronic respiratory failure with hypoxia: Secondary | ICD-10-CM | POA: Diagnosis present

## 2020-12-09 DIAGNOSIS — D72829 Elevated white blood cell count, unspecified: Secondary | ICD-10-CM | POA: Diagnosis present

## 2020-12-09 DIAGNOSIS — J439 Emphysema, unspecified: Secondary | ICD-10-CM | POA: Diagnosis present

## 2020-12-09 DIAGNOSIS — Z803 Family history of malignant neoplasm of breast: Secondary | ICD-10-CM | POA: Diagnosis not present

## 2020-12-09 DIAGNOSIS — Y92009 Unspecified place in unspecified non-institutional (private) residence as the place of occurrence of the external cause: Secondary | ICD-10-CM | POA: Diagnosis not present

## 2020-12-09 DIAGNOSIS — M7989 Other specified soft tissue disorders: Secondary | ICD-10-CM | POA: Diagnosis present

## 2020-12-09 DIAGNOSIS — D649 Anemia, unspecified: Secondary | ICD-10-CM | POA: Diagnosis present

## 2020-12-09 DIAGNOSIS — T380X6A Underdosing of glucocorticoids and synthetic analogues, initial encounter: Secondary | ICD-10-CM | POA: Diagnosis present

## 2020-12-09 DIAGNOSIS — Z87891 Personal history of nicotine dependence: Secondary | ICD-10-CM | POA: Diagnosis not present

## 2020-12-09 DIAGNOSIS — Z7952 Long term (current) use of systemic steroids: Secondary | ICD-10-CM | POA: Diagnosis not present

## 2020-12-09 DIAGNOSIS — I872 Venous insufficiency (chronic) (peripheral): Secondary | ICD-10-CM | POA: Diagnosis present

## 2020-12-09 DIAGNOSIS — I10 Essential (primary) hypertension: Secondary | ICD-10-CM | POA: Diagnosis present

## 2020-12-09 DIAGNOSIS — J441 Chronic obstructive pulmonary disease with (acute) exacerbation: Secondary | ICD-10-CM | POA: Diagnosis present

## 2020-12-09 DIAGNOSIS — Z7951 Long term (current) use of inhaled steroids: Secondary | ICD-10-CM | POA: Diagnosis not present

## 2020-12-09 DIAGNOSIS — Z79899 Other long term (current) drug therapy: Secondary | ICD-10-CM | POA: Diagnosis not present

## 2020-12-09 LAB — COMPREHENSIVE METABOLIC PANEL
ALT: 14 U/L (ref 0–44)
AST: 17 U/L (ref 15–41)
Albumin: 3.1 g/dL — ABNORMAL LOW (ref 3.5–5.0)
Alkaline Phosphatase: 55 U/L (ref 38–126)
Anion gap: 9 (ref 5–15)
BUN: 14 mg/dL (ref 8–23)
CO2: 36 mmol/L — ABNORMAL HIGH (ref 22–32)
Calcium: 8.8 mg/dL — ABNORMAL LOW (ref 8.9–10.3)
Chloride: 97 mmol/L — ABNORMAL LOW (ref 98–111)
Creatinine, Ser: 0.73 mg/dL (ref 0.61–1.24)
GFR, Estimated: >60 mL/min (ref >60)
Glucose, Bld: 151 mg/dL — ABNORMAL HIGH (ref 70–99)
Potassium: 3.9 mmol/L (ref 3.5–5.1)
Sodium: 142 mmol/L (ref 135–145)
Total Bilirubin: 0.8 mg/dL (ref 0.3–1.2)
Total Protein: 6.5 g/dL (ref 6.5–8.1)

## 2020-12-09 LAB — D-DIMER, QUANTITATIVE: D-Dimer, Quant: 0.62 ug/mL-FEU — ABNORMAL HIGH (ref 0.00–0.50)

## 2020-12-09 LAB — MAGNESIUM: Magnesium: 2.1 mg/dL (ref 1.7–2.4)

## 2020-12-09 LAB — CBC
HCT: 35.7 % — ABNORMAL LOW (ref 39.0–52.0)
Hemoglobin: 11.2 g/dL — ABNORMAL LOW (ref 13.0–17.0)
MCH: 28.6 pg (ref 26.0–34.0)
MCHC: 31.4 g/dL (ref 30.0–36.0)
MCV: 91.3 fL (ref 80.0–100.0)
Platelets: 255 10*3/uL (ref 150–400)
RBC: 3.91 MIL/uL — ABNORMAL LOW (ref 4.22–5.81)
RDW: 13.7 % (ref 11.5–15.5)
WBC: 8.8 10*3/uL (ref 4.0–10.5)
nRBC: 0 % (ref 0.0–0.2)

## 2020-12-09 LAB — PHOSPHORUS: Phosphorus: 3.1 mg/dL (ref 2.5–4.6)

## 2020-12-09 NOTE — Progress Notes (Signed)
PROGRESS NOTE  Jacob Pace Alfonzo ZDG:[id number] DOB: [birth year] DOA: 12/07/2020 [facility]CP: [facility]   LOS: 0 days   Brief Narrative / Interim history: 70 year old male with history of HTN, COPD on chronic 4 L at home, comes into the hospital with worsening shortness of breath.  This started 1 to 2 days prior to admission.  Apparently he ran out of his prednisone a few days prior and was not scheduled to see his pulmonologist up until next week.  Once he finished the prednisone he started feeling sick again with wheezing, profound shortness of breath and came back to the hospital.  In the ED he was flu/COVID-negative, chest x-ray showed mild cardiomegaly with mild central vascular congestion without focal consolidation.  He was placed on steroids, nebulizers, azithromycin and was admitted to the hospital.  Subjective / 24h Interval events: He continues to have significant dyspnea with minimal activity.  Just went to the bathroom and back and tells me he got pretty short of breath.  Assessment & Plan: Principal Problem COPD exacerbation, chronic hypoxic respiratory failure-remains quite symptomatic still, has mild end expiratory wheezing but when compared to prior notes he overall appears improved -Continue azithromycin, steroids, breathing treatments.  Continue I-S/flutter valve.  Received Lasix x1 on 11/8  Active Problems Essential hypertension-continue diltiazem  Lower extremity swelling-patient tells me his left leg has been more swollen over the past 2 weeks.  Obtain lower extremity ultrasound.  If negative, low threshold to screen with a D-dimer and potentially CT angiogram given his shortness of breath  Scheduled Meds:  azithromycin  250 mg Oral Daily   dextromethorphan-guaiFENesin  1 tablet Oral BID   diltiazem  180 mg Oral Daily   enoxaparin (LOVENOX) injection  40 mg Subcutaneous Q24H   folic acid  1 mg Oral Daily   ipratropium-albuterol  3 mL Nebulization Q6H    methylPREDNISolone (SOLU-MEDROL) injection  40 mg Intravenous Q12H   mometasone-formoterol  2 puff Inhalation BID   pantoprazole  40 mg Oral Daily   phosphorus  500 mg Oral BID   Continuous Infusions: PRN Meds:.ipratropium-albuterol  Diet Orders (From admission, onward)     Start     Ordered   12/08/20 0154  Diet Heart Room service appropriate? Yes; Fluid consistency: Thin  Diet effective now       Question Answer Comment  Room service appropriate? Yes   Fluid consistency: Thin      12/08/20 0155            DVT prophylaxis: enoxaparin (LOVENOX) injection 40 mg Start: 12/08/20 1000 SCDs Start: 12/08/20 0154     Code Status: Full Code  Family Communication: No family at bedside  Status is: Observation  The patient will require care spanning > 2 midnights and should be moved to inpatient because: Persistent symptoms, ruling out DVT/PE  Level of care: Med-Surg  Consultants:  none  Procedures:  none  Microbiology  none  Antimicrobials: Azithromycin    Objective: Vitals:   12/09/20 0340 12/09/20 0500 12/09/20 0748 12/09/20 0749  BP: 102/61     Pulse: 81     Resp: 20     Temp: 98.3 F (36.8 C)     TempSrc:      SpO2: 98%  95% 95%  Weight:  97.9 kg    Height:        Intake/Output Summary (Last 24 hours) at 12/09/2020 1215 Last data filed at 12/09/2020 0900 Gross per 24 hour  Intake 720 ml  Output --  Net 720 ml   Filed Weights   12/08/20 0001 12/08/20 1006 12/09/20 0500  Weight: 98.4 kg 98 kg 97.9 kg    Examination:  Constitutional: NAD Eyes: no scleral icterus ENMT: Mucous membranes are moist.  Neck: normal, supple Respiratory: Faint end expiratory wheezing, overall distant breath sounds.  Normal respiratory effort at rest but does utilize pursed lip breathing at times Cardiovascular: Regular rate and rhythm, no murmurs / rubs / gallops.  Trace-1+ lower extremity edema left greater than right Abdomen: non distended, no tenderness. Bowel  sounds positive.  Musculoskeletal: no clubbing / cyanosis.  Skin: no rashes Neurologic: Nonfocal, equal strength Psychiatric: Normal judgment and insight. Alert and oriented x 3. Normal mood.   Data Reviewed: I have independently reviewed following labs and imaging studies   CBC: Recent Labs  Lab 12/08/20 0000 12/09/20 0459  WBC 13.9* 8.8  NEUTROABS 9.7*  --   HGB 12.4* 11.2*  HCT 40.4 35.7*  MCV 94.6 91.3  PLT 230 255   Basic Metabolic Panel: Recent Labs  Lab 12/08/20 0000 12/08/20 0313 12/09/20 0459  NA 139  --  142  K 3.9  --  3.9  CL 100  --  97*  CO2 30  --  36*  GLUCOSE 113*  --  151*  BUN 8  --  14  CREATININE 0.72  --  0.73  CALCIUM 8.9  --  8.8*  MG  --  1.9 2.1  PHOS  --  2.3* 3.1   Liver Function Tests: Recent Labs  Lab 12/09/20 0459  AST 17  ALT 14  ALKPHOS 55  BILITOT 0.8  PROT 6.5  ALBUMIN 3.1*   Coagulation Profile: No results for input(s): INR, PROTIME in the last 168 hours. HbA1C: No results for input(s): HGBA1C in the last 72 hours. CBG: No results for input(s): GLUCAP in the last 168 hours.  Recent Results (from the past 240 hour(s))  Resp Panel by RT-PCR (Flu A&B, Covid) Nasopharyngeal Swab     Status: None   Collection Time: 12/08/20 12:04 AM   Specimen: Nasopharyngeal Swab; Nasopharyngeal(NP) swabs in vial transport medium  Result Value Ref Range Status   SARS Coronavirus 2 by RT PCR NEGATIVE NEGATIVE Final    Comment: (NOTE) SARS-CoV-2 target nucleic acids are NOT DETECTED.  The SARS-CoV-2 RNA is generally detectable in upper respiratory specimens during the acute phase of infection. The lowest concentration of SARS-CoV-2 viral copies this assay can detect is 138 copies/mL. A negative result does not preclude SARS-Cov-2 infection and should not be used as the sole basis for treatment or other patient management decisions. A negative result may occur with  improper specimen collection/handling, submission of specimen  other than nasopharyngeal swab, presence of viral mutation(s) within the areas targeted by this assay, and inadequate number of viral copies(<138 copies/mL). A negative result must be combined with clinical observations, patient history, and epidemiological information. The expected result is Negative.  Fact Sheet for Patients:  BloggerCourse.com  Fact Sheet for Healthcare Providers:  SeriousBroker.it  This test is no t yet approved or cleared by the [place] FDA and  has been authorized for detection and/or diagnosis of SARS-CoV-2 by FDA under an Emergency Use Authorization (EUA). This EUA will remain  in effect (meaning this test can be used) for the duration of the COVID-19 declaration under Section 564(b)(1) of the Act, 21 U.S.C.section 360bbb-3(b)(1), unless the authorization is terminated  or revoked sooner.       Influenza A by PCR  NEGATIVE NEGATIVE Final   Influenza B by PCR NEGATIVE NEGATIVE Final    Comment: (NOTE) The Xpert Xpress SARS-CoV-2/FLU/RSV plus assay is intended as an aid in the diagnosis of influenza from Nasopharyngeal swab specimens and should not be used as a sole basis for treatment. Nasal washings and aspirates are unacceptable for Xpert Xpress SARS-CoV-2/FLU/RSV testing.  Fact Sheet for Patients: EntrepreneurPulse.com.au  Fact Sheet for Healthcare Providers: IncredibleEmployment.be  This test is not yet approved or cleared by the [place] FDA and has been authorized for detection and/or diagnosis of SARS-CoV-2 by FDA under an Emergency Use Authorization (EUA). This EUA will remain in effect (meaning this test can be used) for the duration of the COVID-19 declaration under Section 564(b)(1) of the Act, 21 U.S.C. section 360bbb-3(b)(1), unless the authorization is terminated or revoked.  Performed at [facility], [address], [place], [place]  [address]      Radiology Studies: No results found.  Marzetta Board, MD, PhD [facility]  Between 7 am - 7 pm I am available, please contact me via Amion (for emergencies) or Securechat (non urgent messages)  Between 7 pm - 7 am I am not available, please contact night coverage MD/APP via Amion

## 2020-12-10 DIAGNOSIS — J441 Chronic obstructive pulmonary disease with (acute) exacerbation: Secondary | ICD-10-CM | POA: Diagnosis not present

## 2020-12-10 MED ORDER — PREDNISONE 10 MG PO TABS: ORAL_TABLET | ORAL | 0 refills | Status: AC

## 2020-12-10 MED ORDER — AZITHROMYCIN 250 MG PO TABS
250.0000 mg | ORAL_TABLET | Freq: Every day | ORAL | 0 refills | Status: AC
Start: 2020-12-10 — End: 2020-12-12

## 2020-12-10 NOTE — Discharge Summary (Signed)
Physician Discharge Summary  Jacob Pace [id number] DOB: September 22, 1950 DOA: 12/07/2020  PCP: [facility]  Admit date: 12/07/2020 Discharge date: 12/10/2020  Admitted From: home Disposition:  home  Recommendations for Outpatient Follow-up:  Follow up with Dr Lamonte Sakai in 8 days as scheduled  Home Health: none Equipment/Devices: home O2  Discharge Condition: stable CODE STATUS: Full code Diet recommendation: regulat  HPI: Per admitting MD, Jacob Pace is a 70 y.o. male with medical history significant for essential hypertension, chronic respiratory failure on supplemental oxygen at 4 LPM and COPD who presents to the emergency department due to worsening shortness of breath which started at home yesterday, he states that he tried home breathing treatments x4 without any relief, so he decided to activate EMS.  On arrival of EMS, IV Solu-Medrol 125 mg x 1 was given and breathing treatment with albuterol was given en route to the ED with only minimal improvement.  He denies chest pain, fever, cough, exposure to sick contacts. Patient was admitted from 9/22- 9/26 due to COPD exacerbation, at that time, he was placed on BiPAP prior to transitioning to supplemental oxygen via [place].  Hospital Course / Discharge diagnoses: Principal Problem COPD exacerbation, chronic hypoxic respiratory failure-patient was admitted to the hospital with worsening respiratory failure in the setting of running out of his prednisone a few days prior.  He has significant wheezing and was admitted to the hospital.  He was treated for COPD exacerbation with a short course of azithromycin, IV steroids as well as breathing treatments.  Improved, he is back to baseline and will be discharged home in stable condition.  He will be placed on a prednisone long taper which will cover by the time he sees Dr. Lamonte Sakai in office in 8 days.  Active Problems Essential hypertension-continue home medications Lower  extremity swelling-patient tells me his left leg has been more swollen over the past 2 weeks.  Lower extremity ultrasound negative for DVT, D-dimer not significantly elevated.  His BNP was normal.  This is likely due to chronic venous insufficiency, advised low-sodium diet, leg elevation, compression stockings.  Sepsis ruled out  Discharge Instructions   Allergies as of 12/10/2020   No Known Allergies      Medication List     TAKE these medications    albuterol 108 (90 Base) MCG/ACT inhaler Commonly known as: VENTOLIN HFA Inhale 1-2 puffs into the lungs every 6 (six) hours as needed for wheezing or shortness of breath.   albuterol (2.5 MG/3ML) 0.083% nebulizer solution Commonly known as: PROVENTIL Take 6 mLs (5 mg total) by nebulization every 4 (four) hours as needed for wheezing or shortness of breath.   azithromycin 250 MG tablet Commonly known as: ZITHROMAX Take 1 tablet (250 mg total) by mouth daily for 2 days.   budesonide-formoterol 160-4.5 MCG/ACT inhaler Commonly known as: SYMBICORT Inhale 2 puffs into the lungs 2 (two) times daily.   diltiazem 180 MG 24 hr capsule Commonly known as: CARDIZEM CD Take 1 capsule (180 mg total) by mouth daily.   folic acid 1 MG tablet Commonly known as: FOLVITE Take 1 tablet (1 mg total) by mouth daily.   predniSONE 10 MG tablet Commonly known as: DELTASONE Take 4 tablets (40 mg total) by mouth daily for 4 days, THEN 3 tablets (30 mg total) daily for 4 days, THEN 2 tablets (20 mg total) daily for 4 days, THEN 1 tablet (10 mg total) daily for 4 days. Start taking on: December 10, 2020 What changed: See the new instructions.   tiotropium 18 MCG inhalation capsule Commonly known as: SPIRIVA Place 1 capsule (18 mcg total) into inhaler and inhale daily.       Consultations: none  Procedures/Studies:  US Venous Img Lower Bilateral (DVT)  Result Date: 12/09/2020 CLINICAL DATA:  Short of breath EXAM: BILATERAL LOWER EXTREMITY  VENOUS DOPPLER ULTRASOUND TECHNIQUE: Gray-scale sonography with graded compression, as well as color Doppler and duplex ultrasound were performed to evaluate the lower extremity deep venous systems from the level of the common femoral vein and including the common femoral, femoral, profunda femoral, popliteal and calf veins including the posterior tibial, peroneal and gastrocnemius veins when visible. The superficial great saphenous vein was also interrogated. Spectral Doppler was utilized to evaluate flow at rest and with distal augmentation maneuvers in the common femoral, femoral and popliteal veins. COMPARISON:  None. FINDINGS: RIGHT LOWER EXTREMITY Common Femoral Vein: No evidence of thrombus. Normal compressibility, respiratory phasicity and response to augmentation. Saphenofemoral Junction: No evidence of thrombus. Normal compressibility and flow on color Doppler imaging. Profunda Femoral Vein: No evidence of thrombus. Normal compressibility and flow on color Doppler imaging. Femoral Vein: No evidence of thrombus. Normal compressibility, respiratory phasicity and response to augmentation. Popliteal Vein: No evidence of thrombus. Normal compressibility, respiratory phasicity and response to augmentation. Calf Veins: No evidence of thrombus. Normal compressibility and flow on color Doppler imaging. Superficial Great Saphenous Vein: No evidence of thrombus. Normal compressibility. Venous Reflux:  None. Other Findings:  None. LEFT LOWER EXTREMITY Common Femoral Vein: No evidence of thrombus. Normal compressibility, respiratory phasicity and response to augmentation. Saphenofemoral Junction: No evidence of thrombus. Normal compressibility and flow on color Doppler imaging. Profunda Femoral Vein: No evidence of thrombus. Normal compressibility and flow on color Doppler imaging. Femoral Vein: No evidence of thrombus. Normal compressibility, respiratory phasicity and response to augmentation. Popliteal Vein: No  evidence of thrombus. Normal compressibility, respiratory phasicity and response to augmentation. Calf Veins: No evidence of thrombus. Normal compressibility and flow on color Doppler imaging. Superficial Great Saphenous Vein: No evidence of thrombus. Normal compressibility. Venous Reflux:  None. Other Findings:  None. IMPRESSION: No evidence of deep venous thrombosis in either lower extremity. Electronically Signed   By: Malachy Moan M.D.   On: 12/09/2020 14:42   DG Chest Port 1 View  Result Date: 12/08/2020 CLINICAL DATA:  Shortness of breath. EXAM: PORTABLE CHEST 1 VIEW COMPARISON:  Chest radiograph dated 10/21/2020. FINDINGS: Mild cardiomegaly with mild central vascular congestion. No focal consolidation, pleural effusion, or pneumothorax. Background of emphysema. Atherosclerotic calcification of the aorta. No acute osseous pathology. IMPRESSION: Mild cardiomegaly with mild central vascular congestion. No focal consolidation. Electronically Signed   By: Elgie Collard M.D.   On: 12/08/2020 00:42     Subjective: - no chest pain, shortness of breath, no abdominal pain, nausea or vomiting.   Discharge Exam: BP 122/72 (BP Location: Right Arm)   Pulse 85   Temp 97.8 F (36.6 C) (Oral)   Resp 19   Ht 6\' 2"  (1.88 m)   Wt 97.4 kg   SpO2 97%   BMI 27.57 kg/m   General: Pt is alert, awake, not in acute distress Cardiovascular: RRR, S1/S2 +, no rubs, no gallops Respiratory: CTA bilaterally, no wheezing, no rhonchi Abdominal: Soft, NT, ND, bowel sounds + Extremities: no edema, no cyanosis  The results of significant diagnostics from this hospitalization (including imaging, microbiology, ancillary and laboratory) are listed below for reference.     Microbiology:  Recent Results (from the past 240 hour(s))  Resp Panel by RT-PCR (Flu A&B, Covid) Nasopharyngeal Swab     Status: None   Collection Time: 12/08/20 12:04 AM   Specimen: Nasopharyngeal Swab; Nasopharyngeal(NP) swabs in vial  transport medium  Result Value Ref Range Status   SARS Coronavirus 2 by RT PCR NEGATIVE NEGATIVE Final    Comment: (NOTE) SARS-CoV-2 target nucleic acids are NOT DETECTED.  The SARS-CoV-2 RNA is generally detectable in upper respiratory specimens during the acute phase of infection. The lowest concentration of SARS-CoV-2 viral copies this assay can detect is 138 copies/mL. A negative result does not preclude SARS-Cov-2 infection and should not be used as the sole basis for treatment or other patient management decisions. A negative result may occur with  improper specimen collection/handling, submission of specimen other than nasopharyngeal swab, presence of viral mutation(s) within the areas targeted by this assay, and inadequate number of viral copies(<138 copies/mL). A negative result must be combined with clinical observations, patient history, and epidemiological information. The expected result is Negative.  Fact Sheet for Patients:  BloggerCourse.com  Fact Sheet for Healthcare Providers:  SeriousBroker.it  This test is no t yet approved or cleared by the [place] FDA and  has been authorized for detection and/or diagnosis of SARS-CoV-2 by FDA under an Emergency Use Authorization (EUA). This EUA will remain  in effect (meaning this test can be used) for the duration of the COVID-19 declaration under Section 564(b)(1) of the Act, 21 U.S.C.section 360bbb-3(b)(1), unless the authorization is terminated  or revoked sooner.       Influenza A by PCR NEGATIVE NEGATIVE Final   Influenza B by PCR NEGATIVE NEGATIVE Final    Comment: (NOTE) The Xpert Xpress SARS-CoV-2/FLU/RSV plus assay is intended as an aid in the diagnosis of influenza from Nasopharyngeal swab specimens and should not be used as a sole basis for treatment. Nasal washings and aspirates are unacceptable for Xpert Xpress SARS-CoV-2/FLU/RSV testing.  Fact  Sheet for Patients: BloggerCourse.com  Fact Sheet for Healthcare Providers: SeriousBroker.it  This test is not yet approved or cleared by the [place] FDA and has been authorized for detection and/or diagnosis of SARS-CoV-2 by FDA under an Emergency Use Authorization (EUA). This EUA will remain in effect (meaning this test can be used) for the duration of the COVID-19 declaration under Section 564(b)(1) of the Act, 21 U.S.C. section 360bbb-3(b)(1), unless the authorization is terminated or revoked.  Performed at [facility], [address], [place], [place] [address]      Labs: Basic Metabolic Panel: Recent Labs  Lab 12/08/20 0000 12/08/20 0313 12/09/20 0459  NA 139  --  142  K 3.9  --  3.9  CL 100  --  97*  CO2 30  --  36*  GLUCOSE 113*  --  151*  BUN 8  --  14  CREATININE 0.72  --  0.73  CALCIUM 8.9  --  8.8*  MG  --  1.9 2.1  PHOS  --  2.3* 3.1   Liver Function Tests: Recent Labs  Lab 12/09/20 0459  AST 17  ALT 14  ALKPHOS 55  BILITOT 0.8  PROT 6.5  ALBUMIN 3.1*   CBC: Recent Labs  Lab 12/08/20 0000 12/09/20 0459  WBC 13.9* 8.8  NEUTROABS 9.7*  --   HGB 12.4* 11.2*  HCT 40.4 35.7*  MCV 94.6 91.3  PLT 230 255   CBG: No results for input(s): GLUCAP in the last 168 hours. Hgb A1c  No results for input(s): HGBA1C in the last 72 hours. Lipid Profile No results for input(s): CHOL, HDL, LDLCALC, TRIG, CHOLHDL, LDLDIRECT in the last 72 hours. Thyroid function studies No results for input(s): TSH, T4TOTAL, T3FREE, THYROIDAB in the last 72 hours.  Invalid input(s): FREET3 Urinalysis No results found for: COLORURINE, APPEARANCEUR, LABSPEC, PHURINE, GLUCOSEU, HGBUR, BILIRUBINUR, KETONESUR, PROTEINUR, UROBILINOGEN, NITRITE, LEUKOCYTESUR  FURTHER DISCHARGE INSTRUCTIONS:   Get Medicines reviewed and adjusted: Please take all your medications with you for your next visit with your Primary MD    Laboratory/radiological data: Please request your Primary MD to go over all hospital tests and procedure/radiological results at the follow up, please ask your Primary MD to get all Hospital records sent to his/her office.   In some cases, they will be blood work, cultures and biopsy results pending at the time of your discharge. Please request that your primary care M.D. goes through all the records of your hospital data and follows up on these results.   Also Note the following: If you experience worsening of your admission symptoms, develop shortness of breath, life threatening emergency, suicidal or homicidal thoughts you must seek medical attention immediately by calling 911 or calling your MD immediately  if symptoms less severe.   You must read complete instructions/literature along with all the possible adverse reactions/side effects for all the Medicines you take and that have been prescribed to you. Take any new Medicines after you have completely understood and accpet all the possible adverse reactions/side effects.    Do not drive when taking Pain medications or sleeping medications (Benzodaizepines)   Do not take more than prescribed Pain, Sleep and Anxiety Medications. It is not advisable to combine anxiety,sleep and pain medications without talking with your primary care practitioner   Special Instructions: If you have smoked or chewed Tobacco  in the last 2 yrs please stop smoking, stop any regular Alcohol  and or any Recreational drug use.   Wear Seat belts while driving.   Please note: You were cared for by a hospitalist during your hospital stay. Once you are discharged, your primary care physician will handle any further medical issues. Please note that NO REFILLS for any discharge medications will be authorized once you are discharged, as it is imperative that you return to your primary care physician (or establish a relationship with a primary care physician if you do not  have one) for your post hospital discharge needs so that they can reassess your need for medications and monitor your lab values.  Time coordinating discharge: 40 minutes  SIGNED:  Marzetta Board, MD, PhD 12/10/2020, 7:36 AM

## 2020-12-10 NOTE — Progress Notes (Signed)
Pt discharged and taken downstairs via wheelchair and O2 at 4L by this nurse. Pt was able to switch to his O2 that was brought from home by family.

## 2020-12-18 ENCOUNTER — Encounter: Payer: Self-pay | Admitting: Emergency Medicine

## 2020-12-18 ENCOUNTER — Ambulatory Visit (INDEPENDENT_AMBULATORY_CARE_PROVIDER_SITE_OTHER): Payer: No Typology Code available for payment source | Admitting: Emergency Medicine

## 2020-12-18 ENCOUNTER — Other Ambulatory Visit: Payer: Self-pay

## 2020-12-18 DIAGNOSIS — J9611 Chronic respiratory failure with hypoxia: Secondary | ICD-10-CM

## 2020-12-18 DIAGNOSIS — J449 Chronic obstructive pulmonary disease, unspecified: Secondary | ICD-10-CM

## 2020-12-18 DIAGNOSIS — Z9981 Dependence on supplemental oxygen: Secondary | ICD-10-CM

## 2020-12-18 MED ORDER — PREDNISONE 10 MG PO TABS: 20.0000 mg | ORAL_TABLET | Freq: Every day | ORAL | 5 refills | Status: DC

## 2020-12-18 NOTE — Addendum Note (Signed)
Addended by: Dorisann Frames R on: 12/18/2020 01:54 PM   Modules accepted: Orders

## 2020-12-18 NOTE — Assessment & Plan Note (Signed)
Continue 4 L/min at all times. 

## 2020-12-18 NOTE — Patient Instructions (Signed)
Please continue your Symbicort and Spiriva as you have been taking them.  Rinse and gargle after the Symbicort. Keep your albuterol available to use either 1 nebulizer treatment or 2 puffs up to every 4 hours if needed for shortness of breath, chest tightness, wheezing. After your taper from the hospital is completed you should continue your prednisone 20 mg daily.  We will ensure that you have adequate refills on this medicine Continue your oxygen at 4 L/min at all times. Flu shot up-to-date COVID-19 vaccine is up-to-date Follow with Dr Delton Coombes in 6 months or sooner if you have any problems

## 2020-12-18 NOTE — Progress Notes (Signed)
Subjective:    Patient ID: Jacob Pace, male    DOB: [birth year], 70 y.o.   MRN: [id number]  HPI  ROV 06/12/20 --70 year old man, former smoker (50 pack years).  Have seen him in the past for emphysematous COPD with very severe obstruction and decreased diffusion capacity documented by PFT in 2012.  He has associated hypoxemic respiratory failure and uses oxygen at 4 L/min.  Also with a history of stable pulmonary nodular disease that was followed with serial CT scans.  It looks like he was admitted for acute exacerbations in January and then late March. On each occasion he had severe dyspnea, inability to walk across the room.  He has rare cough, prod of yellow. No PND.  We have been managing him on Spiriva and Symbicort - he is on wixella, changed by [place]. He feels this corresponds with when he started to have more trouble.   ROV 10/28/20 --Jacob Pace is 70, former smoker with emphysematous COPD and very severe obstruction.  He has associated hypoxemic respiratory failure on 4 L/min.  He has been experiencing frequent exacerbations.  I last saw him in May 2022 and since then he was hospitalized in August and then again this month.  I tried changing him to [facility] with a spacer to see if he would get more benefit.  He reports that his breathing is improved compared with recent hospitalization (just discharged). He can be a bit more active w his O2 on.   R OV 12/18/2020 --follow-up visit for 70 year old gentleman with a history of former tobacco use and emphysematous COPD, very severe obstruction.  He has hypoxemic respiratory failure and is on 4 L/min.  He deals with frequent exacerbations and states that he was in the hospital earlier this month with a flare - happened after he ran out of his prednisone.  He was discharged a week ago on a prednisone taper.  Has been managed on Symbicort and Spiriva, scheduled prednisone, most recently 20 mg daily.  Also scheduled daily azithromycin. He is using  albuterol 2-3x a day. Flu shot, COVID shots up to date.    Review of Systems  Constitutional:  Negative for fever and unexpected weight change.  HENT:  Negative for congestion, dental problem, ear pain, nosebleeds, postnasal drip, rhinorrhea, sinus pressure, sneezing, sore throat and trouble swallowing.   Eyes:  Negative for redness and itching.  Respiratory:  Positive for shortness of breath and wheezing. Negative for cough and chest tightness.   Cardiovascular:  Negative for palpitations and leg swelling.  Gastrointestinal:  Negative for nausea and vomiting.  Genitourinary:  Negative for dysuria.  Musculoskeletal:  Negative for joint swelling.  Skin:  Negative for rash.  Neurological:  Negative for headaches.  Hematological:  Does not bruise/bleed easily.  Psychiatric/Behavioral:  Negative for dysphoric mood. The patient is not nervous/anxious.    Past Medical History:  Diagnosis Date   COPD (chronic obstructive pulmonary disease) (HCC)    Emphysema    Hypertension      Family History  Problem Relation Age of Onset   Cancer Father        unsure what type   Breast cancer Sister      Social History   Socioeconomic History   Marital status: Legally Separated    Spouse name: Not on file   Number of children: Not on file   Years of education: Not on file   Highest education level: Not on file  Occupational History   Occupation:  textile plant  Tobacco Use   Smoking status: Former    Packs/day: 1.00    Years: 40.00    Pack years: 40.00    Types: Cigarettes    Quit date: 02/01/2008    Years since quitting: 12.8   Smokeless tobacco: Never  Vaping Use   Vaping Use: Never used  Substance and Sexual Activity   Alcohol use: Yes    Comment: weekends   Drug use: No   Sexual activity: Never  Other Topics Concern   Not on file  Social History Narrative   seperated with children         Social Determinants of Health   Financial Resource Strain: Not on file  Food  Insecurity: Not on file  Transportation Needs: Not on file  Physical Activity: Not on file  Stress: Not on file  Social Connections: Not on file  Intimate Partner Violence: Not on file   He was in the [employer], was in [place], [place] until 1982, probable asbestos exposure. ? Agent Orange.  Has lived in [place], [place], [place].   No Known Allergies   Outpatient Medications Prior to Visit  Medication Sig Dispense Refill   albuterol (PROVENTIL) (2.5 MG/3ML) 0.083% nebulizer solution Take 6 mLs (5 mg total) by nebulization every 4 (four) hours as needed for wheezing or shortness of breath. 75 mL 5   albuterol (VENTOLIN HFA) 108 (90 Base) MCG/ACT inhaler Inhale 1-2 puffs into the lungs every 6 (six) hours as needed for wheezing or shortness of breath.     budesonide-formoterol (SYMBICORT) 160-4.5 MCG/ACT inhaler Inhale 2 puffs into the lungs 2 (two) times daily. 1 each 12   diltiazem (CARDIZEM CD) 180 MG 24 hr capsule Take 1 capsule (180 mg total) by mouth daily. 30 capsule 5   folic acid (FOLVITE) 1 MG tablet Take 1 tablet (1 mg total) by mouth daily. 30 tablet 3   predniSONE (DELTASONE) 10 MG tablet Take 4 tablets (40 mg total) by mouth daily for 4 days, THEN 3 tablets (30 mg total) daily for 4 days, THEN 2 tablets (20 mg total) daily for 4 days, THEN 1 tablet (10 mg total) daily for 4 days. 40 tablet 0   tiotropium (SPIRIVA) 18 MCG inhalation capsule Place 1 capsule (18 mcg total) into inhaler and inhale daily. 30 capsule 5   No facility-administered medications prior to visit.        Objective:   Physical Exam  Vitals:   12/18/20 1155  BP: 116/70  Pulse: 77  Temp: (!) 97.5 F (36.4 C)  TempSrc: Oral  SpO2: 98%  Weight: 217 lb (98.4 kg)  Height: 6\' 2"  (1.88 m)   Gen: Pleasant, elderly man, in no distress,  normal affect  ENT: No lesions,  mouth clear,  oropharynx clear, no postnasal drip  Neck: No JVD, no stridor  Lungs: No use of accessory muscles, distant, no crackles or wheezing  on normal respiration, no wheeze on forced expiration  Cardiovascular: RRR, heart sounds normal, no murmur or gallops, no peripheral edema  Musculoskeletal: No deformities, no cyanosis or clubbing  Neuro: alert, awake, non focal  Skin: Warm, no lesions or rash      Assessment & Plan:  Chronic obstructive pulmonary disease (HCC) Very severe COPD with a flare when he inadvertently ran out of his prednisone.  He was only off of it for 2 days and ended up in the hospital.  I do not think he will ever wean off completely.  He  is on 20 mg daily and we may be able to someday get down to a lower dose but unclear.  He is tolerating the azithromycin, unclear how much it has helped but we will continue it.  Continue his usual regimen.  Vaccines are up-to-date.   Chronic respiratory failure with hypoxia, on home O2 therapy (HCC) Continue 4 L/min at all times.   Levy Pupa, MD, PhD 12/18/2020, 12:20 PM [facility] [phone] or if no answer [phone]

## 2020-12-18 NOTE — Assessment & Plan Note (Signed)
Very severe COPD with a flare when he inadvertently ran out of his prednisone.  He was only off of it for 2 days and ended up in the hospital.  I do not think he will ever wean off completely.  He is on 20 mg daily and we may be able to someday get down to a lower dose but unclear.  He is tolerating the azithromycin, unclear how much it has helped but we will continue it.  Continue his usual regimen.  Vaccines are up-to-date.

## 2021-03-19 ENCOUNTER — Other Ambulatory Visit: Payer: Self-pay | Admitting: Emergency Medicine

## 2021-03-20 ENCOUNTER — Other Ambulatory Visit: Payer: Self-pay | Admitting: Emergency Medicine

## 2021-03-22 ENCOUNTER — Other Ambulatory Visit: Payer: Self-pay | Admitting: Emergency Medicine

## 2021-03-23 ENCOUNTER — Other Ambulatory Visit: Payer: Self-pay | Admitting: Emergency Medicine

## 2021-05-02 ENCOUNTER — Encounter (HOSPITAL_COMMUNITY): Payer: Self-pay

## 2021-05-02 ENCOUNTER — Inpatient Hospital Stay (HOSPITAL_COMMUNITY)
Admission: EM | Admit: 2021-05-02 | Discharge: 2021-05-04 | DRG: 192 | Disposition: A | Discharge status: Home / Self Care | Payer: No Typology Code available for payment source | Attending: Family Medicine | Admitting: Family Medicine

## 2021-05-02 ENCOUNTER — Emergency Department (HOSPITAL_COMMUNITY): Payer: No Typology Code available for payment source

## 2021-05-02 ENCOUNTER — Other Ambulatory Visit: Payer: Self-pay

## 2021-05-02 DIAGNOSIS — Z515 Encounter for palliative care: Secondary | ICD-10-CM | POA: Diagnosis not present

## 2021-05-02 DIAGNOSIS — Z87891 Personal history of nicotine dependence: Secondary | ICD-10-CM | POA: Diagnosis not present

## 2021-05-02 DIAGNOSIS — T380X5A Adverse effect of glucocorticoids and synthetic analogues, initial encounter: Secondary | ICD-10-CM | POA: Diagnosis present

## 2021-05-02 DIAGNOSIS — J441 Chronic obstructive pulmonary disease with (acute) exacerbation: Secondary | ICD-10-CM | POA: Diagnosis present

## 2021-05-02 DIAGNOSIS — I10 Essential (primary) hypertension: Secondary | ICD-10-CM | POA: Diagnosis present

## 2021-05-02 DIAGNOSIS — J438 Other emphysema: Secondary | ICD-10-CM | POA: Diagnosis not present

## 2021-05-02 DIAGNOSIS — J439 Emphysema, unspecified: Secondary | ICD-10-CM | POA: Diagnosis present

## 2021-05-02 DIAGNOSIS — Z7952 Long term (current) use of systemic steroids: Secondary | ICD-10-CM

## 2021-05-02 DIAGNOSIS — W19XXXA Unspecified fall, initial encounter: Secondary | ICD-10-CM | POA: Diagnosis present

## 2021-05-02 DIAGNOSIS — J9621 Acute and chronic respiratory failure with hypoxia: Secondary | ICD-10-CM | POA: Diagnosis not present

## 2021-05-02 DIAGNOSIS — J9622 Acute and chronic respiratory failure with hypercapnia: Secondary | ICD-10-CM | POA: Diagnosis not present

## 2021-05-02 DIAGNOSIS — N179 Acute kidney failure, unspecified: Secondary | ICD-10-CM | POA: Diagnosis not present

## 2021-05-02 DIAGNOSIS — Z79899 Other long term (current) drug therapy: Secondary | ICD-10-CM | POA: Diagnosis not present

## 2021-05-02 DIAGNOSIS — Z66 Do not resuscitate: Secondary | ICD-10-CM | POA: Diagnosis present

## 2021-05-02 DIAGNOSIS — Y92009 Unspecified place in unspecified non-institutional (private) residence as the place of occurrence of the external cause: Secondary | ICD-10-CM

## 2021-05-02 DIAGNOSIS — Z7951 Long term (current) use of inhaled steroids: Secondary | ICD-10-CM

## 2021-05-02 DIAGNOSIS — I471 Supraventricular tachycardia: Secondary | ICD-10-CM | POA: Diagnosis not present

## 2021-05-02 DIAGNOSIS — D72829 Elevated white blood cell count, unspecified: Secondary | ICD-10-CM | POA: Diagnosis present

## 2021-05-02 DIAGNOSIS — Z20822 Contact with and (suspected) exposure to covid-19: Secondary | ICD-10-CM | POA: Diagnosis present

## 2021-05-02 DIAGNOSIS — I480 Paroxysmal atrial fibrillation: Secondary | ICD-10-CM | POA: Diagnosis not present

## 2021-05-02 DIAGNOSIS — Y929 Unspecified place or not applicable: Secondary | ICD-10-CM

## 2021-05-02 LAB — BLOOD GAS, VENOUS
Acid-Base Excess: 12.1 mmol/L — ABNORMAL HIGH (ref 0.0–2.0)
Bicarbonate: 40 mmol/L — ABNORMAL HIGH (ref 20.0–28.0)
Drawn by: 5212
FIO2: 60 %
O2 Saturation: 74.3 %
Patient temperature: 36.4
pCO2, Ven: 64 mmHg — ABNORMAL HIGH (ref 44–60)
pH, Ven: 7.4 (ref 7.25–7.43)
pO2, Ven: 40 mmHg (ref 32–45)

## 2021-05-02 LAB — CBC WITH DIFFERENTIAL/PLATELET
Abs Immature Granulocytes: 0.18 10*3/uL — ABNORMAL HIGH (ref 0.00–0.07)
Basophils Absolute: 0 10*3/uL (ref 0.0–0.1)
Basophils Relative: 0 %
Eosinophils Absolute: 0 10*3/uL (ref 0.0–0.5)
Eosinophils Relative: 0 %
HCT: 44.6 % (ref 39.0–52.0)
Hemoglobin: 13 g/dL (ref 13.0–17.0)
Immature Granulocytes: 1 %
Lymphocytes Relative: 2 %
Lymphs Abs: 0.2 10*3/uL — ABNORMAL LOW (ref 0.7–4.0)
MCH: 27.5 pg (ref 26.0–34.0)
MCHC: 29.1 g/dL — ABNORMAL LOW (ref 30.0–36.0)
MCV: 94.3 fL (ref 80.0–100.0)
Monocytes Absolute: 0.1 10*3/uL (ref 0.1–1.0)
Monocytes Relative: 1 %
Neutro Abs: 13.6 10*3/uL — ABNORMAL HIGH (ref 1.7–7.7)
Neutrophils Relative %: 96 %
Platelets: 265 10*3/uL (ref 150–400)
RBC: 4.73 MIL/uL (ref 4.22–5.81)
RDW: 14.3 % (ref 11.5–15.5)
WBC: 14.1 10*3/uL — ABNORMAL HIGH (ref 4.0–10.5)
nRBC: 0 % (ref 0.0–0.2)

## 2021-05-02 LAB — BASIC METABOLIC PANEL
Anion gap: 8 (ref 5–15)
BUN: 18 mg/dL (ref 8–23)
CO2: 35 mmol/L — ABNORMAL HIGH (ref 22–32)
Calcium: 9.3 mg/dL (ref 8.9–10.3)
Chloride: 98 mmol/L (ref 98–111)
Creatinine, Ser: 0.94 mg/dL (ref 0.61–1.24)
GFR, Estimated: >60 mL/min (ref >60)
Glucose, Bld: 101 mg/dL — ABNORMAL HIGH (ref 70–99)
Potassium: 4.2 mmol/L (ref 3.5–5.1)
Sodium: 141 mmol/L (ref 135–145)

## 2021-05-02 LAB — COMPREHENSIVE METABOLIC PANEL
ALT: 18 U/L (ref 0–44)
AST: 23 U/L (ref 15–41)
Albumin: 3.5 g/dL (ref 3.5–5.0)
Alkaline Phosphatase: 52 U/L (ref 38–126)
Anion gap: 10 (ref 5–15)
BUN: 23 mg/dL (ref 8–23)
CO2: 31 mmol/L (ref 22–32)
Calcium: 9.1 mg/dL (ref 8.9–10.3)
Chloride: 100 mmol/L (ref 98–111)
Creatinine, Ser: 0.87 mg/dL (ref 0.61–1.24)
GFR, Estimated: >60 mL/min (ref >60)
Glucose, Bld: 158 mg/dL — ABNORMAL HIGH (ref 70–99)
Potassium: 4.3 mmol/L (ref 3.5–5.1)
Sodium: 141 mmol/L (ref 135–145)
Total Bilirubin: 0.5 mg/dL (ref 0.3–1.2)
Total Protein: 6.7 g/dL (ref 6.5–8.1)

## 2021-05-02 LAB — CBC
HCT: 45.7 % (ref 39.0–52.0)
Hemoglobin: 13.9 g/dL (ref 13.0–17.0)
MCH: 28.2 pg (ref 26.0–34.0)
MCHC: 30.4 g/dL (ref 30.0–36.0)
MCV: 92.7 fL (ref 80.0–100.0)
Platelets: 258 10*3/uL (ref 150–400)
RBC: 4.93 MIL/uL (ref 4.22–5.81)
RDW: 14.3 % (ref 11.5–15.5)
WBC: 15.5 10*3/uL — ABNORMAL HIGH (ref 4.0–10.5)
nRBC: 0 % (ref 0.0–0.2)

## 2021-05-02 LAB — RESP PANEL BY RT-PCR (FLU A&B, COVID) ARPGX2
Influenza A by PCR: NEGATIVE
Influenza B by PCR: NEGATIVE
SARS Coronavirus 2 by RT PCR: NEGATIVE

## 2021-05-02 LAB — MRSA NEXT GEN BY PCR, NASAL: MRSA by PCR Next Gen: NOT DETECTED

## 2021-05-02 LAB — PROCALCITONIN: Procalcitonin: <0.1 ng/mL

## 2021-05-02 LAB — MAGNESIUM: Magnesium: 2.1 mg/dL (ref 1.7–2.4)

## 2021-05-02 LAB — TROPONIN I (HIGH SENSITIVITY)
Troponin I (High Sensitivity): 7 ng/L (ref <18)
Troponin I (High Sensitivity): 7 ng/L (ref <18)

## 2021-05-02 MED ORDER — IPRATROPIUM-ALBUTEROL 0.5-2.5 (3) MG/3ML IN SOLN: 3.0000 mL | Freq: Once | RESPIRATORY_TRACT | Status: DC

## 2021-05-02 MED ORDER — METHYLPREDNISOLONE SODIUM SUCC 125 MG IJ SOLR
125.0000 mg | Freq: Every day | INTRAMUSCULAR | Status: AC
  Administered 2021-05-02: 125 mg via INTRAVENOUS
  Filled 2021-05-02: qty 2

## 2021-05-02 MED ORDER — MOMETASONE FURO-FORMOTEROL FUM 200-5 MCG/ACT IN AERO
2.0000 | INHALATION_SPRAY | Freq: Two times a day (BID) | RESPIRATORY_TRACT | Status: DC
  Administered 2021-05-02 – 2021-05-04 (×5): 2 via RESPIRATORY_TRACT
  Filled 2021-05-02: qty 8.8

## 2021-05-02 MED ORDER — SODIUM CHLORIDE 0.9 % IV SOLN
500.0000 mg | INTRAVENOUS | Status: AC
  Administered 2021-05-02: 500 mg via INTRAVENOUS
  Filled 2021-05-02: qty 5

## 2021-05-02 MED ORDER — OXYCODONE HCL 5 MG PO TABS: 5.0000 mg | ORAL_TABLET | ORAL | Status: DC | PRN

## 2021-05-02 MED ORDER — IPRATROPIUM-ALBUTEROL 0.5-2.5 (3) MG/3ML IN SOLN
3.0000 mL | Freq: Four times a day (QID) | RESPIRATORY_TRACT | Status: DC
  Administered 2021-05-02 – 2021-05-04 (×9): 3 mL via RESPIRATORY_TRACT
  Filled 2021-05-02 (×9): qty 3

## 2021-05-02 MED ORDER — DILTIAZEM HCL ER COATED BEADS 180 MG PO CP24
180.0000 mg | ORAL_CAPSULE | Freq: Every day | ORAL | Status: DC
  Administered 2021-05-02 – 2021-05-04 (×3): 180 mg via ORAL
  Filled 2021-05-02 (×3): qty 1

## 2021-05-02 MED ORDER — ACETAMINOPHEN 650 MG RE SUPP: 650.0000 mg | Freq: Four times a day (QID) | RECTAL | Status: DC | PRN

## 2021-05-02 MED ORDER — ACETAMINOPHEN 325 MG PO TABS
650.0000 mg | ORAL_TABLET | Freq: Four times a day (QID) | ORAL | Status: DC | PRN
  Administered 2021-05-02 – 2021-05-04 (×4): 650 mg via ORAL
  Filled 2021-05-02 (×4): qty 2

## 2021-05-02 MED ORDER — ORAL CARE MOUTH RINSE
15.0000 mL | Freq: Two times a day (BID) | OROMUCOSAL | Status: DC
  Administered 2021-05-02 – 2021-05-04 (×5): 15 mL via OROMUCOSAL

## 2021-05-02 MED ORDER — LORAZEPAM 2 MG/ML IJ SOLN
1.0000 mg | Freq: Once | INTRAMUSCULAR | Status: AC | PRN
  Administered 2021-05-02: 1 mg via INTRAVENOUS
  Filled 2021-05-02: qty 1

## 2021-05-02 MED ORDER — ONDANSETRON HCL 4 MG/2ML IJ SOLN: 4.0000 mg | Freq: Four times a day (QID) | INTRAMUSCULAR | Status: DC | PRN

## 2021-05-02 MED ORDER — ALBUTEROL SULFATE (2.5 MG/3ML) 0.083% IN NEBU
INHALATION_SOLUTION | RESPIRATORY_TRACT | Status: AC
  Administered 2021-05-02: 15 mg
  Filled 2021-05-02: qty 18

## 2021-05-02 MED ORDER — ONDANSETRON HCL 4 MG PO TABS: 4.0000 mg | ORAL_TABLET | Freq: Four times a day (QID) | ORAL | Status: DC | PRN

## 2021-05-02 MED ORDER — IPRATROPIUM BROMIDE 0.02 % IN SOLN
RESPIRATORY_TRACT | Status: AC
  Administered 2021-05-02: 0.5 mg
  Filled 2021-05-02: qty 2.5

## 2021-05-02 MED ORDER — HEPARIN SODIUM (PORCINE) 5000 UNIT/ML IJ SOLN
5000.0000 [IU] | Freq: Three times a day (TID) | INTRAMUSCULAR | Status: DC
  Administered 2021-05-02 – 2021-05-04 (×7): 5000 [IU] via SUBCUTANEOUS
  Filled 2021-05-02 (×8): qty 1

## 2021-05-02 MED ORDER — LORAZEPAM 2 MG/ML IJ SOLN: 0.5000 mg | Freq: Four times a day (QID) | INTRAMUSCULAR | Status: DC | PRN

## 2021-05-02 MED ORDER — AZITHROMYCIN 250 MG PO TABS
500.0000 mg | ORAL_TABLET | Freq: Every day | ORAL | Status: DC
  Administered 2021-05-03 – 2021-05-04 (×2): 500 mg via ORAL
  Filled 2021-05-02 (×2): qty 2

## 2021-05-02 MED ORDER — PREDNISONE 20 MG PO TABS
40.0000 mg | ORAL_TABLET | Freq: Every day | ORAL | Status: DC
  Administered 2021-05-03 – 2021-05-04 (×2): 40 mg via ORAL
  Filled 2021-05-02 (×2): qty 2

## 2021-05-02 MED ORDER — ASPIRIN EC 81 MG PO TBEC
81.0000 mg | DELAYED_RELEASE_TABLET | Freq: Every day | ORAL | Status: DC
  Administered 2021-05-02 – 2021-05-04 (×3): 81 mg via ORAL
  Filled 2021-05-02 (×3): qty 1

## 2021-05-02 MED ORDER — ALBUTEROL SULFATE (2.5 MG/3ML) 0.083% IN NEBU: 2.5000 mg | INHALATION_SOLUTION | RESPIRATORY_TRACT | Status: DC | PRN

## 2021-05-02 MED ORDER — FOLIC ACID 1 MG PO TABS
1.0000 mg | ORAL_TABLET | Freq: Every day | ORAL | Status: DC
  Administered 2021-05-02 – 2021-05-04 (×3): 1 mg via ORAL
  Filled 2021-05-02 (×3): qty 1

## 2021-05-02 NOTE — Assessment & Plan Note (Addendum)
Continue diltiazem BP control and follow ?

## 2021-05-02 NOTE — Hospital Course (Addendum)
71 y.o. male with medical history significant of with history of COPD, hypertension, folic acid deficiency, presents the ED with a chief complaint of dyspnea.  Patient reports it started earlier in the day and it was progressively worse throughout the day.  Patient reports that he has run out of his nebulizers about a week and a half ago.  He has been trying to get them refilled, but the company keeps sending him inhalers instead of nebulizer.  He reports he has been using the inhalers but they are not as effective.  They offer temporary relief, but his symptoms come back.  Patient reports a productive cough with yellow sputum.  He has been taking Mucinex for that.  He reports palpitations that are worse when his dyspnea is worse.  His dyspnea is worse on exertion.  He has had a decrease in his functional status, reporting that it takes him longer to do normal ADLs because he is to keep stopping for breaks to breathe.  Patient denies any chest pain.  On review of systems he reports that when he becomes very short of breath he cannot control his bowels or bladder.  He has no other complaints at this time. ?  ?Of note patient does wear 4 L nasal cannula at baseline for COPD. ?  ?Patient quit smoking about 20 years ago, he does not drink alcohol, does not use illicit drugs, and is vaccinated for COVID.  Patient is DNI. ? ?05/02/2021:  pt now off bipap, he is breathing better, transfer out of stepdown ICU to telemetry. Continue current treatments.  ? ?05/03/2021: refused bipap last night.  He is sitting up in bed, says breathing better than when arrived.  Anticipate can d/c home tomorrow if stable.  ? ?05/04/2021:  Pt feels like he can manage at home, he has pulmonary follow up.  He has been ambulating.  DC home today.   ?

## 2021-05-02 NOTE — H&P (Signed)
?History and Physical  ? ? ?Patient: Jacob Pace [id number] DOB: Jul 15, 1950 ?DOA: 05/02/2021 ?DOS: the patient was seen and examined on 05/02/2021 ?[facility]P: [facility] [place]  ?Patient coming from: Home ? ?Chief Complaint:  ?Chief Complaint  ?Patient presents with  ? Shortness of Breath  ? ?HPI: Jacob Pace is a 71 y.o. male with medical history significant of with history of COPD, hypertension, folic acid deficiency, presents the ED with a chief complaint of dyspnea.  Patient reports it started earlier in the day and it was progressively worse throughout the day.  Patient reports that he has run out of his nebulizers about a week and a half ago.  He has been trying to get them refilled, but the company keeps sending him inhalers instead of nebulizer.  He reports he has been using the inhalers but they are not as effective.  They offer temporary relief, but his symptoms come back.  Patient reports a productive cough with yellow sputum.  He has been taking Mucinex for that.  He reports palpitations that are worse when his dyspnea is worse.  His dyspnea is worse on exertion.  He has had a decrease in his functional status, reporting that it takes him longer to do normal ADLs because he is to keep stopping for breaks to breathe.  Patient denies any chest pain.  On review of systems he reports that when he becomes very short of breath he cannot control his bowels or bladder.  He has no other complaints at this time. ? ?Of note patient does wear 4 L nasal cannula at baseline for COPD. ? ?Patient quit smoking about 20 years ago, he does not drink alcohol, does not use illicit drugs, and is vaccinated for COVID.  Patient is DNI. ?Review of Systems: As mentioned in the history of present illness. All other systems reviewed and are negative. ?Past Medical History:  ?Diagnosis Date  ? COPD (chronic obstructive pulmonary disease) ([place])   ? Emphysema   ? Hypertension   ? ?Past Surgical History:  ?Procedure Laterality  Date  ? HERNIA REPAIR    ? right inguinal hernia repair  2007  ? ?Social History:  reports that he quit smoking about 13 years ago. His smoking use included cigarettes. He has a 40.00 pack-year smoking history. He has never used smokeless tobacco. He reports current alcohol use. He reports that he does not use drugs. ? ?No Known Allergies ? ?Family History  ?Problem Relation Age of Onset  ? Cancer Father   ?     unsure what type  ? Breast cancer Sister   ? ? ?Prior to Admission medications   ?Medication Sig Start Date End Date Taking? Authorizing Provider  ?albuterol (PROVENTIL) (2.5 MG/3ML) 0.083% nebulizer solution Take 6 mLs (5 mg total) by nebulization every 4 (four) hours as needed for wheezing or shortness of breath. 09/25/20   Denton Brick, Courage, MD  ?albuterol (VENTOLIN HFA) 108 (90 Base) MCG/ACT inhaler Inhale 1-2 puffs into the lungs every 6 (six) hours as needed for wheezing or shortness of breath.    [provider]  ?budesonide-formoterol (SYMBICORT) 160-4.5 MCG/ACT inhaler Inhale 2 puffs into the lungs 2 (two) times daily. 09/25/20   Roxan Hockey, MD  ?diltiazem (CARDIZEM CD) 180 MG 24 hr capsule Take 1 capsule (180 mg total) by mouth daily. 09/25/20   Roxan Hockey, MD  ?folic acid (FOLVITE) 1 MG tablet Take 1 tablet (1 mg total) by mouth daily. 09/26/20   Emokpae, Courage,  MD  ?predniSONE (DELTASONE) 10 MG tablet TAKE 2 TABLETS BY MOUTH ONCE DAILY WITH BREAKFAST 03/23/21   Collene Gobble, MD  ?tiotropium (SPIRIVA) 18 MCG inhalation capsule Place 1 capsule (18 mcg total) into inhaler and inhale daily. 09/25/20   Roxan Hockey, MD  ? ? ?Physical Exam: ?Vitals:  ? 05/02/21 0200 05/02/21 0230 05/02/21 0300 05/02/21 0330  ?BP: 137/83 128/81 131/72 130/73  ?Pulse: 80 80 82 81  ?Resp: (!) 24 (!) 24  18  ?Temp:    97.7 ?F (36.5 ?C)  ?TempSrc:    Oral  ?SpO2: 99% 97% 100% 99%  ?Weight:      ?Height:      ? ?1.  General: ?Patient lying supine in bed, head of bed elevated, no acute distress ?  ?2.  Psychiatric: ?Alert and oriented x 3, mood and behavior normal for situation, pleasant and cooperative with exam ?  ?3. Neurologic: ?Speech and language are normal, face is symmetric, moves all 4 extremities voluntarily, at baseline without acute deficits on limited exam ?  ?4. HEENMT:  ?Head is atraumatic, normocephalic, pupils reactive to light, neck is supple, trachea is midline, mucous membranes are moist ?  ?5. Respiratory : ?Mild wheezing in the bilateral lung fields, no rhonchi or rales, no cyanosis, on the BiPAP-no increased work of breathing  ? ?6. Cardiovascular : ?Heart rate normal, rhythm is regular, no murmurs, rubs or gallops, no peripheral edema, peripheral pulses palpated ?  ?7. Gastrointestinal:  ?Abdomen is soft, slightly distended, nontender to palpation bowel sounds active, no masses or organomegaly palpated ?  ?8. Skin:  ?Skin is warm, dry and intact without rashes, acute lesions, or ulcers on limited exam ?  ?9.Musculoskeletal:  ?No acute deformities or trauma, no asymmetry in tone, no peripheral edema, peripheral pulses palpated, no tenderness to palpation in the extremities ? ?Data Reviewed: ?In the ED ?Temp 97.5, heart rate 80-86, respiratory rate 18-24, blood pressure 160/83, satting 99% on 50% FiO2 BiPAP ?Blood gas shows a pH of 7.4, CO2 64, bicarb 40 ?Patient has a leukocytosis with white blood cell count of 15.5, hemoglobin 13.9 ?Chemistry panel is unremarkable ?Initial troponin 7 ?Chest x-ray shows emphysematous changes and bronchitic changes with scattered fibrosis.  No focal consolidation ?EKG shows a heart rate of 86, sinus rhythm, QTc 457 ?Patient was given steroids with EMS, 2 DuoNeb treatments in the ED, 1 mg of Ativan ?Admission requested for further treatment of COPD exacerbation ? ?Assessment and Plan: ?* COPD exacerbation ([place]) ?-Continue Dulera in place of home Symbicort ?-Scheduled DuoNebs ?-As needed albuterol ?-Chronic retainer with a pH of 7.4 in the PCO2 of  64 ?-Currently on BiPAP for work of breathing, maintaining oxygen sats on 50% FiO2 ?-Continue systemic steroids ?-Zithromax for mild COPD exacerbation per COPD order set ?-Continue to monitor ? ?Fall at home, initial encounter ?Patient reports left leg gave out ?Did not hit head, did not have LOC ?PT eval and treat ? ?Leukocytosis ?Chronically elevated ?No pneumonia on chest xray ?No other infectious symptoms ?Check procal ?Likely 2/2 chronic steroid use ?Trend in the AM ? ?Essential (primary) hypertension ?Continue diltiazem ? ? ? ? ? Advance Care Planning:   Code Status: Partial Code DNI ? ?Consults: None ? ?Family Communication: No family at bedside ? ? ?Severity of Illness: ?The appropriate patient status for this patient is INPATIENT. Inpatient status is judged to be reasonable and necessary in order to provide the required intensity of service to ensure the patient's safety. The patient's presenting  symptoms, physical exam findings, and initial radiographic and laboratory data in the context of their chronic comorbidities is felt to place them at high risk for further clinical deterioration. Furthermore, it is not anticipated that the patient will be medically stable for discharge from the hospital within 2 midnights of admission.  ? ?* I certify that at the point of admission it is my clinical judgment that the patient will require inpatient hospital care spanning beyond 2 midnights from the point of admission due to high intensity of service, high risk for further deterioration and high frequency of surveillance required.* ? ?Author: ?Rolla Plate, DO ?05/02/2021 4:14 AM ? ?For on call review [email or web address].  ?

## 2021-05-02 NOTE — Progress Notes (Signed)
ASSUMPTION OF CARE NOTE  ? ?05/02/2021 ?12:43 PM ? ?Jacob Pace was seen and examined.  The H&P by the admitting provider, orders, imaging was reviewed.  Please see new orders.  Will continue to follow.  ? ?71 y.o. male with medical history significant of with history of COPD, hypertension, folic acid deficiency, presents the ED with a chief complaint of dyspnea.  Patient reports it started earlier in the day and it was progressively worse throughout the day.  Patient reports that he has run out of his nebulizers about a week and a half ago.  He has been trying to get them refilled, but the company keeps sending him inhalers instead of nebulizer.  He reports he has been using the inhalers but they are not as effective.  They offer temporary relief, but his symptoms come back.  Patient reports a productive cough with yellow sputum.  He has been taking Mucinex for that.  He reports palpitations that are worse when his dyspnea is worse.  His dyspnea is worse on exertion.  He has had a decrease in his functional status, reporting that it takes him longer to do normal ADLs because he is to keep stopping for breaks to breathe.  Patient denies any chest pain.  On review of systems he reports that when he becomes very short of breath he cannot control his bowels or bladder.  He has no other complaints at this time. ?  ?Of note patient does wear 4 L nasal cannula at baseline for COPD. ?  ?Patient quit smoking about 20 years ago, he does not drink alcohol, does not use illicit drugs, and is vaccinated for COVID.  Patient is DNI. ? ?05/02/2021:  pt now off bipap, he is breathing better, transfer out of stepdown ICU to telemetry. Continue current treatments.  ? ?Assessment and Plan: ?* COPD exacerbation (HCC) ?-Continue Dulera in place of home Symbicort ?-Schedule DuoNebs ?-As needed albuterol ?-Chronic retainer with a pH of 7.4 in the PCO2 of 64 ?-he has been taken off bipap and back on [place] ?-Continue systemic  steroids ?-Zithromax for mild COPD exacerbation per COPD order set ?-Continue to monitor ? ?Fall at home ?Patient reports left leg gave out ?Did not hit head, did not have LOC ?PT eval and treat evaluation pending  ? ?Leukocytosis ?Chronically elevated ?No pneumonia on chest xray ?Likely 2/2 chronic steroid use ?Trending down with treatments  ? ?Essential (primary) hypertension ?Continue diltiazem BP control and follow ? ? ? ? ? ? ?Vitals:  ? 05/02/21 1100 05/02/21 1134  ?BP: 121/66   ?Pulse: 81   ?Resp: (!) 23   ?Temp:  98.2 ?F (36.8 ?C)  ?SpO2: 95%   ? ? ?Results for orders placed or performed during the hospital encounter of 05/02/21  ?Resp Panel by RT-PCR (Flu A&B, Covid) Nasopharyngeal Swab  ? Specimen: Nasopharyngeal Swab; Nasopharyngeal(NP) swabs in vial transport medium  ?Result Value Ref Range  ? SARS Coronavirus 2 by RT PCR NEGATIVE NEGATIVE  ? Influenza A by PCR NEGATIVE NEGATIVE  ? Influenza B by PCR NEGATIVE NEGATIVE  ?MRSA Next Gen by PCR, Nasal  ? Specimen: Nasal Mucosa; Nasal Swab  ?Result Value Ref Range  ? MRSA by PCR Next Gen NOT DETECTED NOT DETECTED  ?CBC  ?Result Value Ref Range  ? WBC 15.5 (H) 4.0 - 10.5 K/uL  ? RBC 4.93 4.22 - 5.81 MIL/uL  ? Hemoglobin 13.9 13.0 - 17.0 g/dL  ? HCT 45.7 39.0 - 52.0 %  ?  MCV 92.7 80.0 - 100.0 fL  ? MCH 28.2 26.0 - 34.0 pg  ? MCHC 30.4 30.0 - 36.0 g/dL  ? RDW 14.3 11.5 - 15.5 %  ? Platelets 258 150 - 400 K/uL  ? nRBC 0.0 0.0 - 0.2 %  ?Basic metabolic panel  ?Result Value Ref Range  ? Sodium 141 135 - 145 mmol/L  ? Potassium 4.2 3.5 - 5.1 mmol/L  ? Chloride 98 98 - 111 mmol/L  ? CO2 35 (H) 22 - 32 mmol/L  ? Glucose, Bld 101 (H) 70 - 99 mg/dL  ? BUN 18 8 - 23 mg/dL  ? Creatinine, Ser 0.94 0.61 - 1.24 mg/dL  ? Calcium 9.3 8.9 - 10.3 mg/dL  ? GFR, Estimated >60 >60 mL/min  ? Anion gap 8 5 - 15  ?Blood gas, venous  ?Result Value Ref Range  ? FIO2 60.00 %  ? pH, Ven 7.4 7.25 - 7.43  ? pCO2, Ven 64 (H) 44 - 60 mmHg  ? pO2, Ven 40 32 - 45 mmHg  ? Bicarbonate 40.0 (H)  20.0 - 28.0 mmol/L  ? Acid-Base Excess 12.1 (H) 0.0 - 2.0 mmol/L  ? O2 Saturation 74.3 %  ? Patient temperature 36.4   ? Collection site LEFT ANTECUBITAL   ? Drawn by [id number]   ?Comprehensive metabolic panel  ?Result Value Ref Range  ? Sodium 141 135 - 145 mmol/L  ? Potassium 4.3 3.5 - 5.1 mmol/L  ? Chloride 100 98 - 111 mmol/L  ? CO2 31 22 - 32 mmol/L  ? Glucose, Bld 158 (H) 70 - 99 mg/dL  ? BUN 23 8 - 23 mg/dL  ? Creatinine, Ser 0.87 0.61 - 1.24 mg/dL  ? Calcium 9.1 8.9 - 10.3 mg/dL  ? Total Protein 6.7 6.5 - 8.1 g/dL  ? Albumin 3.5 3.5 - 5.0 g/dL  ? AST 23 15 - 41 U/L  ? ALT 18 0 - 44 U/L  ? Alkaline Phosphatase 52 38 - 126 U/L  ? Total Bilirubin 0.5 0.3 - 1.2 mg/dL  ? GFR, Estimated >60 >60 mL/min  ? Anion gap 10 5 - 15  ?Magnesium  ?Result Value Ref Range  ? Magnesium 2.1 1.7 - 2.4 mg/dL  ?CBC WITH DIFFERENTIAL  ?Result Value Ref Range  ? WBC 14.1 (H) 4.0 - 10.5 K/uL  ? RBC 4.73 4.22 - 5.81 MIL/uL  ? Hemoglobin 13.0 13.0 - 17.0 g/dL  ? HCT 44.6 39.0 - 52.0 %  ? MCV 94.3 80.0 - 100.0 fL  ? MCH 27.5 26.0 - 34.0 pg  ? MCHC 29.1 (L) 30.0 - 36.0 g/dL  ? RDW 14.3 11.5 - 15.5 %  ? Platelets 265 150 - 400 K/uL  ? nRBC 0.0 0.0 - 0.2 %  ? Neutrophils Relative % 96 %  ? Neutro Abs 13.6 (H) 1.7 - 7.7 K/uL  ? Lymphocytes Relative 2 %  ? Lymphs Abs 0.2 (L) 0.7 - 4.0 K/uL  ? Monocytes Relative 1 %  ? Monocytes Absolute 0.1 0.1 - 1.0 K/uL  ? Eosinophils Relative 0 %  ? Eosinophils Absolute 0.0 0.0 - 0.5 K/uL  ? Basophils Relative 0 %  ? Basophils Absolute 0.0 0.0 - 0.1 K/uL  ? Immature Granulocytes 1 %  ? Abs Immature Granulocytes 0.18 (H) 0.00 - 0.07 K/uL  ?Troponin I (High Sensitivity)  ?Result Value Ref Range  ? Troponin I (High Sensitivity) 7 <18 ng/L  ?Troponin I (High Sensitivity)  ?Result Value Ref Range  ? Troponin I (  High Sensitivity) 7 <18 ng/L  ? ? ? ?C. Laural Benes, MD ?[facility] ? ? 05/02/2021 12:34 AM ?How to contact the [facility] Attending or Consulting provider 7A - 7P or covering provider during after hours 7P -7A,  for this patient?  ?Check the care team in [facility] and look for a) attending/consulting [facility] provider listed and b) the [facility] team listed ?Log into [email or web address] and use [place]'s universal password to access. If you do not have the password, please contact the hospital operator. ?Locate the [facility] provider you are looking for under [facility] and page to a number that you can be directly reached. ?If you still have difficulty reaching the provider, please page the [facility] (Director on Call) for the Hospitalists listed on amion for assistance. ? ?

## 2021-05-02 NOTE — ED Provider Notes (Signed)
?[place] DEPT ?[facility] Emergency Department ?Provider Note ?MRN:  [id number]  ?Arrival date & time: 05/02/21    ? ?Chief Complaint   ?Shortness of Breath ?  ?History of Present Illness   ?Jacob Pace is a 71 y.o. year-old male with a history of COPD presenting to the ED with chief complaint of shortness of breath. ? ?EMS called for respiratory distress.  Out of inhalers for the past few days. ? ?Review of Systems  ?A thorough review of systems was obtained and all systems are negative except as noted in the HPI and PMH.  ? ?Patient's Health History   ? ?Past Medical History:  ?Diagnosis Date  ? COPD (chronic obstructive pulmonary disease) ([place])   ? Emphysema   ? Hypertension   ?  ?Past Surgical History:  ?Procedure Laterality Date  ? HERNIA REPAIR    ? right inguinal hernia repair  2007  ?  ?Family History  ?Problem Relation Age of Onset  ? Cancer Father   ?     unsure what type  ? Breast cancer Sister   ?  ?Social History  ? ?Socioeconomic History  ? Marital status: Legally Separated  ?  Spouse name: Not on file  ? Number of children: Not on file  ? Years of education: Not on file  ? Highest education level: Not on file  ?Occupational History  ? Occupation: Engineer, materials  ?Tobacco Use  ? Smoking status: Former  ?  Packs/day: 1.00  ?  Years: 40.00  ?  Pack years: 40.00  ?  Types: Cigarettes  ?  Quit date: 02/01/2008  ?  Years since quitting: 13.2  ? Smokeless tobacco: Never  ?Vaping Use  ? Vaping Use: Never used  ?Substance and Sexual Activity  ? Alcohol use: Yes  ?  Comment: weekends  ? Drug use: No  ? Sexual activity: Never  ?Other Topics Concern  ? Not on file  ?Social History Narrative  ? seperated with children  ?   ?   ? ?Social Determinants of Health  ? ?Financial Resource Strain: Not on file  ?Food Insecurity: Not on file  ?Transportation Needs: Not on file  ?Physical Activity: Not on file  ?Stress: Not on file  ?Social Connections: Not on file  ?Intimate Partner Violence: Not on file  ?   ? ?Physical Exam  ? ?Vitals:  ? 05/02/21 0130 05/02/21 0200  ?BP: (!) 142/77 137/83  ?Pulse: 82 80  ?Resp: (!) 21 (!) 24  ?Temp:    ?SpO2: 98% 99%  ?  ?CONSTITUTIONAL: Chronically ill-appearing, in moderate to severe respiratory distress ?NEURO/PSYCH:  Alert and oriented x 3, no focal deficits ?EYES:  eyes equal and reactive ?ENT/NECK:  no LAD, no JVD ?CARDIO: Regular rate, well-perfused, normal S1 and S2 ?PULM: Very poor air movement throughout, tachypneic, accessory muscle use ?GI/GU:  non-distended, non-tender ?MSK/SPINE:  No gross deformities, no edema ?SKIN:  no rash, atraumatic ? ? ?*Additional and/or pertinent findings included in MDM below ? ?Diagnostic and Interventional Summary  ? ? EKG Interpretation ? ?Date/Time:  Sunday May 02 2021 00:38:59 EDT ?Ventricular Rate:  86 ?PR Interval:  161 ?QRS Duration: 110 ?QT Interval:  382 ?QTC Calculation: 457 ?R Axis:   70 ?Text Interpretation: Sinus rhythm Low voltage, extremity and precordial leads Nonspecific T abnrm, anterolateral leads Confirmed by Gerlene Fee [id number]) on 05/02/2021 1:09:25 AM ?  ? ?  ? ?Labs Reviewed  ?CBC - Abnormal; Notable for the following components:  ?  Result Value  ? WBC 15.5 (*)   ? All other components within normal limits  ?BASIC METABOLIC PANEL - Abnormal; Notable for the following components:  ? CO2 35 (*)   ? Glucose, Bld 101 (*)   ? All other components within normal limits  ?BLOOD GAS, VENOUS - Abnormal; Notable for the following components:  ? pCO2, Ven 64 (*)   ? Bicarbonate 40.0 (*)   ? Acid-Base Excess 12.1 (*)   ? All other components within normal limits  ?RESP PANEL BY RT-PCR (FLU A&B, COVID) [id number]  ?TROPONIN I (HIGH SENSITIVITY)  ?TROPONIN I (HIGH SENSITIVITY)  ?  ?DG Chest Port 1 View  ?Final Result  ?  ?  ?Medications  ?ipratropium-albuterol (DUONEB) 0.5-2.5 (3) MG/3ML nebulizer solution 3 mL (0 mLs Nebulization Hold 05/02/21 0118)  ?ipratropium-albuterol (DUONEB) 0.5-2.5 (3) MG/3ML nebulizer solution 3 mL (0 mLs  Nebulization Hold 05/02/21 0118)  ?LORazepam (ATIVAN) injection 1 mg (1 mg Intravenous Given 05/02/21 0046)  ?ipratropium (ATROVENT) 0.02 % nebulizer solution (0.5 mg  Given 05/02/21 0052)  ?albuterol (PROVENTIL) (2.5 MG/3ML) 0.083% nebulizer solution (15 mg  Given 05/02/21 0052)  ?  ? ?Procedures  /  Critical Care ?.Critical Care ?Performed by: Maudie Flakes, MD ?Authorized by: Maudie Flakes, MD  ? ?Critical care provider statement:  ?  Critical care time (minutes):  45 ?  Critical care was necessary to treat or prevent imminent or life-threatening deterioration of the following conditions:  Respiratory failure ?  Critical care was time spent personally by me on the following activities:  Development of treatment plan with patient or surrogate, discussions with consultants, evaluation of patient's response to treatment, examination of patient, ordering and review of laboratory studies, ordering and review of radiographic studies, ordering and performing treatments and interventions, pulse oximetry, re-evaluation of patient's condition and review of old charts ? ?ED Course and Medical Decision Making  ?Initial Impression and Ddx ?History and exam is consistent with severe COPD exacerbation.  Poor air movement, providing nebs, BiPAP.  Received steroids with EMS. ? ?Past medical/surgical history that increases complexity of ED encounter: COPD ? ?Interpretation of Diagnostics ?I personally reviewed the EKG and my interpretation is as follows: Sinus rhythm ?   ?Chest x-ray without pneumothorax, labs do not reveal any significant blood count or electrolyte disturbance. ? ?Patient Reassessment and Ultimate Disposition/Management ?Patient doing much better on BiPAP, will admit to medicine. ? ?Patient management required discussion with the following services or consulting groups:  Hospitalist Service ? ?Complexity of Problems Addressed ?Chronic illness with severe exacerbation ? ?Additional Data Reviewed and Analyzed ?Further  history obtained from: ?EMS on arrival ? ?Additional Factors Impacting ED Encounter Risk ?Consideration of hospitalization ? ?Barth Kirks. Sedonia Small, MD ?[facility] Emergency Medicine ?[place] ?[email or web address] ? ?Final Clinical Impressions(s) / ED Diagnoses  ? ?  ICD-10-CM   ?1. COPD exacerbation ([place])  J44.1   ?  ?  ?ED Discharge Orders   ? ? None  ? ?  ?  ? ?Discharge Instructions Discussed with and Provided to Patient:  ? ?Discharge Instructions   ?None ?  ? ?  ?Maudie Flakes, MD ?05/02/21 [phone] ? ?

## 2021-05-02 NOTE — ED Notes (Signed)
Patient has claustrophobia he is doing well on BiPAP , receiving continuous neb. Has had ativan for nerves. Can't wait to come off BiPAP.  Actually should be able to soon in 2 hours or less. ?

## 2021-05-02 NOTE — Assessment & Plan Note (Addendum)
Patient reports left leg gave out ?Did not hit head, did not have LOC ?PT eval saying he does not have PT needs ?

## 2021-05-02 NOTE — Assessment & Plan Note (Addendum)
-  treated with Dulera in place of home Symbicort in hospital only ?-resume home bronchodilators at discharge  ?-stable for DC home today with close outpatient follow up ?-As needed albuterol neb/inhaler ?-Chronic retainer with a pH of 7.4 in the PCO2 of 64 ?-he has declined to use the bipap for last 2 days.  ?-Continue systemic steroids - taper back down to home dose of 20 mg daily over next 10 days ?-Zithromax for mild COPD exacerbation  ?-DC home  ?

## 2021-05-02 NOTE — ED Notes (Signed)
Off BiPAP , placed on 5 liters nasal  cannula. Wears 4 at home.all the time. Moved to unit if needs BiPAP again. ?

## 2021-05-02 NOTE — ED Triage Notes (Signed)
Rcems from home. Cc of sob. Pt has been out of his albuterol for appx 2 weeks. When ems arrived pt was 86% on his home 4l. Ems gave 125mg  solumedrol and 2 nebs . 96% on 6l.  ?Denies pain. Alert and oriented.  ? ?

## 2021-05-02 NOTE — Assessment & Plan Note (Addendum)
Chronically elevated ?No pneumonia on chest xray ?Likely 2/2 chronic steroid use ?Trending down with treatments  ?

## 2021-05-02 NOTE — Progress Notes (Signed)
Report given to [facility], LPN. Transferred to Rm [id number] via wheelchair.  ?

## 2021-05-03 DIAGNOSIS — D72829 Elevated white blood cell count, unspecified: Secondary | ICD-10-CM | POA: Diagnosis not present

## 2021-05-03 DIAGNOSIS — I10 Essential (primary) hypertension: Secondary | ICD-10-CM | POA: Diagnosis not present

## 2021-05-03 DIAGNOSIS — J441 Chronic obstructive pulmonary disease with (acute) exacerbation: Secondary | ICD-10-CM | POA: Diagnosis not present

## 2021-05-03 NOTE — Progress Notes (Signed)
Patient declined use of sleep CPAP / BiPAP. He stated he is claustrophobic and would prefer not to use.  ?

## 2021-05-03 NOTE — Progress Notes (Signed)
Patient got SOB while on 4L, complaining that nothing was coming through tubing.  Placed patient on 5L and patient stated that he could feel it.  Left patient on 5L for now. ?

## 2021-05-03 NOTE — Progress Notes (Signed)
PT Cancellation Note ? ?Patient Details ?Name: Jacob Pace ?MRN: [id number] ?DOB: 08-14-1950 ? ? ?Cancelled Treatment:    Reason Eval/Treat Not Completed: PT screened, no needs identified, will sign off.  Patient ambulating independently in room. ? ? ?11:14 AM, 05/03/21 ?Ocie Bob, MPT ?Physical Therapist with [place] ?[facility] ?[phone] office ?[phone] mobile phone ? ?

## 2021-05-03 NOTE — Progress Notes (Signed)
Patient refusing BIPAP. No unit in room. ?

## 2021-05-03 NOTE — TOC Progression Note (Signed)
Transition of Care (TOC) - Progression Note  ? ? ?Patient Details  ?Name: Jacob Pace ?MRN: [id number] ?Date of Birth: April 03, 1950 ? ?Transition of Care (TOC) CM/SW Contact  ?Salome Arnt, LCSW ?Phone Number: ?05/03/2021, 11:08 AM ? ?Clinical Narrative:   ?Transition of Care (TOC) Screening Note ? ? ?Patient Details  ?Name: Jacob Pace ?Date of Birth: [birth year] ? ? ?Transition of Care (TOC) CM/SW Contact:    ?Salome Arnt, LCSW ?Phone Number: ?05/03/2021, 11:08 AM ? ? ? ?Transition of Care Department [facility]) has reviewed patient and no TOC needs have been identified at this time. We will continue to monitor patient advancement through interdisciplinary progression rounds. If new patient transition needs arise, please place a TOC consult. ?   ? ? ? ?  ?Barriers to Discharge: Continued Medical Work up ? ?Expected Discharge Plan and Services ?  ?  ?  ?  ?  ?                ?  ?  ?  ?  ?  ?  ?  ?  ?  ?  ? ? ?Social Determinants of Health (SDOH) Interventions ?  ? ?Readmission Risk Interventions ?   ? View : No data to display.  ?  ?  ?  ? ? ?

## 2021-05-03 NOTE — Progress Notes (Signed)
?PROGRESS NOTE ? ? ?Jacob Pace  [id number] DOB: Jan 26, 1951 DOA: 05/02/2021 ?[facility]P: [facility] [place]  ? ?Chief Complaint  ?Patient presents with  ? Shortness of Breath  ? ?Level of care: Telemetry ? ?Brief Admission History:  ?71 y.o. male with medical history significant of with history of COPD, hypertension, folic acid deficiency, presents the ED with a chief complaint of dyspnea.  Patient reports it started earlier in the day and it was progressively worse throughout the day.  Patient reports that he has run out of his nebulizers about a week and a half ago.  He has been trying to get them refilled, but the company keeps sending him inhalers instead of nebulizer.  He reports he has been using the inhalers but they are not as effective.  They offer temporary relief, but his symptoms come back.  Patient reports a productive cough with yellow sputum.  He has been taking Mucinex for that.  He reports palpitations that are worse when his dyspnea is worse.  His dyspnea is worse on exertion.  He has had a decrease in his functional status, reporting that it takes him longer to do normal ADLs because he is to keep stopping for breaks to breathe.  Patient denies any chest pain.  On review of systems he reports that when he becomes very short of breath he cannot control his bowels or bladder.  He has no other complaints at this time. ?  ?Of note patient does wear 4 L nasal cannula at baseline for COPD. ?  ?Patient quit smoking about 20 years ago, he does not drink alcohol, does not use illicit drugs, and is vaccinated for COVID.  Patient is DNI. ? ?05/02/2021:  pt now off bipap, he is breathing better, transfer out of stepdown ICU to telemetry. Continue current treatments.  ? ?05/03/2021: refused bipap last night.  He is sitting up in bed, says breathing better than when arrived.  Anticipate can d/c home tomorrow if stable.  ?  ?Assessment and Plan: ?* COPD exacerbation ([place]) ?-Continue Dulera in place of home  Symbicort ?-Schedule DuoNebs ?-As needed albuterol ?-Chronic retainer with a pH of 7.4 in the PCO2 of 64 ?-he has been taken off bipap and back on [place] ?-Continue systemic steroids ?-Zithromax for mild COPD exacerbation per COPD order set ?-Continue to monitor ? ?Fall at home ?Patient reports left leg gave out ?Did not hit head, did not have LOC ?PT eval saying he does not have PT needs ? ?Leukocytosis ?Chronically elevated ?No pneumonia on chest xray ?Likely 2/2 chronic steroid use ?Trending down with treatments  ? ?Essential (primary) hypertension ?Continue diltiazem BP control and follow ? ? ?DVT prophylaxis: [place] heparin ?Code Status: partial (DNI) ?Family Communication:  ?Disposition: Status is: Inpatient ?Remains inpatient appropriate because: not at baseline respiratory status yet, continue treatments, he needs more time ?  ?Consultants:  ?PT  ?Procedures:  ?bipap ?Antimicrobials:  ?Azithromycin   ?Subjective: ?Pt reports that he is having SOB with exertion.  He is speaking in short sentences.  He is not at his baseline.  ?Objective: ?Vitals:  ? 05/03/21 0715 05/03/21 0841 05/03/21 1229 05/03/21 1305  ?BP:  126/74 (!) 143/80   ?Pulse:   71   ?Resp:   20   ?Temp:   98.2 ?F (36.8 ?C)   ?TempSrc:   Oral   ?SpO2: 97%  100% 99%  ?Weight:      ?Height:      ? ? ?Intake/Output Summary (Last 24  hours) at 05/03/2021 1418 ?Last data filed at 05/03/2021 1300 ?Gross per 24 hour  ?Intake 1160 ml  ?Output --  ?Net 1160 ml  ? ?Filed Weights  ? 05/02/21 0038 05/02/21 0414 05/02/21 1416  ?Weight: 99 kg 102.9 kg 103.1 kg  ? ?Examination: ? ?General exam: Appears calm and comfortable  ?Respiratory system: poor air movement, mild increased work of breathing.  ?Cardiovascular system: normal S1 & S2 heard. No JVD, murmurs, rubs, gallops or clicks. No pedal edema. ?Gastrointestinal system: Abdomen is nondistended, soft and nontender. No organomegaly or masses felt. Normal bowel sounds heard. ?Central nervous system: Alert and oriented. No  focal neurological deficits. ?Extremities: Symmetric 5 x 5 power. ?Skin: No rashes, lesions or ulcers. ?Psychiatry: Judgement and insight appear normal. Mood & affect appropriate.  ? ?Data Reviewed: I have personally reviewed following labs and imaging studies ? ?CBC: ?Recent Labs  ?Lab 05/02/21 ?0052 05/02/21 ?0543  ?WBC 15.5* 14.1*  ?NEUTROABS  --  13.6*  ?HGB 13.9 13.0  ?HCT 45.7 44.6  ?MCV 92.7 94.3  ?PLT 258 265  ? ? ?Basic Metabolic Panel: ?Recent Labs  ?Lab 05/02/21 ?0052 05/02/21 ?0543  ?NA 141 141  ?K 4.2 4.3  ?CL 98 100  ?CO2 35* 31  ?GLUCOSE 101* 158*  ?BUN 18 23  ?CREATININE 0.94 0.87  ?CALCIUM 9.3 9.1  ?MG  --  2.1  ? ? ?CBG: ?No results for input(s): GLUCAP in the last 168 hours. ? ?Recent Results (from the past 240 hour(s))  ?Resp Panel by RT-PCR (Flu A&B, Covid) Nasopharyngeal Swab     Status: None  ? Collection Time: 05/02/21 12:05 AM  ? Specimen: Nasopharyngeal Swab; Nasopharyngeal(NP) swabs in vial transport medium  ?Result Value Ref Range Status  ? SARS Coronavirus 2 by RT PCR NEGATIVE NEGATIVE Final  ?  Comment: (NOTE) ?SARS-CoV-2 target nucleic acids are NOT DETECTED. ? ?The SARS-CoV-2 RNA is generally detectable in upper respiratory ?specimens during the acute phase of infection. The lowest ?concentration of SARS-CoV-2 viral copies this assay can detect is ?138 copies/mL. A negative result does not preclude SARS-Cov-2 ?infection and should not be used as the sole basis for treatment or ?other patient management decisions. A negative result may occur with  ?improper specimen collection/handling, submission of specimen other ?than nasopharyngeal swab, presence of viral mutation(s) within the ?areas targeted by this assay, and inadequate number of viral ?copies(<138 copies/mL). A negative result must be combined with ?clinical observations, patient history, and epidemiological ?information. The expected result is Negative. ? ?Fact Sheet for Patients:   ?EntrepreneurPulse.com.au ? ?Fact Sheet for Healthcare Providers:  ?IncredibleEmployment.be ? ?This test is no t yet approved or cleared by the [place] FDA and  ?has been authorized for detection and/or diagnosis of SARS-CoV-2 by ?FDA under an Emergency Use Authorization (EUA). This EUA will remain  ?in effect (meaning this test can be used) for the duration of the ?COVID-19 declaration under Section 564(b)(1) of the Act, 21 ?U.S.C.section 360bbb-3(b)(1), unless the authorization is terminated  ?or revoked sooner.  ? ? ?  ? Influenza A by PCR NEGATIVE NEGATIVE Final  ? Influenza B by PCR NEGATIVE NEGATIVE Final  ?  Comment: (NOTE) ?The Xpert Xpress SARS-CoV-2/FLU/RSV plus assay is intended as an aid ?in the diagnosis of influenza from Nasopharyngeal swab specimens and ?should not be used as a sole basis for treatment. Nasal washings and ?aspirates are unacceptable for Xpert Xpress SARS-CoV-2/FLU/RSV ?testing. ? ?Fact Sheet for Patients: ?EntrepreneurPulse.com.au ? ?Fact Sheet for Healthcare Providers: ?IncredibleEmployment.be ? ?  This test is not yet approved or cleared by the [place] FDA and ?has been authorized for detection and/or diagnosis of SARS-CoV-2 by ?FDA under an Emergency Use Authorization (EUA). This EUA will remain ?in effect (meaning this test can be used) for the duration of the ?COVID-19 declaration under Section 564(b)(1) of the Act, 21 U.S.C. ?section 360bbb-3(b)(1), unless the authorization is terminated or ?revoked. ? ?Performed at [facility], [address] [place], [place] [address] ?  ?MRSA Next Gen by PCR, Nasal     Status: None  ? Collection Time: 05/02/21  4:00 AM  ? Specimen: Nasal Mucosa; Nasal Swab  ?Result Value Ref Range Status  ? MRSA by PCR Next Gen NOT DETECTED NOT DETECTED Final  ?  Comment: (NOTE) ?The GeneXpert MRSA Assay (FDA approved for NASAL specimens only), ?is one component of a  comprehensive MRSA colonization surveillance ?program. It is not intended to diagnose MRSA infection nor to guide ?or monitor treatment for MRSA infections. ?Test performance is not FDA approved in patients less than 2 years ?old. ?Performed at [facility], [facility], [facility]

## 2021-05-03 NOTE — Progress Notes (Signed)
Patient has very little air movement on inspiration, suspect this is chronic. ?

## 2021-05-04 MED ORDER — ALBUTEROL SULFATE (2.5 MG/3ML) 0.083% IN NEBU
5.0000 mg | INHALATION_SOLUTION | RESPIRATORY_TRACT | 2 refills | Status: AC | PRN
Start: 2021-05-04 — End: ?

## 2021-05-04 MED ORDER — PREDNISONE 10 MG PO TABS: ORAL_TABLET | ORAL | 1 refills | Status: AC

## 2021-05-04 MED ORDER — GUAIFENESIN ER 600 MG PO TB12: 1200.0000 mg | ORAL_TABLET | Freq: Two times a day (BID) | ORAL | 0 refills | Status: AC

## 2021-05-04 MED ORDER — AZITHROMYCIN 500 MG PO TABS
ORAL_TABLET | ORAL | 0 refills | Status: AC
Start: 2021-05-05 — End: ?

## 2021-05-04 NOTE — Discharge Summary (Signed)
Physician Discharge Summary  ?Jacob Pace [id number] DOB: 12-29-1950 DOA: 05/02/2021 ? ?PCP: [facility] ? ?Admit date: 05/02/2021 ?Discharge date: 05/04/2021 ? ?Admitted From:  HOME  ?Disposition: HOME  ? ?Recommendations for Outpatient Follow-up:  ?Follow up with PCP in 1 weeks ?Please follow up with pulmonologist in 2 weeks ? ?Discharge Condition: STABLE at baseline  ?CODE STATUS: PARTIAL  ?DIET: Resume prior home diet   ? ?Brief Hospitalization Summary: ?Please see all hospital notes, images, labs for full details of the hospitalization. ?71 y.o. male with medical history significant of with history of COPD, hypertension, folic acid deficiency, presents the ED with a chief complaint of dyspnea.  Patient reports it started earlier in the day and it was progressively worse throughout the day.  Patient reports that he has run out of his nebulizers about a week and a half ago.  He has been trying to get them refilled, but the company keeps sending him inhalers instead of nebulizer.  He reports he has been using the inhalers but they are not as effective.  They offer temporary relief, but his symptoms come back.  Patient reports a productive cough with yellow sputum.  He has been taking Mucinex for that.  He reports palpitations that are worse when his dyspnea is worse.  His dyspnea is worse on exertion.  He has had a decrease in his functional status, reporting that it takes him longer to do normal ADLs because he is to keep stopping for breaks to breathe.  Patient denies any chest pain.  On review of systems he reports that when he becomes very short of breath he cannot control his bowels or bladder.  He has no other complaints at this time. ?  ?Of note patient does wear 4 L nasal cannula at baseline for COPD. ?  ?Patient quit smoking about 20 years ago, he does not drink alcohol, does not use illicit drugs, and is vaccinated for COVID.  Patient is DNI. ? ?05/02/2021:  pt now off bipap, he is breathing  better, transfer out of stepdown ICU to telemetry. Continue current treatments.  ? ?05/03/2021: refused bipap last night.  He is sitting up in bed, says breathing better than when arrived.  Anticipate can d/c home tomorrow if stable.  ? ?05/04/2021:  Pt feels like he can manage at home, he has pulmonary follow up.  He has been ambulating.  DC home today.   ? ? ?HOSPITAL COURSE BY PROBLEM LIST ? ?Assessment and Plan: ?* COPD exacerbation ([place]) ?-treated with Dulera in place of home Symbicort in hospital only ?-resume home bronchodilators at discharge  ?-stable for DC home today with close outpatient follow up ?-As needed albuterol neb/inhaler ?-Chronic retainer with a pH of 7.4 in the PCO2 of 64 ?-he has declined to use the bipap for last 2 days.  ?-Continue systemic steroids - taper back down to home dose of 20 mg daily over next 10 days ?-Zithromax for mild COPD exacerbation  ?-DC home  ? ?Fall at home ?Patient reports left leg gave out ?Did not hit head, did not have LOC ?PT eval saying he does not have PT needs ? ?Leukocytosis ?Chronically elevated ?No pneumonia on chest xray ?Likely 2/2 chronic steroid use ?Trending down with treatments  ? ?Essential (primary) hypertension ?Continue diltiazem BP control and follow ? ? ?Discharge Diagnoses:  ?Principal Problem: ?  COPD exacerbation ([place]) ?Active Problems: ?  Essential (primary) hypertension ?  Leukocytosis ?  Fall at home ? ? ?  Discharge Instructions: ? ?Allergies as of 05/04/2021   ? ?   Reactions  ? Chlorhexidine Other (See Comments)  ? Pt unsure of this allergy  ? ?  ? ?  ?Medication List  ?  ? ?TAKE these medications   ? ?albuterol 108 (90 Base) MCG/ACT inhaler ?Commonly known as: VENTOLIN HFA ?Inhale 1-2 puffs into the lungs every 6 (six) hours as needed for wheezing or shortness of breath. ?  ?albuterol (2.5 MG/3ML) 0.083% nebulizer solution ?Commonly known as: PROVENTIL ?Take 6 mLs (5 mg total) by nebulization every 4 (four) hours as needed for wheezing or  shortness of breath. ?  ?aspirin EC 81 MG tablet ?Take 81 mg by mouth daily. Swallow whole. ?  ?azithromycin 500 MG tablet ?Commonly known as: ZITHROMAX ?TAKE 1 TAB PO DAILY ?Start taking on: May 05, 2021 ?  ?budesonide-formoterol 160-4.5 MCG/ACT inhaler ?Commonly known as: SYMBICORT ?Inhale 2 puffs into the lungs 2 (two) times daily. ?  ?diltiazem 180 MG 24 hr capsule ?Commonly known as: CARDIZEM CD ?Take 1 capsule (180 mg total) by mouth daily. ?  ?folic acid 1 MG tablet ?Commonly known as: FOLVITE ?Take 1 tablet (1 mg total) by mouth daily. ?  ?guaiFENesin 600 MG 12 hr tablet ?Commonly known as: Mucinex ?Take 2 tablets (1,200 mg total) by mouth 2 (two) times daily for 5 days. ?  ?predniSONE 10 MG tablet ?Commonly known as: DELTASONE ?TAKE 4 TABS DAILY FOR 5 DAYS, THEN 3 TABS DAILY FOR 5 DAYS, THEN RESUME 2 TABS DAILY ?What changed: See the new instructions. ?  ?tiotropium 18 MCG inhalation capsule ?Commonly known as: SPIRIVA ?Place 1 capsule (18 mcg total) into inhaler and inhale daily. ?What changed: when to take this ?  ? ?  ? ? Follow-up Information   ? ? [facility]. Schedule an appointment as soon as possible for a visit in 1 week(s).   ?Why: Hospital Follow Up ?Contact information: ?[address] ?[place] [address] ?[phone] ? ? ?  ?  ? ? DR Marshell Levan. Schedule an appointment as soon as possible for a visit in 2 week(s).   ?Why: Hospital Follow Up ?Contact information: ?PULMONOLOGIST ? ?  ?  ? ?  ?  ? ?  ? ?Allergies  ?Allergen Reactions  ? Chlorhexidine Other (See Comments)  ?  Pt unsure of this allergy  ? ?Allergies as of 05/04/2021   ? ?   Reactions  ? Chlorhexidine Other (See Comments)  ? Pt unsure of this allergy  ? ?  ? ?  ?Medication List  ?  ? ?TAKE these medications   ? ?albuterol 108 (90 Base) MCG/ACT inhaler ?Commonly known as: VENTOLIN HFA ?Inhale 1-2 puffs into the lungs every 6 (six) hours as needed for wheezing or shortness of breath. ?  ?albuterol (2.5 MG/3ML) 0.083% nebulizer  solution ?Commonly known as: PROVENTIL ?Take 6 mLs (5 mg total) by nebulization every 4 (four) hours as needed for wheezing or shortness of breath. ?  ?aspirin EC 81 MG tablet ?Take 81 mg by mouth daily. Swallow whole. ?  ?azithromycin 500 MG tablet ?Commonly known as: ZITHROMAX ?TAKE 1 TAB PO DAILY ?Start taking on: May 05, 2021 ?  ?budesonide-formoterol 160-4.5 MCG/ACT inhaler ?Commonly known as: SYMBICORT ?Inhale 2 puffs into the lungs 2 (two) times daily. ?  ?diltiazem 180 MG 24 hr capsule ?Commonly known as: CARDIZEM CD ?Take 1 capsule (180 mg total) by mouth daily. ?  ?folic acid 1 MG tablet ?Commonly known as:  FOLVITE ?Take 1 tablet (1 mg total) by mouth daily. ?  ?guaiFENesin 600 MG 12 hr tablet ?Commonly known as: Mucinex ?Take 2 tablets (1,200 mg total) by mouth 2 (two) times daily for 5 days. ?  ?predniSONE 10 MG tablet ?Commonly known as: DELTASONE ?TAKE 4 TABS DAILY FOR 5 DAYS, THEN 3 TABS DAILY FOR 5 DAYS, THEN RESUME 2 TABS DAILY ?What changed: See the new instructions. ?  ?tiotropium 18 MCG inhalation capsule ?Commonly known as: SPIRIVA ?Place 1 capsule (18 mcg total) into inhaler and inhale daily. ?What changed: when to take this ?  ? ?  ? ? ?Procedures/Studies: ?DG Chest Port 1 View ? ?Result Date: 05/02/2021 ?CLINICAL DATA:  Shortness of breath EXAM: PORTABLE CHEST 1 VIEW COMPARISON:  12/08/2020 FINDINGS: Heart size and pulmonary vascularity are normal. Small esophageal hiatal hernia suggested behind the heart. Emphysematous changes and fibrosis throughout the lungs. Peribronchial thickening with suggestion of bronchiectasis in the bases. Bilateral apical scarring, greater on the right. No pleural effusions. No pneumothorax. No focal consolidation. IMPRESSION: Emphysematous and bronchitic changes in the lungs. Scattered fibrosis. No focal consolidation. Electronically Signed   By: Lucienne Capers M.D.   On: 05/02/2021 01:17   ?  ?Subjective: ?Pt says he feels at baseline and much better than when  presented for admission ? ?Discharge Exam: ?Vitals:  ? 05/04/21 0801 05/04/21 0827  ?BP:  123/73  ?Pulse:  70  ?Resp:  20  ?Temp:    ?SpO2: 99% 100%  ? ?Vitals:  ? 05/04/21 0527 05/04/21 0755 05/04/21 0801 0

## 2021-05-04 NOTE — Progress Notes (Signed)
Discharge instructions given patient verbalized understanding.

## 2021-05-04 NOTE — Discharge Instructions (Signed)
IMPORTANT INFORMATION: PAY CLOSE ATTENTION  ° °PHYSICIAN DISCHARGE INSTRUCTIONS ° °Follow with Primary care provider  [facility]  and other consultants as instructed by your Hospitalist Physician ° °SEEK MEDICAL CARE OR RETURN TO EMERGENCY ROOM IF SYMPTOMS COME BACK, WORSEN OR NEW PROBLEM DEVELOPS  ° °Please note: °You were cared for by a hospitalist during your hospital stay. Every effort will be made to forward records to your primary care provider.  You can request that your primary care provider send for your hospital records if they have not received them.  Once you are discharged, your primary care physician will handle any further medical issues. Please note that NO REFILLS for any discharge medications will be authorized once you are discharged, as it is imperative that you return to your primary care physician (or establish a relationship with a primary care physician if you do not have one) for your post hospital discharge needs so that they can reassess your need for medications and monitor your lab values. ° °Please get a complete blood count and chemistry panel checked by your Primary MD at your next visit, and again as instructed by your Primary MD. ° °Get Medicines reviewed and adjusted: °Please take all your medications with you for your next visit with your Primary MD ° °Laboratory/radiological data: °Please request your Primary MD to go over all hospital tests and procedure/radiological results at the follow up, please ask your primary care provider to get all Hospital records sent to his/her office. ° °In some cases, they will be blood work, cultures and biopsy results pending at the time of your discharge. Please request that your primary care provider follow up on these results. ° °If you are diabetic, please bring your blood sugar readings with you to your follow up appointment with primary care.   ° °Please call and make your follow up appointments as soon as possible.   ° °Also  Note the following: °If you experience worsening of your admission symptoms, develop shortness of breath, life threatening emergency, suicidal or homicidal thoughts you must seek medical attention immediately by calling 911 or calling your MD immediately  if symptoms less severe. ° °You must read complete instructions/literature along with all the possible adverse reactions/side effects for all the Medicines you take and that have been prescribed to you. Take any new Medicines after you have completely understood and accpet all the possible adverse reactions/side effects.  ° °Do not drive when taking Pain medications or sleeping medications (Benzodiazepines) ° °Do not take more than prescribed Pain, Sleep and Anxiety Medications. It is not advisable to combine anxiety,sleep and pain medications without talking with your primary care practitioner ° °Special Instructions: If you have smoked or chewed Tobacco  in the last 2 yrs please stop smoking, stop any regular Alcohol  and or any Recreational drug use. ° °Wear Seat belts while driving.  Do not drive if taking any narcotic, mind altering or controlled substances or recreational drugs or alcohol.  ° ° ° ° ° ° °

## 2021-05-28 ENCOUNTER — Telehealth: Payer: Self-pay | Admitting: Emergency Medicine

## 2021-05-28 NOTE — Telephone Encounter (Signed)
Left message for patient to call back  

## 2021-06-18 ENCOUNTER — Other Ambulatory Visit: Payer: Self-pay

## 2021-06-18 ENCOUNTER — Inpatient Hospital Stay (HOSPITAL_COMMUNITY)
Admission: EM | Admit: 2021-06-18 | Discharge: 2021-06-29 | DRG: 190 | Disposition: A | Discharge status: Hospice | Payer: No Typology Code available for payment source | Attending: Internal Medicine | Admitting: Internal Medicine

## 2021-06-18 ENCOUNTER — Encounter (HOSPITAL_COMMUNITY): Payer: Self-pay | Admitting: *Deleted

## 2021-06-18 ENCOUNTER — Emergency Department (HOSPITAL_COMMUNITY): Payer: No Typology Code available for payment source

## 2021-06-18 DIAGNOSIS — Z9981 Dependence on supplemental oxygen: Secondary | ICD-10-CM

## 2021-06-18 DIAGNOSIS — J9622 Acute and chronic respiratory failure with hypercapnia: Secondary | ICD-10-CM | POA: Diagnosis present

## 2021-06-18 DIAGNOSIS — I480 Paroxysmal atrial fibrillation: Secondary | ICD-10-CM | POA: Diagnosis not present

## 2021-06-18 DIAGNOSIS — R739 Hyperglycemia, unspecified: Secondary | ICD-10-CM | POA: Diagnosis present

## 2021-06-18 DIAGNOSIS — Z7951 Long term (current) use of inhaled steroids: Secondary | ICD-10-CM

## 2021-06-18 DIAGNOSIS — I471 Supraventricular tachycardia: Secondary | ICD-10-CM | POA: Diagnosis not present

## 2021-06-18 DIAGNOSIS — Z6827 Body mass index (BMI) 27.0-27.9, adult: Secondary | ICD-10-CM

## 2021-06-18 DIAGNOSIS — Z7982 Long term (current) use of aspirin: Secondary | ICD-10-CM

## 2021-06-18 DIAGNOSIS — J441 Chronic obstructive pulmonary disease with (acute) exacerbation: Principal | ICD-10-CM | POA: Diagnosis present

## 2021-06-18 DIAGNOSIS — Z87891 Personal history of nicotine dependence: Secondary | ICD-10-CM

## 2021-06-18 DIAGNOSIS — E782 Mixed hyperlipidemia: Secondary | ICD-10-CM | POA: Diagnosis present

## 2021-06-18 DIAGNOSIS — Z888 Allergy status to other drugs, medicaments and biological substances status: Secondary | ICD-10-CM

## 2021-06-18 DIAGNOSIS — J439 Emphysema, unspecified: Secondary | ICD-10-CM | POA: Diagnosis not present

## 2021-06-18 DIAGNOSIS — Z66 Do not resuscitate: Secondary | ICD-10-CM | POA: Diagnosis not present

## 2021-06-18 DIAGNOSIS — T380X5A Adverse effect of glucocorticoids and synthetic analogues, initial encounter: Secondary | ICD-10-CM | POA: Diagnosis not present

## 2021-06-18 DIAGNOSIS — R339 Retention of urine, unspecified: Secondary | ICD-10-CM | POA: Diagnosis not present

## 2021-06-18 DIAGNOSIS — K219 Gastro-esophageal reflux disease without esophagitis: Secondary | ICD-10-CM | POA: Diagnosis present

## 2021-06-18 DIAGNOSIS — I959 Hypotension, unspecified: Secondary | ICD-10-CM | POA: Diagnosis not present

## 2021-06-18 DIAGNOSIS — Z803 Family history of malignant neoplasm of breast: Secondary | ICD-10-CM

## 2021-06-18 DIAGNOSIS — Z79899 Other long term (current) drug therapy: Secondary | ICD-10-CM

## 2021-06-18 DIAGNOSIS — E663 Overweight: Secondary | ICD-10-CM | POA: Diagnosis present

## 2021-06-18 DIAGNOSIS — I110 Hypertensive heart disease with heart failure: Secondary | ICD-10-CM | POA: Diagnosis present

## 2021-06-18 DIAGNOSIS — F419 Anxiety disorder, unspecified: Secondary | ICD-10-CM | POA: Diagnosis not present

## 2021-06-18 DIAGNOSIS — I10 Essential (primary) hypertension: Secondary | ICD-10-CM | POA: Diagnosis present

## 2021-06-18 DIAGNOSIS — I5031 Acute diastolic (congestive) heart failure: Secondary | ICD-10-CM | POA: Diagnosis not present

## 2021-06-18 DIAGNOSIS — N179 Acute kidney failure, unspecified: Secondary | ICD-10-CM | POA: Diagnosis not present

## 2021-06-18 DIAGNOSIS — J9621 Acute and chronic respiratory failure with hypoxia: Secondary | ICD-10-CM | POA: Diagnosis present

## 2021-06-18 DIAGNOSIS — Z515 Encounter for palliative care: Secondary | ICD-10-CM

## 2021-06-18 DIAGNOSIS — Z7189 Other specified counseling: Secondary | ICD-10-CM

## 2021-06-18 DIAGNOSIS — Z7952 Long term (current) use of systemic steroids: Secondary | ICD-10-CM

## 2021-06-18 DIAGNOSIS — D72829 Elevated white blood cell count, unspecified: Secondary | ICD-10-CM | POA: Diagnosis present

## 2021-06-18 HISTORY — DX: Essential (primary) hypertension: I10

## 2021-06-18 HISTORY — DX: Mixed hyperlipidemia: E78.2

## 2021-06-18 HISTORY — DX: Gastro-esophageal reflux disease without esophagitis: K21.9

## 2021-06-18 LAB — COMPREHENSIVE METABOLIC PANEL
ALT: 22 U/L (ref 0–44)
AST: 25 U/L (ref 15–41)
Albumin: 3.7 g/dL (ref 3.5–5.0)
Alkaline Phosphatase: 63 U/L (ref 38–126)
Anion gap: 8 (ref 5–15)
BUN: 13 mg/dL (ref 8–23)
CO2: 31 mmol/L (ref 22–32)
Calcium: 8.9 mg/dL (ref 8.9–10.3)
Chloride: 100 mmol/L (ref 98–111)
Creatinine, Ser: 0.8 mg/dL (ref 0.61–1.24)
GFR, Estimated: >60 mL/min (ref >60)
Glucose, Bld: 162 mg/dL — ABNORMAL HIGH (ref 70–99)
Potassium: 4.4 mmol/L (ref 3.5–5.1)
Sodium: 139 mmol/L (ref 135–145)
Total Bilirubin: 0.7 mg/dL (ref 0.3–1.2)
Total Protein: 6.8 g/dL (ref 6.5–8.1)

## 2021-06-18 LAB — CBC WITH DIFFERENTIAL/PLATELET
Abs Immature Granulocytes: 0.14 10*3/uL — ABNORMAL HIGH (ref 0.00–0.07)
Basophils Absolute: 0 10*3/uL (ref 0.0–0.1)
Basophils Relative: 0 %
Eosinophils Absolute: 0 10*3/uL (ref 0.0–0.5)
Eosinophils Relative: 0 %
HCT: 45 % (ref 39.0–52.0)
Hemoglobin: 13.8 g/dL (ref 13.0–17.0)
Immature Granulocytes: 1 %
Lymphocytes Relative: 7 %
Lymphs Abs: 1 10*3/uL (ref 0.7–4.0)
MCH: 28.6 pg (ref 26.0–34.0)
MCHC: 30.7 g/dL (ref 30.0–36.0)
MCV: 93.4 fL (ref 80.0–100.0)
Monocytes Absolute: 0.9 10*3/uL (ref 0.1–1.0)
Monocytes Relative: 7 %
Neutro Abs: 12.1 10*3/uL — ABNORMAL HIGH (ref 1.7–7.7)
Neutrophils Relative %: 85 %
Platelets: 309 10*3/uL (ref 150–400)
RBC: 4.82 MIL/uL (ref 4.22–5.81)
RDW: 14.6 % (ref 11.5–15.5)
WBC: 14.3 10*3/uL — ABNORMAL HIGH (ref 4.0–10.5)
nRBC: 0 % (ref 0.0–0.2)

## 2021-06-18 LAB — BLOOD GAS, ARTERIAL
Acid-Base Excess: 9.9 mmol/L — ABNORMAL HIGH (ref 0.0–2.0)
Bicarbonate: 36.8 mmol/L — ABNORMAL HIGH (ref 20.0–28.0)
Drawn by: 22223
FIO2: 40 %
O2 Saturation: 100 %
Patient temperature: 37
pCO2 arterial: 58 mmHg — ABNORMAL HIGH (ref 32–48)
pH, Arterial: 7.41 (ref 7.35–7.45)
pO2, Arterial: 108 mmHg (ref 83–108)

## 2021-06-18 LAB — BRAIN NATRIURETIC PEPTIDE: B Natriuretic Peptide: 44 pg/mL (ref 0.0–100.0)

## 2021-06-18 MED ORDER — ADENOSINE 6 MG/2ML IV SOLN
INTRAVENOUS | Status: AC
  Filled 2021-06-18: qty 2

## 2021-06-18 MED ORDER — DM-GUAIFENESIN ER 30-600 MG PO TB12
1.0000 | ORAL_TABLET | Freq: Two times a day (BID) | ORAL | Status: DC
  Administered 2021-06-18 – 2021-06-28 (×19): 1 via ORAL
  Filled 2021-06-18 (×20): qty 1

## 2021-06-18 MED ORDER — PANTOPRAZOLE SODIUM 40 MG PO TBEC
40.0000 mg | DELAYED_RELEASE_TABLET | Freq: Every day | ORAL | Status: DC
  Administered 2021-06-18 – 2021-06-28 (×11): 40 mg via ORAL
  Filled 2021-06-18 (×11): qty 1

## 2021-06-18 MED ORDER — ADENOSINE 6 MG/2ML IV SOLN
6.0000 mg | Freq: Once | INTRAVENOUS | Status: DC
  Filled 2021-06-18: qty 2

## 2021-06-18 MED ORDER — ADENOSINE 6 MG/2ML IV SOLN
INTRAVENOUS | Status: AC
  Filled 2021-06-18: qty 4

## 2021-06-18 MED ORDER — AZITHROMYCIN 250 MG PO TABS
500.0000 mg | ORAL_TABLET | Freq: Every day | ORAL | Status: AC
  Administered 2021-06-18: 500 mg via ORAL
  Filled 2021-06-18: qty 2

## 2021-06-18 MED ORDER — METHYLPREDNISOLONE SODIUM SUCC 40 MG IJ SOLR: 40.0000 mg | Freq: Two times a day (BID) | INTRAMUSCULAR | Status: DC

## 2021-06-18 MED ORDER — ENOXAPARIN SODIUM 40 MG/0.4ML IJ SOSY
40.0000 mg | PREFILLED_SYRINGE | INTRAMUSCULAR | Status: DC
  Administered 2021-06-18 – 2021-06-21 (×4): 40 mg via SUBCUTANEOUS
  Filled 2021-06-18 (×4): qty 0.4

## 2021-06-18 MED ORDER — MOMETASONE FURO-FORMOTEROL FUM 200-5 MCG/ACT IN AERO
2.0000 | INHALATION_SPRAY | Freq: Two times a day (BID) | RESPIRATORY_TRACT | Status: DC
  Administered 2021-06-18: 2 via RESPIRATORY_TRACT
  Filled 2021-06-18: qty 8.8

## 2021-06-18 MED ORDER — DILTIAZEM HCL ER COATED BEADS 180 MG PO CP24
180.0000 mg | ORAL_CAPSULE | Freq: Every day | ORAL | Status: DC
  Administered 2021-06-19 – 2021-06-22 (×4): 180 mg via ORAL
  Filled 2021-06-18 (×4): qty 1

## 2021-06-18 MED ORDER — IPRATROPIUM-ALBUTEROL 0.5-2.5 (3) MG/3ML IN SOLN
RESPIRATORY_TRACT | Status: AC
  Filled 2021-06-18: qty 3

## 2021-06-18 MED ORDER — LEVALBUTEROL HCL 0.63 MG/3ML IN NEBU
0.6300 mg | INHALATION_SOLUTION | Freq: Four times a day (QID) | RESPIRATORY_TRACT | Status: DC
Start: 2021-06-18 — End: 2021-06-19
  Administered 2021-06-19: 0.63 mg via RESPIRATORY_TRACT
  Filled 2021-06-18: qty 3

## 2021-06-18 MED ORDER — ORAL CARE MOUTH RINSE
15.0000 mL | Freq: Two times a day (BID) | OROMUCOSAL | Status: DC
  Administered 2021-06-19 – 2021-06-26 (×12): 15 mL via OROMUCOSAL

## 2021-06-18 MED ORDER — AZITHROMYCIN 250 MG PO TABS
250.0000 mg | ORAL_TABLET | Freq: Every day | ORAL | Status: AC
  Administered 2021-06-19 – 2021-06-22 (×4): 250 mg via ORAL
  Filled 2021-06-18 (×4): qty 1

## 2021-06-18 NOTE — ED Triage Notes (Addendum)
Pt brought in by [facility] from home with c/o SOB at home starting mid day with no relief with Albuterol. Swelling x 3 days at home. Pt in SVT, rhythm regular, HR 150-160. Adenosine given which slowed the HR, but the regular was now irregular. Cardizem 25mg  IV given, rhythm now regular with HR 140. Pt arrived to ED with CPAP in place.

## 2021-06-18 NOTE — ED Notes (Signed)
Provider at bedside

## 2021-06-18 NOTE — ED Notes (Addendum)
Pt self converted HR. EDP made aware.  Pt more comfortable and relaxed. Pt playing on their phone. Pt still on bi-pap.  Will continue to monitor pt. Adenosine at bedside if needed per EDP

## 2021-06-18 NOTE — Progress Notes (Signed)
Per patient request (stated he "hates the BiPAP)", he was placed on 4 L/m [place]. SpO2 100%. Also explained to patient if vital signs where to get critical he would need to wear the BiPAP.

## 2021-06-18 NOTE — ED Notes (Signed)
EMS called back and reported they had Solumedrol 125mg  IV as well en route to the hospital. Dr. notified.

## 2021-06-18 NOTE — H&P (Signed)
History and Physical    Patient: Jacob Pace Convey [id number] DOB: July 15, 1950 DOA: 06/18/2021 DOS: the patient was seen and examined on 06/18/2021 PCP: [facility]  Patient coming from: Home  Chief Complaint:  Chief Complaint  Patient presents with   Shortness of Breath   HPI: Jacob Pace is a 71 y.o. male with medical history significant of essential hypertension, chronic respiratory failure on supplemental oxygen at 4 LPM and COPD who presents to the emergency department via EMS due to worsening shortness of breath which started last night.  Patient states that he used home albuterol without any improvement.  He activated EMS and on arrival of EMS team, his heart rate was noted to be 150, IV adenosine 6 mg x 1 was given without any effect, so Cardizem 25 mg was given without any improvement.  IV Solu-Medrol 25 mg x 1 was given and patient was placed on CPAP and transported to the ED for further evaluation and management.  ED Course:  In the emergency department, he was tachypneic and initially tachycardic, temperature was 97.70F, patient was transitioned to BiPAP.  Work-up in the ED showed normal CBC except for leukocytosis and normal BMP except for hyperglycemia.  BNP was 44. Chest x-ray showed COPD without evidence of superimposed acute cardiopulmonary process. Patient self converted from SVT to normal sinus rhythm prior to and another dose of adenosine being given in the ED.  Hospitalist was asked to admit patient for further evaluation and management.    Review of Systems: Review of systems as noted in the HPI. All other systems reviewed and are negative.   Past Medical History:  Diagnosis Date   COPD (chronic obstructive pulmonary disease) ([place])    Emphysema    Hypertension    Past Surgical History:  Procedure Laterality Date   HERNIA REPAIR     right inguinal hernia repair  2007    Social History:  reports that he quit smoking about 13 years ago. His  smoking use included cigarettes. He has a 40.00 pack-year smoking history. He has never used smokeless tobacco. He reports current alcohol use. He reports that he does not use drugs.   Allergies  Allergen Reactions   Chlorhexidine Other (See Comments)    Pt unsure of this allergy    Family History  Problem Relation Age of Onset   Cancer Father        unsure what type   Breast cancer Sister      Prior to Admission medications   Medication Sig Start Date End Date Taking? Authorizing Provider  albuterol (PROVENTIL) (2.5 MG/3ML) 0.083% nebulizer solution Take 6 mLs (5 mg total) by nebulization every 4 (four) hours as needed for wheezing or shortness of breath. 05/04/21   Johnson, Clanford L, MD  albuterol (VENTOLIN HFA) 108 (90 Base) MCG/ACT inhaler Inhale 1-2 puffs into the lungs every 6 (six) hours as needed for wheezing or shortness of breath.    [provider]  aspirin EC 81 MG tablet Take 81 mg by mouth daily. Swallow whole.    [provider]  azithromycin (ZITHROMAX) 500 MG tablet TAKE 1 TAB PO DAILY 05/05/21   Johnson, Clanford L, MD  budesonide-formoterol (SYMBICORT) 160-4.5 MCG/ACT inhaler Inhale 2 puffs into the lungs 2 (two) times daily. 09/25/20   Roxan Hockey, MD  diltiazem (CARDIZEM CD) 180 MG 24 hr capsule Take 1 capsule (180 mg total) by mouth daily. 09/25/20   Roxan Hockey, MD  folic acid (FOLVITE) 1  MG tablet Take 1 tablet (1 mg total) by mouth daily. 09/26/20   Roxan Hockey, MD  predniSONE (DELTASONE) 10 MG tablet TAKE 4 TABS DAILY FOR 5 DAYS, THEN 3 TABS DAILY FOR 5 DAYS, THEN RESUME 2 TABS DAILY 05/04/21   Wynetta Emery, Clanford L, MD  tiotropium (SPIRIVA) 18 MCG inhalation capsule Place 1 capsule (18 mcg total) into inhaler and inhale daily. Patient taking differently: Place 18 mcg into inhaler and inhale daily. 09/25/20   Roxan Hockey, MD    Physical Exam: BP 121/78   Pulse 88   Temp 98.1 F (36.7 C) (Axillary)   Resp 19   Ht 6\' 2"  (1.88 m)    Wt 103.1 kg   SpO2 99%   BMI 29.18 kg/m   General: 71 y.o. year-old male well developed well nourished, on BiPAP, but in no acute distress.  Alert and oriented x3. HEENT: NCAT, EOMI Neck: Supple, trachea medial Cardiovascular: Regular rate and rhythm with no rubs or gallops.  No thyromegaly or JVD noted.  No lower extremity edema. 2/4 pulses in all 4 extremities. Respiratory: Tachypnea.  Bilateral decreased breath sounds on auscultation with no wheezes or rales.  Abdomen: Soft, nontender nondistended with normal bowel sounds x4 quadrants. Muskuloskeletal: No cyanosis, clubbing or edema noted bilaterally Neuro: CN II-XII intact, strength 5/5 x 4, sensation, reflexes intact Skin: No ulcerative lesions noted or rashes Psychiatry: Judgement and insight appear normal. Mood is appropriate for condition and setting          Labs on Admission:  Basic Metabolic Panel: Recent Labs  Lab 06/18/21 1803  NA 139  K 4.4  CL 100  CO2 31  GLUCOSE 162*  BUN 13  CREATININE 0.80  CALCIUM 8.9   Liver Function Tests: Recent Labs  Lab 06/18/21 1803  AST 25  ALT 22  ALKPHOS 63  BILITOT 0.7  PROT 6.8  ALBUMIN 3.7   No results for input(s): LIPASE, AMYLASE in the last 168 hours. No results for input(s): AMMONIA in the last 168 hours. CBC: Recent Labs  Lab 06/18/21 1803  WBC 14.3*  NEUTROABS 12.1*  HGB 13.8  HCT 45.0  MCV 93.4  PLT 309   Cardiac Enzymes: No results for input(s): CKTOTAL, CKMB, CKMBINDEX, TROPONINI in the last 168 hours.  BNP (last 3 results) Recent Labs    10/21/20 0439 12/08/20 0004 06/18/21 1803  BNP 22.0 33.0 44.0    ProBNP (last 3 results) No results for input(s): PROBNP in the last 8760 hours.  CBG: No results for input(s): GLUCAP in the last 168 hours.  Radiological Exams on Admission: DG Chest Port 1 View  Result Date: 06/18/2021 CLINICAL DATA:  Shortness of breath EXAM: PORTABLE CHEST 1 VIEW COMPARISON:  05/02/2021 FINDINGS: Stable heart  size. Aortic atherosclerosis. Hyperinflated lungs with emphysematous changes in the upper lung fields. Chronically coarsened bibasilar interstitial markings. No superimposed airspace opacities identified. No pleural effusion or pneumothorax. IMPRESSION: COPD without evidence of superimposed acute cardiopulmonary process. Electronically Signed   By: Davina Poke D.O.   On: 06/18/2021 18:30    EKG: I independently viewed the EKG done and my findings are as followed:  Initial EKG showed supraventricular tachycardia at a rate of 149 bpm Second EKG showed SVT at a rate of 151 bpm 3rd EKG showed normal sinus rhythm at a rate of 88 bpm with PVCs  Assessment/Plan Present on Admission:  COPD exacerbation ([place])  Acute on chronic respiratory failure with hypoxia ([place])  Leukocytosis  Essential (primary) hypertension  Principal Problem:   COPD exacerbation ([place]) Active Problems:   Acute on chronic respiratory failure with hypoxia ([facility])   Essential (primary) hypertension   Leukocytosis   Hyperglycemia  Acute on chronic respiratory failure with hypoxia secondary to COPD exacerbation Continue Xopenex, Mucinex, Solu-Medrol, Dulera, azithromycin. Continue Protonix to prevent steroid-induced ulcer Continue incentive spirometry and flutter valve Continue BiPAP with goal to wean patient off these and transition to supplemental oxygen to maintain O2 sat > 94% as tolerated  Supraventricular tachycardia- resolved  Leukocytosis possibly reactive Hyperglycemia possibly reactive WBC 14.3, blood glucose 162, this may be due to steroid effect/stress demargination Continue to monitor WBC and blood glucose with morning labs  Essential hypertension (controlled) Continue diltiazem per home regimen  DVT prophylaxis: Lovenox   Advance Care Planning:   Code Status: Partial Code   Consults: None  Family Communication: None at bedside  Severity of Illness: The appropriate patient status for this patient  is OBSERVATION. Observation status is judged to be reasonable and necessary in order to provide the required intensity of service to ensure the patient's safety. The patient's presenting symptoms, physical exam findings, and initial radiographic and laboratory data in the context of their medical condition is felt to place them at decreased risk for further clinical deterioration. Furthermore, it is anticipated that the patient will be medically stable for discharge from the hospital within 2 midnights of admission.   Author: Bernadette Hoit, DO 06/18/2021 9:20 PM  For on call review [email or web address].

## 2021-06-18 NOTE — ED Provider Notes (Signed)
[facility] Provider Note   CSN: [id number] Arrival date & time: 06/18/21  1744     History  Chief Complaint  Patient presents with   Shortness of Breath    Jacob Pace is a 71 y.o. male.  Patient brought in by EMS.  For acute shortness of breath that happened about midday today.  Patient known to have COPD.  EMS mentioned that he had a history of CHF as well.  But past admissions have initially confirmed that.  Definitely has COPD and hypertension.  Patient does have albuterol inhalers.  Patient was on steroids after his most recent admission in April for COPD exacerbation.  Patient arrived on CPAP by EMS.  And EMS noted that his heart rate was around 150 they had given 6 mg of adenosine without much effect.  They gave him 25 mg of Cardizem without much effect.  He also got 125 mg of Solu-Medrol.  Patient normally on 4 L of oxygen at all times at home  Immediately upon arrival here we switched him over to BiPAP.  His heart rate was in the 150s we get a chest x-ray which was clear was prepping him to go ahead and redo the adenosine to see what the underlying rhythm was and patient spontaneously converted back to sinus rhythm with a heart rate of 80.  Patient also feeling much better on the BiPAP.  At one point in time BiPAP was removed but then he ran to some trouble again it was replaced.  Mentating very well feeling much better.      Home Medications Prior to Admission medications   Medication Sig Start Date End Date Taking? Authorizing Provider  albuterol (PROVENTIL) (2.5 MG/3ML) 0.083% nebulizer solution Take 6 mLs (5 mg total) by nebulization every 4 (four) hours as needed for wheezing or shortness of breath. 05/04/21   Johnson, Clanford L, MD  albuterol (VENTOLIN HFA) 108 (90 Base) MCG/ACT inhaler Inhale 1-2 puffs into the lungs every 6 (six) hours as needed for wheezing or shortness of breath.    [provider]  aspirin EC 81 MG tablet Take 81 mg  by mouth daily. Swallow whole.    [provider]  azithromycin (ZITHROMAX) 500 MG tablet TAKE 1 TAB PO DAILY 05/05/21   Johnson, Clanford L, MD  budesonide-formoterol (SYMBICORT) 160-4.5 MCG/ACT inhaler Inhale 2 puffs into the lungs 2 (two) times daily. 09/25/20   Roxan Hockey, MD  diltiazem (CARDIZEM CD) 180 MG 24 hr capsule Take 1 capsule (180 mg total) by mouth daily. 09/25/20   Roxan Hockey, MD  folic acid (FOLVITE) 1 MG tablet Take 1 tablet (1 mg total) by mouth daily. 09/26/20   Roxan Hockey, MD  predniSONE (DELTASONE) 10 MG tablet TAKE 4 TABS DAILY FOR 5 DAYS, THEN 3 TABS DAILY FOR 5 DAYS, THEN RESUME 2 TABS DAILY 05/04/21   Wynetta Emery, Clanford L, MD  tiotropium (SPIRIVA) 18 MCG inhalation capsule Place 1 capsule (18 mcg total) into inhaler and inhale daily. Patient taking differently: Place 18 mcg into inhaler and inhale daily. 09/25/20   Roxan Hockey, MD      Allergies    Chlorhexidine    Review of Systems   Review of Systems  Constitutional:  Negative for chills and fever.  HENT:  Negative for ear pain and sore throat.   Eyes:  Negative for pain and visual disturbance.  Respiratory:  Positive for shortness of breath. Negative for cough.   Cardiovascular:  Positive for leg  swelling. Negative for chest pain and palpitations.  Gastrointestinal:  Negative for abdominal pain and vomiting.  Genitourinary:  Negative for dysuria and hematuria.  Musculoskeletal:  Negative for arthralgias and back pain.  Skin:  Negative for color change and rash.  Neurological:  Negative for seizures and syncope.  All other systems reviewed and are negative.  Physical Exam Updated Vital Signs BP 113/88   Pulse 88   Temp (!) 97.5 F (36.4 C) (Axillary)   Resp (!) 23   Ht 1.88 m (6\' 2" )   Wt 103.1 kg   SpO2 97%   BMI 29.18 kg/m  Physical Exam Vitals and nursing note reviewed.  Constitutional:      General: He is in acute distress.     Appearance: Normal appearance. He is  well-developed. He is ill-appearing.  HENT:     Head: Normocephalic and atraumatic.  Eyes:     Extraocular Movements: Extraocular movements intact.     Conjunctiva/sclera: Conjunctivae normal.     Pupils: Pupils are equal, round, and reactive to light.  Cardiovascular:     Rate and Rhythm: Regular rhythm. Tachycardia present.     Heart sounds: No murmur heard. Pulmonary:     Effort: Respiratory distress present.     Breath sounds: No stridor. No wheezing, rhonchi or rales.     Comments: Decreased breath sounds bilaterally.  No wheezing Chest:     Chest wall: No tenderness.  Abdominal:     Palpations: Abdomen is soft.     Tenderness: There is no abdominal tenderness.  Musculoskeletal:        General: No swelling.     Cervical back: Neck supple.     Right lower leg: Edema present.     Left lower leg: Edema present.     Comments: Slight lower extremity edema bilaterally.  Skin:    General: Skin is warm and dry.     Capillary Refill: Capillary refill takes less than 2 seconds.  Neurological:     General: No focal deficit present.     Mental Status: He is alert and oriented to person, place, and time.     Cranial Nerves: No cranial nerve deficit.     Sensory: No sensory deficit.  Psychiatric:        Mood and Affect: Mood normal.    ED Results / Procedures / Treatments   Labs (all labs ordered are listed, but only abnormal results are displayed) Labs Reviewed  CBC WITH DIFFERENTIAL/PLATELET - Abnormal; Notable for the following components:      Result Value   WBC 14.3 (*)    Neutro Abs 12.1 (*)    Abs Immature Granulocytes 0.14 (*)    All other components within normal limits  COMPREHENSIVE METABOLIC PANEL - Abnormal; Notable for the following components:   Glucose, Bld 162 (*)    All other components within normal limits  BRAIN NATRIURETIC PEPTIDE  BLOOD GAS, ARTERIAL    EKG EKG Interpretation  Date/Time:  Friday Jun 18 2021 17:57:48 EDT Ventricular Rate:  149 PR  Interval:    QRS Duration: 84 QT Interval:  276 QTC Calculation: 434 R Axis:   82 Text Interpretation: Supraventricular tachycardia Otherwise normal ECG When compared with ECG of 02-May-2021 00:38, PREVIOUS ECG IS PRESENT Confirmed by Fredia Sorrow [phone]) on 06/18/2021 6:18:46 PM  Radiology DG Chest Port 1 View  Result Date: 06/18/2021 CLINICAL DATA:  Shortness of breath EXAM: PORTABLE CHEST 1 VIEW COMPARISON:  05/02/2021 FINDINGS: Stable heart size. Aortic  atherosclerosis. Hyperinflated lungs with emphysematous changes in the upper lung fields. Chronically coarsened bibasilar interstitial markings. No superimposed airspace opacities identified. No pleural effusion or pneumothorax. IMPRESSION: COPD without evidence of superimposed acute cardiopulmonary process. Electronically Signed   By: Davina Poke D.O.   On: 06/18/2021 18:30    Procedures Procedures    Medications Ordered in ED Medications  adenosine (ADENOCARD) 6 MG/2ML injection 6 mg (0 mg Intravenous Hold 06/18/21 1953)    ED Course/ Medical Decision Making/ A&P                           Medical Decision Making Amount and/or Complexity of Data Reviewed Labs: ordered. Radiology: ordered.  Risk Prescription drug management. Decision regarding hospitalization.   CRITICAL CARE Performed by: Fredia Sorrow Total critical care time: 45 minutes Critical care time was exclusive of separately billable procedures and treating other patients. Critical care was necessary to treat or prevent imminent or life-threatening deterioration. Critical care was time spent personally by me on the following activities: development of treatment plan with patient and/or surrogate as well as nursing, discussions with consultants, evaluation of patient's response to treatment, examination of patient, obtaining history from patient or surrogate, ordering and performing treatments and interventions, ordering and review of laboratory studies,  ordering and review of radiographic studies, pulse oximetry and re-evaluation of patient's condition.  Chest x-ray without any acute findings which was very reassuring.  Complete metabolic panel normal renal function.  LFTs and electrolytes without significant abnormality of the blood sugar 162.  White count was 14.3.  Hemoglobin 13.8.  BMP very reassuring at 69.  Patient as mentioned in the HPI had a rapid heart rate consistent with an SVT around 150.  Initially wanted to get him stabilized on the BiPAP and get a chest x-ray and he stayed in that rhythm.  We were prepping to go ahead and administer adenosine when he converted spontaneously to a sinus rhythm.  Patient not given any additional medications.  Already received Solu-Medrol 125 by EMS.  EMS also given of adenosine 6 mg and Cardizem 25 mg without significant change.  Patient normally on 4 L of oxygen at home.  Patient still needing the BiPAP.  We will contact the hospitalist for admission.  Arterial blood gases pending.  But patient is mentating so well.  That I doubt there is significant CO2 retention.    Final Clinical Impression(s) / ED Diagnoses Final diagnoses:  COPD exacerbation ([place])  SVT (supraventricular tachycardia) ([place])    Rx / DC Orders ED Discharge Orders     None         Fredia Sorrow, MD 06/18/21 2013

## 2021-06-19 DIAGNOSIS — Z888 Allergy status to other drugs, medicaments and biological substances status: Secondary | ICD-10-CM | POA: Diagnosis not present

## 2021-06-19 DIAGNOSIS — J9621 Acute and chronic respiratory failure with hypoxia: Secondary | ICD-10-CM | POA: Diagnosis present

## 2021-06-19 DIAGNOSIS — I110 Hypertensive heart disease with heart failure: Secondary | ICD-10-CM | POA: Diagnosis present

## 2021-06-19 DIAGNOSIS — Z7951 Long term (current) use of inhaled steroids: Secondary | ICD-10-CM | POA: Diagnosis not present

## 2021-06-19 DIAGNOSIS — J9622 Acute and chronic respiratory failure with hypercapnia: Secondary | ICD-10-CM | POA: Diagnosis present

## 2021-06-19 DIAGNOSIS — I5031 Acute diastolic (congestive) heart failure: Secondary | ICD-10-CM | POA: Diagnosis not present

## 2021-06-19 DIAGNOSIS — J439 Emphysema, unspecified: Secondary | ICD-10-CM | POA: Diagnosis present

## 2021-06-19 DIAGNOSIS — K219 Gastro-esophageal reflux disease without esophagitis: Secondary | ICD-10-CM | POA: Diagnosis present

## 2021-06-19 DIAGNOSIS — Z515 Encounter for palliative care: Secondary | ICD-10-CM | POA: Diagnosis not present

## 2021-06-19 DIAGNOSIS — I959 Hypotension, unspecified: Secondary | ICD-10-CM | POA: Diagnosis not present

## 2021-06-19 DIAGNOSIS — Z7189 Other specified counseling: Secondary | ICD-10-CM | POA: Diagnosis not present

## 2021-06-19 DIAGNOSIS — Z803 Family history of malignant neoplasm of breast: Secondary | ICD-10-CM | POA: Diagnosis not present

## 2021-06-19 DIAGNOSIS — J438 Other emphysema: Secondary | ICD-10-CM | POA: Diagnosis not present

## 2021-06-19 DIAGNOSIS — I4891 Unspecified atrial fibrillation: Secondary | ICD-10-CM | POA: Diagnosis not present

## 2021-06-19 DIAGNOSIS — I471 Supraventricular tachycardia: Secondary | ICD-10-CM | POA: Diagnosis present

## 2021-06-19 DIAGNOSIS — I10 Essential (primary) hypertension: Secondary | ICD-10-CM | POA: Diagnosis not present

## 2021-06-19 DIAGNOSIS — Z7982 Long term (current) use of aspirin: Secondary | ICD-10-CM | POA: Diagnosis not present

## 2021-06-19 DIAGNOSIS — I480 Paroxysmal atrial fibrillation: Secondary | ICD-10-CM | POA: Diagnosis not present

## 2021-06-19 DIAGNOSIS — T380X5A Adverse effect of glucocorticoids and synthetic analogues, initial encounter: Secondary | ICD-10-CM | POA: Diagnosis not present

## 2021-06-19 DIAGNOSIS — Z9981 Dependence on supplemental oxygen: Secondary | ICD-10-CM | POA: Diagnosis not present

## 2021-06-19 DIAGNOSIS — E782 Mixed hyperlipidemia: Secondary | ICD-10-CM | POA: Diagnosis present

## 2021-06-19 DIAGNOSIS — D72829 Elevated white blood cell count, unspecified: Secondary | ICD-10-CM | POA: Diagnosis present

## 2021-06-19 DIAGNOSIS — R739 Hyperglycemia, unspecified: Secondary | ICD-10-CM | POA: Diagnosis present

## 2021-06-19 DIAGNOSIS — Z87891 Personal history of nicotine dependence: Secondary | ICD-10-CM | POA: Diagnosis not present

## 2021-06-19 DIAGNOSIS — Z7952 Long term (current) use of systemic steroids: Secondary | ICD-10-CM | POA: Diagnosis not present

## 2021-06-19 DIAGNOSIS — J441 Chronic obstructive pulmonary disease with (acute) exacerbation: Secondary | ICD-10-CM | POA: Diagnosis present

## 2021-06-19 DIAGNOSIS — Z66 Do not resuscitate: Secondary | ICD-10-CM | POA: Diagnosis not present

## 2021-06-19 DIAGNOSIS — Z79899 Other long term (current) drug therapy: Secondary | ICD-10-CM | POA: Diagnosis not present

## 2021-06-19 DIAGNOSIS — N179 Acute kidney failure, unspecified: Secondary | ICD-10-CM | POA: Diagnosis not present

## 2021-06-19 LAB — CBC
HCT: 43.6 % (ref 39.0–52.0)
Hemoglobin: 13 g/dL (ref 13.0–17.0)
MCH: 28.3 pg (ref 26.0–34.0)
MCHC: 29.8 g/dL — ABNORMAL LOW (ref 30.0–36.0)
MCV: 94.8 fL (ref 80.0–100.0)
Platelets: 276 10*3/uL (ref 150–400)
RBC: 4.6 MIL/uL (ref 4.22–5.81)
RDW: 14.8 % (ref 11.5–15.5)
WBC: 15.7 10*3/uL — ABNORMAL HIGH (ref 4.0–10.5)
nRBC: 0 % (ref 0.0–0.2)

## 2021-06-19 LAB — COMPREHENSIVE METABOLIC PANEL
ALT: 22 U/L (ref 0–44)
AST: 23 U/L (ref 15–41)
Albumin: 3.4 g/dL — ABNORMAL LOW (ref 3.5–5.0)
Alkaline Phosphatase: 59 U/L (ref 38–126)
Anion gap: 8 (ref 5–15)
BUN: 15 mg/dL (ref 8–23)
CO2: 31 mmol/L (ref 22–32)
Calcium: 8.9 mg/dL (ref 8.9–10.3)
Chloride: 101 mmol/L (ref 98–111)
Creatinine, Ser: 0.85 mg/dL (ref 0.61–1.24)
GFR, Estimated: >60 mL/min (ref >60)
Glucose, Bld: 168 mg/dL — ABNORMAL HIGH (ref 70–99)
Potassium: 4.4 mmol/L (ref 3.5–5.1)
Sodium: 140 mmol/L (ref 135–145)
Total Bilirubin: 0.5 mg/dL (ref 0.3–1.2)
Total Protein: 6.5 g/dL (ref 6.5–8.1)

## 2021-06-19 LAB — APTT: aPTT: 29 seconds (ref 24–36)

## 2021-06-19 LAB — MAGNESIUM: Magnesium: 2.2 mg/dL (ref 1.7–2.4)

## 2021-06-19 LAB — PHOSPHORUS: Phosphorus: 4 mg/dL (ref 2.5–4.6)

## 2021-06-19 MED ORDER — BUDESONIDE 0.25 MG/2ML IN SUSP
0.2500 mg | Freq: Two times a day (BID) | RESPIRATORY_TRACT | Status: DC
  Administered 2021-06-19 – 2021-06-23 (×10): 0.25 mg via RESPIRATORY_TRACT
  Filled 2021-06-19 (×10): qty 2

## 2021-06-19 MED ORDER — CHLORHEXIDINE GLUCONATE CLOTH 2 % EX PADS
6.0000 | MEDICATED_PAD | Freq: Every day | CUTANEOUS | Status: DC
  Administered 2021-06-19 – 2021-06-23 (×5): 6 via TOPICAL

## 2021-06-19 MED ORDER — ALBUTEROL SULFATE (2.5 MG/3ML) 0.083% IN NEBU
2.5000 mg | INHALATION_SOLUTION | Freq: Four times a day (QID) | RESPIRATORY_TRACT | Status: DC
  Filled 2021-06-19: qty 3

## 2021-06-19 MED ORDER — METHYLPREDNISOLONE SODIUM SUCC 125 MG IJ SOLR
80.0000 mg | Freq: Two times a day (BID) | INTRAMUSCULAR | Status: DC
  Administered 2021-06-19 – 2021-06-20 (×4): 80 mg via INTRAVENOUS
  Filled 2021-06-19 (×4): qty 2

## 2021-06-19 MED ORDER — IPRATROPIUM-ALBUTEROL 0.5-2.5 (3) MG/3ML IN SOLN
3.0000 mL | Freq: Four times a day (QID) | RESPIRATORY_TRACT | Status: DC
  Administered 2021-06-19 – 2021-06-21 (×10): 3 mL via RESPIRATORY_TRACT
  Filled 2021-06-19 (×9): qty 3

## 2021-06-19 NOTE — Progress Notes (Signed)
PROGRESS NOTE    Jacob Pace  [id number] DOB: February 08, 1950 DOA: 06/18/2021 [facility]CP: [facility]   Brief Narrative:  Jacob Pace is a 71 y.o. male with medical history significant of essential hypertension, chronic respiratory failure on supplemental oxygen at 4 LPM and COPD who presents to the emergency department via EMS due to worsening shortness of breath which started last night.  He was admitted for acute on chronic hypoxemic respiratory failure secondary to COPD exacerbation and is currently requiring BiPAP support.  Assessment & Plan:   Principal Problem:   COPD exacerbation ([place]) Active Problems:   Acute on chronic respiratory failure with hypoxia (HCC)   Essential (primary) hypertension   Leukocytosis   Hyperglycemia  Assessment and Plan:  Acute on chronic respiratory failure with hypoxia secondary to COPD exacerbation Continue Xopenex, Mucinex, Solu-Medrol, Dulera, azithromycin. Solu-Medrol dose increased 5/20 Continue Protonix to prevent steroid-induced ulcer Continue incentive spirometry and flutter valve Continue BiPAP with goal to wean patient off these and transition to supplemental oxygen to maintain O2 sat > 94% as tolerated   Supraventricular tachycardia- resolved Adenosine used on admission   Leukocytosis possibly reactive Hyperglycemia possibly reactive WBC 14.3, blood glucose 162, this may be due to steroid effect/stress demargination Continue to monitor WBC and blood glucose with morning labs Check procalcitonin  Essential hypertension (controlled) Continue diltiazem per home regimen    DVT prophylaxis: Lovenox Code Status: DNI Family Communication: None at bedside Disposition Plan:  Status is: Observation The patient will require care spanning > 2 midnights and should be moved to inpatient because: Requiring IV medications and BiPAP support.    Consultants:  None  Procedures:  See below  Antimicrobials:   Anti-infectives (From admission, onward)    Start     Dose/Rate Route Frequency Ordered Stop   06/19/21 1000  azithromycin (ZITHROMAX) tablet 250 mg       See Hyperspace for full Linked Orders Report.   250 mg Oral Daily 06/18/21 2106 06/23/21 0959   06/18/21 2115  azithromycin (ZITHROMAX) tablet 500 mg       See Hyperspace for full Linked Orders Report.   500 mg Oral Daily 06/18/21 2106 06/18/21 2217      Subjective: Patient seen and evaluated today with ongoing shortness of breath and wheezing noted.  He is requiring BiPAP support.  Objective: Vitals:   06/19/21 0700 06/19/21 0842 06/19/21 0846 06/19/21 0902  BP:    (!) 153/91  Pulse: 84   82  Resp: (!) 23   (!) 23  Temp: (!) 96.5 F (35.8 C)     TempSrc: Axillary     SpO2: 100% 100% 100% 100%  Weight:      Height:        Intake/Output Summary (Last 24 hours) at 06/19/2021 1102 Last data filed at 06/19/2021 0600 Gross per 24 hour  Intake --  Output 450 ml  Net -450 ml   Filed Weights   06/18/21 1750 06/18/21 2130  Weight: 103.1 kg 101.7 kg    Examination:  General exam: Appears calm and comfortable  Respiratory system: Diminished bilaterally. Respiratory effort normal.  Currently on BiPAP Cardiovascular system: S1 & S2 heard, RRR.  Gastrointestinal system: Abdomen is soft Central nervous system: Alert and awake Extremities: No edema Skin: No significant lesions noted Psychiatry: Flat affect.    Data Reviewed: I have personally reviewed following labs and imaging studies  CBC: Recent Labs  Lab 06/18/21 1803 06/19/21 0129  WBC 14.3* 15.7*  NEUTROABS  12.1*  --   HGB 13.8 13.0  HCT 45.0 43.6  MCV 93.4 94.8  PLT 309 [id number]   Basic Metabolic Panel: Recent Labs  Lab 06/18/21 1803 06/19/21 0129  NA 139 140  K 4.4 4.4  CL 100 101  CO2 31 31  GLUCOSE 162* 168*  BUN 13 15  CREATININE 0.80 0.85  CALCIUM 8.9 8.9  MG  --  2.2  PHOS  --  4.0   GFR: Estimated Creatinine Clearance: 101.5 mL/min (by  C-G formula based on SCr of 0.85 mg/dL). Liver Function Tests: Recent Labs  Lab 06/18/21 1803 06/19/21 0129  AST 25 23  ALT 22 22  ALKPHOS 63 59  BILITOT 0.7 0.5  PROT 6.8 6.5  ALBUMIN 3.7 3.4*   No results for input(s): LIPASE, AMYLASE in the last 168 hours. No results for input(s): AMMONIA in the last 168 hours. Coagulation Profile: No results for input(s): INR, PROTIME in the last 168 hours. Cardiac Enzymes: No results for input(s): CKTOTAL, CKMB, CKMBINDEX, TROPONINI in the last 168 hours. BNP (last 3 results) No results for input(s): PROBNP in the last 8760 hours. HbA1C: No results for input(s): HGBA1C in the last 72 hours. CBG: No results for input(s): GLUCAP in the last 168 hours. Lipid Profile: No results for input(s): CHOL, HDL, LDLCALC, TRIG, CHOLHDL, LDLDIRECT in the last 72 hours. Thyroid Function Tests: No results for input(s): TSH, T4TOTAL, FREET4, T3FREE, THYROIDAB in the last 72 hours. Anemia Panel: No results for input(s): VITAMINB12, FOLATE, FERRITIN, TIBC, IRON, RETICCTPCT in the last 72 hours. Sepsis Labs: No results for input(s): PROCALCITON, LATICACIDVEN in the last 168 hours.  No results found for this or any previous visit (from the past 240 hour(s)).       Radiology Studies: DG Chest Port 1 View  Result Date: 06/18/2021 CLINICAL DATA:  Shortness of breath EXAM: PORTABLE CHEST 1 VIEW COMPARISON:  05/02/2021 FINDINGS: Stable heart size. Aortic atherosclerosis. Hyperinflated lungs with emphysematous changes in the upper lung fields. Chronically coarsened bibasilar interstitial markings. No superimposed airspace opacities identified. No pleural effusion or pneumothorax. IMPRESSION: COPD without evidence of superimposed acute cardiopulmonary process. Electronically Signed   By: Davina Poke D.O.   On: 06/18/2021 18:30        Scheduled Meds:  adenosine  6 mg Intravenous Once   azithromycin  250 mg Oral Daily   budesonide (PULMICORT) nebulizer  solution  0.25 mg Nebulization BID   Chlorhexidine Gluconate Cloth  6 each Topical Daily   dextromethorphan-guaiFENesin  1 tablet Oral BID   diltiazem  180 mg Oral Daily   enoxaparin (LOVENOX) injection  40 mg Subcutaneous Q24H   ipratropium-albuterol  3 mL Nebulization Q6H   mouth rinse  15 mL Mouth Rinse BID   methylPREDNISolone (SOLU-MEDROL) injection  80 mg Intravenous Q12H   pantoprazole  40 mg Oral Daily     LOS: 0 days    Time spent: 35 minutes    Antoney Biven Darleen Crocker, DO [facility]  If 7PM-7AM, please contact night-coverage [email or web address] 06/19/2021, 11:02 AM

## 2021-06-19 NOTE — Progress Notes (Signed)
Patient upon receiving xopenex stated left bad taste in mouth and coughing uncontrollable . Change back to albuterol

## 2021-06-20 DIAGNOSIS — J441 Chronic obstructive pulmonary disease with (acute) exacerbation: Secondary | ICD-10-CM | POA: Diagnosis not present

## 2021-06-20 LAB — CBC
HCT: 40.7 % (ref 39.0–52.0)
Hemoglobin: 12.7 g/dL — ABNORMAL LOW (ref 13.0–17.0)
MCH: 29.1 pg (ref 26.0–34.0)
MCHC: 31.2 g/dL (ref 30.0–36.0)
MCV: 93.1 fL (ref 80.0–100.0)
Platelets: 286 10*3/uL (ref 150–400)
RBC: 4.37 MIL/uL (ref 4.22–5.81)
RDW: 14.6 % (ref 11.5–15.5)
WBC: 23.9 10*3/uL — ABNORMAL HIGH (ref 4.0–10.5)
nRBC: 0 % (ref 0.0–0.2)

## 2021-06-20 LAB — BASIC METABOLIC PANEL
Anion gap: 8 (ref 5–15)
BUN: 20 mg/dL (ref 8–23)
CO2: 32 mmol/L (ref 22–32)
Calcium: 9.1 mg/dL (ref 8.9–10.3)
Chloride: 100 mmol/L (ref 98–111)
Creatinine, Ser: 0.87 mg/dL (ref 0.61–1.24)
GFR, Estimated: >60 mL/min (ref >60)
Glucose, Bld: 160 mg/dL — ABNORMAL HIGH (ref 70–99)
Potassium: 4.1 mmol/L (ref 3.5–5.1)
Sodium: 140 mmol/L (ref 135–145)

## 2021-06-20 LAB — MAGNESIUM: Magnesium: 2.2 mg/dL (ref 1.7–2.4)

## 2021-06-20 MED ORDER — IPRATROPIUM-ALBUTEROL 0.5-2.5 (3) MG/3ML IN SOLN
3.0000 mL | Freq: Four times a day (QID) | RESPIRATORY_TRACT | Status: DC | PRN
  Administered 2021-06-20 – 2021-06-21 (×2): 3 mL via RESPIRATORY_TRACT
  Filled 2021-06-20 (×2): qty 3

## 2021-06-20 NOTE — Progress Notes (Signed)
PROGRESS NOTE    Jacob Pace  [id number] DOB: May 10, 1950 DOA: 06/18/2021 [facility]: [facility]   Brief Narrative:    Jacob Pace is a 71 y.o. male with medical history significant of essential hypertension, chronic respiratory failure on supplemental oxygen at 4 LPM and COPD who presents to the emergency department via EMS due to worsening shortness of breath which started last night.  He was admitted for acute on chronic hypoxemic respiratory failure secondary to COPD exacerbation and is currently requiring BiPAP support.  Assessment & Plan:   Principal Problem:   COPD exacerbation ([place]) Active Problems:   Acute on chronic respiratory failure with hypoxia (HCC)   Essential (primary) hypertension   Leukocytosis   Hyperglycemia   COPD with acute exacerbation (HCC)  Assessment and Plan:   Acute on chronic respiratory failure with hypoxia secondary to COPD exacerbation Continue Xopenex, Mucinex, Solu-Medrol, Dulera, azithromycin. Solu-Medrol dose increased 5/20 Continue Protonix to prevent steroid-induced ulcer Continue incentive spirometry and flutter valve Continue BiPAP with goal to wean patient off these and transition to supplemental oxygen to maintain O2 sat > 94% as tolerated   Supraventricular tachycardia- resolved Adenosine used on admission   Leukocytosis possibly reactive Continues to climb due to steroid use Continue to monitor WBC and blood glucose with morning labs  Essential hypertension (controlled) Continue diltiazem per home regimen     DVT prophylaxis: Lovenox Code Status: DNI Family Communication: None at bedside Disposition Plan:  Status is: Inpatient Remains inpatient appropriate because: Need for ongoing breathing treatments and IV steroids.    Consultants:  None   Procedures:  See below   Antimicrobials:  Anti-infectives (From admission, onward)    Start     Dose/Rate Route Frequency Ordered Stop   06/19/21 1000   azithromycin (ZITHROMAX) tablet 250 mg       See Hyperspace for full Linked Orders Report.   250 mg Oral Daily 06/18/21 2106 06/23/21 0959   06/18/21 2115  azithromycin (ZITHROMAX) tablet 500 mg       See Hyperspace for full Linked Orders Report.   500 mg Oral Daily 06/18/21 2106 06/18/21 2217       Subjective: Patient seen and evaluated today with ongoing need for BiPAP support.  He seems to think that he is starting to breathe easier.  Objective: Vitals:   06/20/21 0726 06/20/21 0735 06/20/21 0800 06/20/21 0900  BP:   (!) 156/113 136/67  Pulse:   95 88  Resp:   (!) 25 (!) 22  Temp: (!) 97.5 F (36.4 C)     TempSrc: Axillary     SpO2:  99% 99% 98%  Weight:      Height:        Intake/Output Summary (Last 24 hours) at 06/20/2021 0953 Last data filed at 06/20/2021 0500 Gross per 24 hour  Intake 640 ml  Output 100 ml  Net 540 ml   Filed Weights   06/18/21 1750 06/18/21 2130 06/20/21 0500  Weight: 103.1 kg 101.7 kg 99.4 kg    Examination:  General exam: Appears calm and comfortable  Respiratory system: Clear to auscultation. Respiratory effort normal.  No wheezing noted. Currently on BiPAP Cardiovascular system: S1 & S2 heard, RRR.  Gastrointestinal system: Abdomen is soft Central nervous system: Alert and awake Extremities: No edema Skin: No significant lesions noted Psychiatry: Flat affect.    Data Reviewed: I have personally reviewed following labs and imaging studies  CBC: Recent Labs  Lab 06/18/21 1803 06/19/21 0129  06/20/21 0348  WBC 14.3* 15.7* 23.9*  NEUTROABS 12.1*  --   --   HGB 13.8 13.0 12.7*  HCT 45.0 43.6 40.7  MCV 93.4 94.8 93.1  PLT 309 276 [id number]   Basic Metabolic Panel: Recent Labs  Lab 06/18/21 1803 06/19/21 0129 06/20/21 0348  NA 139 140 140  K 4.4 4.4 4.1  CL 100 101 100  CO2 31 31 32  GLUCOSE 162* 168* 160*  BUN 13 15 20   CREATININE 0.80 0.85 0.87  CALCIUM 8.9 8.9 9.1  MG  --  2.2 2.2  PHOS  --  4.0  --     GFR: Estimated Creatinine Clearance: 98.1 mL/min (by C-G formula based on SCr of 0.87 mg/dL). Liver Function Tests: Recent Labs  Lab 06/18/21 1803 06/19/21 0129  AST 25 23  ALT 22 22  ALKPHOS 63 59  BILITOT 0.7 0.5  PROT 6.8 6.5  ALBUMIN 3.7 3.4*   No results for input(s): LIPASE, AMYLASE in the last 168 hours. No results for input(s): AMMONIA in the last 168 hours. Coagulation Profile: No results for input(s): INR, PROTIME in the last 168 hours. Cardiac Enzymes: No results for input(s): CKTOTAL, CKMB, CKMBINDEX, TROPONINI in the last 168 hours. BNP (last 3 results) No results for input(s): PROBNP in the last 8760 hours. HbA1C: No results for input(s): HGBA1C in the last 72 hours. CBG: No results for input(s): GLUCAP in the last 168 hours. Lipid Profile: No results for input(s): CHOL, HDL, LDLCALC, TRIG, CHOLHDL, LDLDIRECT in the last 72 hours. Thyroid Function Tests: No results for input(s): TSH, T4TOTAL, FREET4, T3FREE, THYROIDAB in the last 72 hours. Anemia Panel: No results for input(s): VITAMINB12, FOLATE, FERRITIN, TIBC, IRON, RETICCTPCT in the last 72 hours. Sepsis Labs: No results for input(s): PROCALCITON, LATICACIDVEN in the last 168 hours.  No results found for this or any previous visit (from the past 240 hour(s)).       Radiology Studies: DG Chest Port 1 View  Result Date: 06/18/2021 CLINICAL DATA:  Shortness of breath EXAM: PORTABLE CHEST 1 VIEW COMPARISON:  05/02/2021 FINDINGS: Stable heart size. Aortic atherosclerosis. Hyperinflated lungs with emphysematous changes in the upper lung fields. Chronically coarsened bibasilar interstitial markings. No superimposed airspace opacities identified. No pleural effusion or pneumothorax. IMPRESSION: COPD without evidence of superimposed acute cardiopulmonary process. Electronically Signed   By: Davina Poke D.O.   On: 06/18/2021 18:30        Scheduled Meds:  adenosine  6 mg Intravenous Once    azithromycin  250 mg Oral Daily   budesonide (PULMICORT) nebulizer solution  0.25 mg Nebulization BID   Chlorhexidine Gluconate Cloth  6 each Topical Daily   dextromethorphan-guaiFENesin  1 tablet Oral BID   diltiazem  180 mg Oral Daily   enoxaparin (LOVENOX) injection  40 mg Subcutaneous Q24H   ipratropium-albuterol  3 mL Nebulization Q6H   mouth rinse  15 mL Mouth Rinse BID   methylPREDNISolone (SOLU-MEDROL) injection  80 mg Intravenous Q12H   pantoprazole  40 mg Oral Daily     LOS: 1 day    Time spent: 35 minutes    Aulden Calise Darleen Crocker, DO [facility]  If 7PM-7AM, please contact night-coverage [email or web address] 06/20/2021, 9:53 AM

## 2021-06-20 NOTE — Progress Notes (Signed)
Patient would like to try sleeping with out BiPAP tonight. Will try

## 2021-06-21 DIAGNOSIS — I480 Paroxysmal atrial fibrillation: Secondary | ICD-10-CM

## 2021-06-21 DIAGNOSIS — J441 Chronic obstructive pulmonary disease with (acute) exacerbation: Secondary | ICD-10-CM | POA: Diagnosis not present

## 2021-06-21 LAB — CBC
HCT: 39.9 % (ref 39.0–52.0)
Hemoglobin: 11.8 g/dL — ABNORMAL LOW (ref 13.0–17.0)
MCH: 27.8 pg (ref 26.0–34.0)
MCHC: 29.6 g/dL — ABNORMAL LOW (ref 30.0–36.0)
MCV: 94.1 fL (ref 80.0–100.0)
Platelets: 252 10*3/uL (ref 150–400)
RBC: 4.24 MIL/uL (ref 4.22–5.81)
RDW: 14.2 % (ref 11.5–15.5)
WBC: 18.9 10*3/uL — ABNORMAL HIGH (ref 4.0–10.5)
nRBC: 0 % (ref 0.0–0.2)

## 2021-06-21 LAB — BASIC METABOLIC PANEL
Anion gap: 6 (ref 5–15)
BUN: 25 mg/dL — ABNORMAL HIGH (ref 8–23)
CO2: 34 mmol/L — ABNORMAL HIGH (ref 22–32)
Calcium: 8.8 mg/dL — ABNORMAL LOW (ref 8.9–10.3)
Chloride: 101 mmol/L (ref 98–111)
Creatinine, Ser: 0.91 mg/dL (ref 0.61–1.24)
GFR, Estimated: >60 mL/min (ref >60)
Glucose, Bld: 150 mg/dL — ABNORMAL HIGH (ref 70–99)
Potassium: 4.1 mmol/L (ref 3.5–5.1)
Sodium: 141 mmol/L (ref 135–145)

## 2021-06-21 LAB — MAGNESIUM: Magnesium: 2.4 mg/dL (ref 1.7–2.4)

## 2021-06-21 MED ORDER — TAMSULOSIN HCL 0.4 MG PO CAPS
0.4000 mg | ORAL_CAPSULE | Freq: Every day | ORAL | Status: DC
Start: 2021-06-21 — End: 2021-06-29
  Administered 2021-06-21 – 2021-06-28 (×8): 0.4 mg via ORAL
  Filled 2021-06-21 (×8): qty 1

## 2021-06-21 MED ORDER — ACETYLCYSTEINE 20 % IN SOLN
2.0000 mL | Freq: Three times a day (TID) | RESPIRATORY_TRACT | Status: DC
  Administered 2021-06-21 – 2021-06-22 (×6): 2 mL via RESPIRATORY_TRACT
  Filled 2021-06-21 (×6): qty 4

## 2021-06-21 MED ORDER — IPRATROPIUM BROMIDE 0.02 % IN SOLN
0.5000 mg | Freq: Four times a day (QID) | RESPIRATORY_TRACT | Status: DC
Start: 2021-06-22 — End: 2021-06-22

## 2021-06-21 MED ORDER — METHYLPREDNISOLONE SODIUM SUCC 125 MG IJ SOLR
60.0000 mg | Freq: Two times a day (BID) | INTRAMUSCULAR | Status: DC
  Administered 2021-06-21 (×2): 60 mg via INTRAVENOUS
  Filled 2021-06-21 (×2): qty 2

## 2021-06-21 MED ORDER — ACETAMINOPHEN 325 MG PO TABS
650.0000 mg | ORAL_TABLET | Freq: Four times a day (QID) | ORAL | Status: DC | PRN
  Administered 2021-06-21: 650 mg via ORAL
  Filled 2021-06-21: qty 2

## 2021-06-21 MED ORDER — DILTIAZEM HCL 25 MG/5ML IV SOLN
5.0000 mg | Freq: Once | INTRAVENOUS | Status: AC
  Administered 2021-06-21: 5 mg via INTRAVENOUS
  Filled 2021-06-21: qty 5

## 2021-06-21 NOTE — Progress Notes (Signed)
Patient has so far been able to stay off BiPAP

## 2021-06-21 NOTE — TOC Progression Note (Signed)
Transition of Care [facility]) - Progression Note    Patient Details  Name: Jacob Pace MRN: [id number] Date of Birth: Jan 11, 1951  Transition of Care [facility]) CM/SW Contact  Salome Arnt, [place] Phone Number: 06/21/2021, 10:05 AM  Clinical Narrative:   Transition of Care [facility]) Screening Note   Patient Details  Name: Jacob Pace Date of Birth: [birth year]   Transition of Care [facility]) CM/SW Contact:    Salome Arnt, [place] Phone Number: 06/21/2021, 10:05 AM    Transition of Care Department [facility]) has reviewed patient and no TOC needs have been identified at this time. We will continue to monitor patient advancement through interdisciplinary progression rounds. If new patient transition needs arise, please place a TOC consult.         Barriers to Discharge: Continued Medical Work up  Expected Discharge Plan and Services                                                 Social Determinants of Health (SDOH) Interventions    Readmission Risk Interventions     View : No data to display.

## 2021-06-21 NOTE — Progress Notes (Signed)
PROGRESS NOTE    Jacob Pace  [id number] DOB: Apr 22, 1950 DOA: 06/18/2021 PCP: [facility]   Brief Narrative:    Jacob Pace is a 71 y.o. male with medical history significant of essential hypertension, chronic respiratory failure on supplemental oxygen at 4 LPM and COPD who presents to the emergency department via EMS due to worsening shortness of breath which started last night.  He was admitted for acute on chronic hypoxemic respiratory failure secondary to COPD exacerbation and is slowly improving and has not required any further BiPAP support.  Assessment & Plan:   Principal Problem:   COPD exacerbation ([place]) Active Problems:   Acute on chronic respiratory failure with hypoxia (HCC)   Essential (primary) hypertension   Leukocytosis   Hyperglycemia   COPD with acute exacerbation (HCC)  Assessment and Plan:   Acute on chronic respiratory failure with hypoxia secondary to COPD exacerbation Continue Xopenex, Mucinex, Solu-Medrol, Dulera, azithromycin. Solu-Medrol dose increased 5/20 Continue Protonix to prevent steroid-induced ulcer Continue incentive spirometry and flutter valve Continue nasal cannula and wears 4 L at home Wean Solu-Medrol and maintain on breathing treatments for now Okay to transfer to telemetry Mucomyst today   Supraventricular tachycardia- resolved Adenosine used on admission   Leukocytosis possibly reactive Continues to climb due to steroid use Continue to monitor WBC and blood glucose with morning labs  Essential hypertension (controlled) Continue diltiazem per home regimen     DVT prophylaxis: Lovenox Code Status: DNI Family Communication: None at bedside Disposition Plan:  Status is: Inpatient Remains inpatient appropriate because: Need for ongoing breathing treatments and IV steroids.     Consultants:  None   Procedures:  See below   Antimicrobials:  Anti-infectives (From admission, onward)    Start      Dose/Rate Route Frequency Ordered Stop   06/19/21 1000  azithromycin (ZITHROMAX) tablet 250 mg       See Hyperspace for full Linked Orders Report.   250 mg Oral Daily 06/18/21 2106 06/23/21 0959   06/18/21 2115  azithromycin (ZITHROMAX) tablet 500 mg       See Hyperspace for full Linked Orders Report.   500 mg Oral Daily 06/18/21 2106 06/18/21 2217       Subjective: Patient seen and evaluated today with improving shortness of breath noted.  He continues to have some chest congestion.  He has remained off BiPAP overnight.  Objective: Vitals:   06/21/21 0700 06/21/21 0749 06/21/21 0800 06/21/21 0926  BP:   (!) 135/93   Pulse:   85   Resp:   (!) 22   Temp: 97.9 F (36.6 C)     TempSrc: Oral     SpO2:  97% 100% 99%  Weight:      Height:        Intake/Output Summary (Last 24 hours) at 06/21/2021 1034 Last data filed at 06/21/2021 0600 Gross per 24 hour  Intake 240 ml  Output 1050 ml  Net -810 ml   Filed Weights   06/18/21 2130 06/20/21 0500 06/21/21 0400  Weight: 101.7 kg 99.4 kg 100.6 kg    Examination:  General exam: Appears calm and comfortable  Respiratory system: Clear to auscultation, minimally diminished with congestion noted. Respiratory effort normal.  Currently on 5 L nasal cannula Cardiovascular system: S1 & S2 heard, RRR.  Gastrointestinal system: Abdomen is soft Central nervous system: Alert and awake Extremities: No edema Skin: No significant lesions noted Psychiatry: Flat affect.    Data Reviewed: I have personally  reviewed following labs and imaging studies  CBC: Recent Labs  Lab 06/18/21 1803 06/19/21 0129 06/20/21 0348 06/21/21 0354  WBC 14.3* 15.7* 23.9* 18.9*  NEUTROABS 12.1*  --   --   --   HGB 13.8 13.0 12.7* 11.8*  HCT 45.0 43.6 40.7 39.9  MCV 93.4 94.8 93.1 94.1  PLT 309 276 286 [id number]   Basic Metabolic Panel: Recent Labs  Lab 06/18/21 1803 06/19/21 0129 06/20/21 0348 06/21/21 0354  NA 139 140 140 141  K 4.4 4.4 4.1 4.1  CL  100 101 100 101  CO2 31 31 32 34*  GLUCOSE 162* 168* 160* 150*  BUN 13 15 20  25*  CREATININE 0.80 0.85 0.87 0.91  CALCIUM 8.9 8.9 9.1 8.8*  MG  --  2.2 2.2 2.4  PHOS  --  4.0  --   --    GFR: Estimated Creatinine Clearance: 94.4 mL/min (by C-G formula based on SCr of 0.91 mg/dL). Liver Function Tests: Recent Labs  Lab 06/18/21 1803 06/19/21 0129  AST 25 23  ALT 22 22  ALKPHOS 63 59  BILITOT 0.7 0.5  PROT 6.8 6.5  ALBUMIN 3.7 3.4*   No results for input(s): LIPASE, AMYLASE in the last 168 hours. No results for input(s): AMMONIA in the last 168 hours. Coagulation Profile: No results for input(s): INR, PROTIME in the last 168 hours. Cardiac Enzymes: No results for input(s): CKTOTAL, CKMB, CKMBINDEX, TROPONINI in the last 168 hours. BNP (last 3 results) No results for input(s): PROBNP in the last 8760 hours. HbA1C: No results for input(s): HGBA1C in the last 72 hours. CBG: No results for input(s): GLUCAP in the last 168 hours. Lipid Profile: No results for input(s): CHOL, HDL, LDLCALC, TRIG, CHOLHDL, LDLDIRECT in the last 72 hours. Thyroid Function Tests: No results for input(s): TSH, T4TOTAL, FREET4, T3FREE, THYROIDAB in the last 72 hours. Anemia Panel: No results for input(s): VITAMINB12, FOLATE, FERRITIN, TIBC, IRON, RETICCTPCT in the last 72 hours. Sepsis Labs: No results for input(s): PROCALCITON, LATICACIDVEN in the last 168 hours.  No results found for this or any previous visit (from the past 240 hour(s)).       Radiology Studies: No results found.      Scheduled Meds:  acetylcysteine  2 mL Nebulization TID   adenosine  6 mg Intravenous Once   azithromycin  250 mg Oral Daily   budesonide (PULMICORT) nebulizer solution  0.25 mg Nebulization BID   Chlorhexidine Gluconate Cloth  6 each Topical Daily   dextromethorphan-guaiFENesin  1 tablet Oral BID   diltiazem  180 mg Oral Daily   enoxaparin (LOVENOX) injection  40 mg Subcutaneous Q24H    ipratropium-albuterol  3 mL Nebulization Q6H   mouth rinse  15 mL Mouth Rinse BID   methylPREDNISolone (SOLU-MEDROL) injection  60 mg Intravenous Q12H   pantoprazole  40 mg Oral Daily   tamsulosin  0.4 mg Oral QPC supper     LOS: 2 days    Time spent: 35 minutes    [name], DO [facility]  If 7PM-7AM, please contact night-coverage [email or web address] 06/21/2021, 10:34 AM

## 2021-06-21 NOTE — Progress Notes (Signed)
Patient transferred from ICU , VSS, patient is alert and oriented and able to make needs known, call bell within reach, patient educated on how to call nurse and change channels on the remote. Tele box 17 on patient.    Report called from [facility] [place], [place]    06/21/21 1149  Vitals  Temp 97.6 F (36.4 C)  Temp Source Oral  BP (!) 150/87  MAP (mmHg) 105  BP Location Right Arm  BP Method Automatic  Patient Position (if appropriate) Lying  Pulse Rate 79  Pulse Rate Source Monitor  Resp 19  MEWS COLOR  MEWS Score Color Green  Oxygen Therapy  SpO2 100 %  O2 Device Nasal Cannula  O2 Flow Rate (L/min) 5 L/min  MEWS Score  MEWS Temp 0  MEWS Systolic 0  MEWS Pulse 0  MEWS RR 0  MEWS LOC 0  MEWS Score 0

## 2021-06-21 NOTE — Progress Notes (Signed)
RN called due to pt having A-Fib with RVR. Pt states that his heart started to race after the breathing treatment with Duoneb. At bedside, he presents with irregularly rate and rhythm and expiratory wheeze on auscultation EKG personally reviewed showed A-Fib with RVR. Pt has no prior history of this. IV cardizem 5mg  x 1 was given.  We shall continue to monitor pt. to decide if he needs to be placed on rate control drip and possibly anti-coagulant, if he continues to stay in AFIB.  Total time:  12 minutes This includes time reviewing the chart including progress notes, labs, EKGs, taking medical decisions, ordering labs and documenting findings.

## 2021-06-22 ENCOUNTER — Encounter (HOSPITAL_COMMUNITY): Payer: Self-pay | Admitting: Internal Medicine

## 2021-06-22 DIAGNOSIS — I4891 Unspecified atrial fibrillation: Secondary | ICD-10-CM | POA: Diagnosis not present

## 2021-06-22 DIAGNOSIS — J9621 Acute and chronic respiratory failure with hypoxia: Secondary | ICD-10-CM | POA: Diagnosis not present

## 2021-06-22 DIAGNOSIS — J441 Chronic obstructive pulmonary disease with (acute) exacerbation: Secondary | ICD-10-CM | POA: Diagnosis not present

## 2021-06-22 DIAGNOSIS — I10 Essential (primary) hypertension: Secondary | ICD-10-CM

## 2021-06-22 LAB — CBC
HCT: 41.6 % (ref 39.0–52.0)
Hemoglobin: 12.7 g/dL — ABNORMAL LOW (ref 13.0–17.0)
MCH: 28.7 pg (ref 26.0–34.0)
MCHC: 30.5 g/dL (ref 30.0–36.0)
MCV: 93.9 fL (ref 80.0–100.0)
Platelets: 262 10*3/uL (ref 150–400)
RBC: 4.43 MIL/uL (ref 4.22–5.81)
RDW: 14.2 % (ref 11.5–15.5)
WBC: 15.7 10*3/uL — ABNORMAL HIGH (ref 4.0–10.5)
nRBC: 0 % (ref 0.0–0.2)

## 2021-06-22 LAB — BASIC METABOLIC PANEL
Anion gap: 8 (ref 5–15)
BUN: 28 mg/dL — ABNORMAL HIGH (ref 8–23)
CO2: 32 mmol/L (ref 22–32)
Calcium: 8.7 mg/dL — ABNORMAL LOW (ref 8.9–10.3)
Chloride: 99 mmol/L (ref 98–111)
Creatinine, Ser: 0.96 mg/dL (ref 0.61–1.24)
GFR, Estimated: >60 mL/min (ref >60)
Glucose, Bld: 170 mg/dL — ABNORMAL HIGH (ref 70–99)
Potassium: 3.8 mmol/L (ref 3.5–5.1)
Sodium: 139 mmol/L (ref 135–145)

## 2021-06-22 LAB — TSH: TSH: 0.4 u[IU]/mL (ref 0.350–4.500)

## 2021-06-22 LAB — MRSA NEXT GEN BY PCR, NASAL: MRSA by PCR Next Gen: NOT DETECTED

## 2021-06-22 LAB — MAGNESIUM: Magnesium: 2.5 mg/dL — ABNORMAL HIGH (ref 1.7–2.4)

## 2021-06-22 MED ORDER — IPRATROPIUM BROMIDE 0.02 % IN SOLN
0.5000 mg | Freq: Three times a day (TID) | RESPIRATORY_TRACT | Status: DC
  Administered 2021-06-22 – 2021-06-24 (×8): 0.5 mg via RESPIRATORY_TRACT
  Filled 2021-06-22 (×8): qty 2.5

## 2021-06-22 MED ORDER — ORAL CARE MOUTH RINSE
15.0000 mL | Freq: Two times a day (BID) | OROMUCOSAL | Status: DC
  Administered 2021-06-22 – 2021-06-26 (×6): 15 mL via OROMUCOSAL

## 2021-06-22 MED ORDER — METHYLPREDNISOLONE SODIUM SUCC 125 MG IJ SOLR
80.0000 mg | Freq: Two times a day (BID) | INTRAMUSCULAR | Status: DC
Start: 2021-06-22 — End: 2021-06-26
  Administered 2021-06-22 – 2021-06-26 (×8): 80 mg via INTRAVENOUS
  Filled 2021-06-22 (×9): qty 2

## 2021-06-22 MED ORDER — LEVALBUTEROL HCL 0.63 MG/3ML IN NEBU
0.6300 mg | INHALATION_SOLUTION | Freq: Four times a day (QID) | RESPIRATORY_TRACT | Status: DC | PRN
  Administered 2021-06-22: 0.63 mg via RESPIRATORY_TRACT
  Filled 2021-06-22: qty 3

## 2021-06-22 MED ORDER — ENOXAPARIN SODIUM 60 MG/0.6ML IJ SOSY
60.0000 mg | PREFILLED_SYRINGE | Freq: Once | INTRAMUSCULAR | Status: AC
  Administered 2021-06-22: 60 mg via SUBCUTANEOUS
  Filled 2021-06-22: qty 0.6

## 2021-06-22 MED ORDER — AMIODARONE HCL IN DEXTROSE 360-4.14 MG/200ML-% IV SOLN
60.0000 mg/h | INTRAVENOUS | Status: AC
Start: 2021-06-22 — End: 2021-06-22
  Administered 2021-06-22: 60 mg/h via INTRAVENOUS
  Filled 2021-06-22 (×2): qty 200

## 2021-06-22 MED ORDER — ADENOSINE 6 MG/2ML IV SOLN: 6.0000 mg | Freq: Once | INTRAVENOUS | Status: DC

## 2021-06-22 MED ORDER — METHYLPREDNISOLONE SODIUM SUCC 125 MG IJ SOLR
125.0000 mg | Freq: Once | INTRAMUSCULAR | Status: AC
  Administered 2021-06-22: 125 mg via INTRAVENOUS
  Filled 2021-06-22: qty 2

## 2021-06-22 MED ORDER — APIXABAN 5 MG PO TABS
5.0000 mg | ORAL_TABLET | Freq: Two times a day (BID) | ORAL | Status: DC
  Administered 2021-06-22 – 2021-06-28 (×13): 5 mg via ORAL
  Filled 2021-06-22 (×14): qty 1

## 2021-06-22 MED ORDER — ENOXAPARIN SODIUM 100 MG/ML IJ SOSY
100.0000 mg | PREFILLED_SYRINGE | Freq: Two times a day (BID) | INTRAMUSCULAR | Status: DC
  Filled 2021-06-22: qty 1

## 2021-06-22 MED ORDER — ADENOSINE 6 MG/2ML IV SOLN: 3.0000 mg | Freq: Once | INTRAVENOUS | Status: DC

## 2021-06-22 MED ORDER — LEVALBUTEROL HCL 1.25 MG/0.5ML IN NEBU
1.2500 mg | INHALATION_SOLUTION | Freq: Three times a day (TID) | RESPIRATORY_TRACT | Status: DC
  Administered 2021-06-22 – 2021-06-28 (×13): 1.25 mg via RESPIRATORY_TRACT
  Filled 2021-06-22 (×14): qty 0.5

## 2021-06-22 MED ORDER — ENOXAPARIN SODIUM 100 MG/ML IJ SOSY: 100.0000 mg | PREFILLED_SYRINGE | Freq: Two times a day (BID) | INTRAMUSCULAR | Status: DC

## 2021-06-22 MED ORDER — AMIODARONE HCL IN DEXTROSE 360-4.14 MG/200ML-% IV SOLN
30.0000 mg/h | INTRAVENOUS | Status: DC
  Administered 2021-06-22 – 2021-06-23 (×3): 30 mg/h via INTRAVENOUS
  Filled 2021-06-22 (×5): qty 200

## 2021-06-22 NOTE — Progress Notes (Addendum)
ANTICOAGULATION CONSULT NOTE - Initial Consult  Pharmacy Consult for Lovenox Indication: atrial fibrillation  No Active Allergies  Patient Measurements: Height: 6\' 2"  (188 cm) Weight: 100.6 kg (221 lb 12.5 oz) IBW/kg (Calculated) : 82.2  Vital Signs: Temp: 97.7 F (36.5 C) (05/22 2350) Temp Source: Oral (05/22 2350) BP: 105/93 (05/22 2350) Pulse Rate: 151 (05/22 2350)  Labs: Recent Labs    06/19/21 0129 06/20/21 0348 06/21/21 0354  HGB 13.0 12.7* 11.8*  HCT 43.6 40.7 39.9  PLT 276 286 252  APTT 29  --   --   CREATININE 0.85 0.87 0.91    Estimated Creatinine Clearance: 94.4 mL/min (by C-G formula based on SCr of 0.91 mg/dL).   Medical History: Past Medical History:  Diagnosis Date   COPD (chronic obstructive pulmonary disease) (HCC)    Emphysema    Hypertension     Medications:  Medications Prior to Admission  Medication Sig Dispense Refill Last Dose   albuterol (PROVENTIL) (2.5 MG/3ML) 0.083% nebulizer solution Take 6 mLs (5 mg total) by nebulization every 4 (four) hours as needed for wheezing or shortness of breath. 75 mL 2    albuterol (VENTOLIN HFA) 108 (90 Base) MCG/ACT inhaler Inhale 1-2 puffs into the lungs every 6 (six) hours as needed for wheezing or shortness of breath.      aspirin EC 81 MG tablet Take 81 mg by mouth daily. Swallow whole.      azithromycin (ZITHROMAX) 500 MG tablet TAKE 1 TAB PO DAILY 4 tablet 0    budesonide-formoterol (SYMBICORT) 160-4.5 MCG/ACT inhaler Inhale 2 puffs into the lungs 2 (two) times daily. 1 each 12    diltiazem (CARDIZEM CD) 180 MG 24 hr capsule Take 1 capsule (180 mg total) by mouth daily. 30 capsule 5    folic acid (FOLVITE) 1 MG tablet Take 1 tablet (1 mg total) by mouth daily. 30 tablet 3    predniSONE (DELTASONE) 10 MG tablet TAKE 4 TABS DAILY FOR 5 DAYS, THEN 3 TABS DAILY FOR 5 DAYS, THEN RESUME 2 TABS DAILY 60 tablet 1    tiotropium (SPIRIVA) 18 MCG inhalation capsule Place 1 capsule (18 mcg total) into inhaler  and inhale daily. (Patient taking differently: Place 18 mcg into inhaler and inhale daily.) 30 capsule 5     Assessment: 71 y.o. male with new onset Afib for Lovenox.  Lovenox 40 mg SQ given ~ 10 pm for DVT prophylaxis Goal of Therapy:  Fill anticoagulation with Lovenox Monitor platelets by anticoagulation protocol: Yes   Plan: Lovenox 60 mg SQ now, then Lovenox 100 mg SQ q12h  62 06/22/2021,12:10 AM

## 2021-06-22 NOTE — Progress Notes (Signed)
PROGRESS NOTE    Jacob Pace  [id number] DOB: September 11, 1950 DOA: 06/18/2021 [facility]: [facility]   Brief Narrative:    Jacob Pace is a 71 y.o. male with medical history significant of essential hypertension, chronic respiratory failure on supplemental oxygen at 4 LPM and COPD who presents to the emergency department via EMS due to worsening shortness of breath which started last night.  He was admitted for acute on chronic hypoxemic respiratory failure secondary to COPD exacerbation.  He was showing signs of improvement, but overnight on 5/22 he went into new onset atrial fibrillation with RVR and was started on amiodarone infusion.  Assessment & Plan:   Principal Problem:   COPD exacerbation ([place]) Active Problems:   Acute on chronic respiratory failure with hypoxia (HCC)   Essential (primary) hypertension   Leukocytosis   Hyperglycemia   COPD with acute exacerbation (HCC)  Assessment and Plan:   Acute on chronic respiratory failure with hypoxia secondary to COPD exacerbation Continue Xopenex, Mucinex, Solu-Medrol, Dulera, azithromycin. Solu-Medrol dose increased 5/20 Continue Protonix to prevent steroid-induced ulcer Continue incentive spirometry and flutter valve Continue nasal cannula and wears 4 L at home Breathing treatments to Xopenex to avoid aggravation of rate control Continues to have significant bronchospasms, increase Solu-Medrol dose Mucomyst today along with chest physiotherapy per respiratory to help with secretions   Supraventricular tachycardia- resolved Adenosine used on admission  New onset atrial fibrillation with RVR Continue amiodarone infusion Eliquis for anticoagulation Continue home Cardizem Check TSH Appreciate further cardiology recommendations   Leukocytosis possibly reactive Continues to climb due to steroid use Continue to monitor WBC and blood glucose with morning labs  Essential hypertension (controlled) Continue  diltiazem per home regimen     DVT prophylaxis: Eliquis Code Status: DNI Family Communication: None at bedside Disposition Plan:  Status is: Inpatient Remains inpatient appropriate because: Need for ongoing breathing treatments and IV steroids.     Consultants:  Cardiology   Procedures:  See below   Antimicrobials:  Anti-infectives (From admission, onward)    Start     Dose/Rate Route Frequency Ordered Stop   06/19/21 1000  azithromycin (ZITHROMAX) tablet 250 mg       See Hyperspace for full Linked Orders Report.   250 mg Oral Daily 06/18/21 2106 06/22/21 0917   06/18/21 2115  azithromycin (ZITHROMAX) tablet 500 mg       See Hyperspace for full Linked Orders Report.   500 mg Oral Daily 06/18/21 2106 06/18/21 2217       Subjective: Patient seen and evaluated today with ongoing congestion and shortness of breath along with cough.  He denies any chest pain or palpitations.  Noted to have new onset atrial fibrillation with elevated heart rates after breathing treatment last night and transferred to stepdown unit.  Objective: Vitals:   06/22/21 0736 06/22/21 0800 06/22/21 0906 06/22/21 1000  BP:  (!) 103/58 107/65 127/65  Pulse:  (!) 124 (!) 119 (!) 133  Resp:  20 20 (!) 22  Temp:  98 F (36.7 C)    TempSrc:  Oral    SpO2: 92% 99% 99% 93%  Weight:      Height:        Intake/Output Summary (Last 24 hours) at 06/22/2021 1035 Last data filed at 06/22/2021 1000 Gross per 24 hour  Intake 453.43 ml  Output --  Net 453.43 ml   Filed Weights   06/21/21 0400 06/22/21 0049 06/22/21 0538  Weight: 100.6 kg 96.3 kg 97.6  kg    Examination:  General exam: Appears calm and comfortable  Respiratory system: Diminished to auscultation bilaterally. Respiratory effort normal.  On supplemental O2 Cardiovascular system: S1 & S2 heard, irregular and tachycardic Gastrointestinal system: Abdomen is soft Central nervous system: Alert and awake Extremities: No edema Skin: No  significant lesions noted Psychiatry: Flat affect.    Data Reviewed: I have personally reviewed following labs and imaging studies  CBC: Recent Labs  Lab 06/18/21 1803 06/19/21 0129 06/20/21 0348 06/21/21 0354 06/22/21 0415  WBC 14.3* 15.7* 23.9* 18.9* 15.7*  NEUTROABS 12.1*  --   --   --   --   HGB 13.8 13.0 12.7* 11.8* 12.7*  HCT 45.0 43.6 40.7 39.9 41.6  MCV 93.4 94.8 93.1 94.1 93.9  PLT 309 276 286 252 262   Basic Metabolic Panel: Recent Labs  Lab 06/18/21 1803 06/19/21 0129 06/20/21 0348 06/21/21 0354 06/22/21 0415  NA 139 140 140 141 139  K 4.4 4.4 4.1 4.1 3.8  CL 100 101 100 101 99  CO2 31 31 32 34* 32  GLUCOSE 162* 168* 160* 150* 170*  BUN 13 15 20  25* 28*  CREATININE 0.80 0.85 0.87 0.91 0.96  CALCIUM 8.9 8.9 9.1 8.8* 8.7*  MG  --  2.2 2.2 2.4 2.5*  PHOS  --  4.0  --   --   --    GFR: Estimated Creatinine Clearance: 82.1 mL/min (by C-G formula based on SCr of 0.96 mg/dL). Liver Function Tests: Recent Labs  Lab 06/18/21 1803 06/19/21 0129  AST 25 23  ALT 22 22  ALKPHOS 63 59  BILITOT 0.7 0.5  PROT 6.8 6.5  ALBUMIN 3.7 3.4*   No results for input(s): LIPASE, AMYLASE in the last 168 hours. No results for input(s): AMMONIA in the last 168 hours. Coagulation Profile: No results for input(s): INR, PROTIME in the last 168 hours. Cardiac Enzymes: No results for input(s): CKTOTAL, CKMB, CKMBINDEX, TROPONINI in the last 168 hours. BNP (last 3 results) No results for input(s): PROBNP in the last 8760 hours. HbA1C: No results for input(s): HGBA1C in the last 72 hours. CBG: No results for input(s): GLUCAP in the last 168 hours. Lipid Profile: No results for input(s): CHOL, HDL, LDLCALC, TRIG, CHOLHDL, LDLDIRECT in the last 72 hours. Thyroid Function Tests: No results for input(s): TSH, T4TOTAL, FREET4, T3FREE, THYROIDAB in the last 72 hours. Anemia Panel: No results for input(s): VITAMINB12, FOLATE, FERRITIN, TIBC, IRON, RETICCTPCT in the last 72  hours. Sepsis Labs: No results for input(s): PROCALCITON, LATICACIDVEN in the last 168 hours.  Recent Results (from the past 240 hour(s))  MRSA Next Gen by PCR, Nasal     Status: None   Collection Time: 06/22/21  1:02 AM   Specimen: Nasal Mucosa; Nasal Swab  Result Value Ref Range Status   MRSA by PCR Next Gen NOT DETECTED NOT DETECTED Final    Comment: (NOTE) The GeneXpert MRSA Assay (FDA approved for NASAL specimens only), is one component of a comprehensive MRSA colonization surveillance program. It is not intended to diagnose MRSA infection nor to guide or monitor treatment for MRSA infections. Test performance is not FDA approved in patients less than 2 years old. Performed at [facility], [address], [place], [place] [address]          Radiology Studies: No results found.      Scheduled Meds:  acetylcysteine  2 mL Nebulization TID   adenosine  6 mg Intravenous Once   apixaban  5 mg Oral BID   budesonide (PULMICORT) nebulizer solution  0.25 mg Nebulization BID   Chlorhexidine Gluconate Cloth  6 each Topical Daily   dextromethorphan-guaiFENesin  1 tablet Oral BID   diltiazem  180 mg Oral Daily   ipratropium  0.5 mg Nebulization TID   levalbuterol  1.25 mg Nebulization TID   mouth rinse  15 mL Mouth Rinse BID   mouth rinse  15 mL Mouth Rinse BID   methylPREDNISolone (SOLU-MEDROL) injection  80 mg Intravenous Q12H   pantoprazole  40 mg Oral Daily   tamsulosin  0.4 mg Oral QPC supper   Continuous Infusions:  amiodarone 30 mg/hr (06/22/21 0644)     LOS: 3 days    Time spent: 35 minutes    Chelcea Zahn Darleen Crocker, DO [facility]  If 7PM-7AM, please contact night-coverage [email or web address] 06/22/2021, 10:35 AM

## 2021-06-22 NOTE — Plan of Care (Signed)
  Problem: Activity: Goal: Ability to tolerate increased activity will improve Outcome: Progressing   Problem: Respiratory: Goal: Ability to maintain a clear airway will improve Outcome: Progressing Goal: Levels of oxygenation will improve Outcome: Progressing Goal: Ability to maintain adequate ventilation will improve Outcome: Progressing   Problem: Education: Goal: Knowledge of General Education information will improve Description: Including pain rating scale, medication(s)/side effects and non-pharmacologic comfort measures Outcome: Progressing   Problem: Health Behavior/Discharge Planning: Goal: Ability to manage health-related needs will improve Outcome: Progressing   Problem: Clinical Measurements: Goal: Ability to maintain clinical measurements within normal limits will improve Outcome: Progressing Goal: Will remain free from infection Outcome: Progressing Goal: Diagnostic test results will improve Outcome: Progressing Goal: Respiratory complications will improve Outcome: Progressing Goal: Cardiovascular complication will be avoided Outcome: Progressing   Problem: Activity: Goal: Risk for activity intolerance will decrease Outcome: Progressing   Problem: Nutrition: Goal: Adequate nutrition will be maintained Outcome: Progressing   Problem: Coping: Goal: Level of anxiety will decrease Outcome: Progressing   Problem: Elimination: Goal: Will not experience complications related to bowel motility Outcome: Progressing Goal: Will not experience complications related to urinary retention Outcome: Progressing   Problem: Pain Managment: Goal: General experience of comfort will improve Outcome: Progressing   Problem: Safety: Goal: Ability to remain free from injury will improve Outcome: Progressing   Problem: Skin Integrity: Goal: Risk for impaired skin integrity will decrease Outcome: Progressing

## 2021-06-22 NOTE — Plan of Care (Signed)
  Problem: Activity: Goal: Ability to tolerate increased activity will improve Outcome: Progressing   Problem: Respiratory: Goal: Ability to maintain a clear airway will improve Outcome: Progressing Goal: Levels of oxygenation will improve Outcome: Progressing Goal: Ability to maintain adequate ventilation will improve Outcome: Progressing   Problem: Education: Goal: Knowledge of General Education information will improve Description: Including pain rating scale, medication(s)/side effects and non-pharmacologic comfort measures Outcome: Progressing   Problem: Health Behavior/Discharge Planning: Goal: Ability to manage health-related needs will improve Outcome: Progressing   Problem: Clinical Measurements: Goal: Ability to maintain clinical measurements within normal limits will improve Outcome: Progressing Goal: Will remain free from infection Outcome: Progressing Goal: Diagnostic test results will improve Outcome: Progressing Goal: Respiratory complications will improve Outcome: Progressing Goal: Cardiovascular complication will be avoided Outcome: Progressing   Problem: Activity: Goal: Risk for activity intolerance will decrease Outcome: Progressing   Problem: Nutrition: Goal: Adequate nutrition will be maintained Outcome: Progressing   Problem: Coping: Goal: Level of anxiety will decrease Outcome: Progressing   Problem: Elimination: Goal: Will not experience complications related to bowel motility Outcome: Progressing Goal: Will not experience complications related to urinary retention Outcome: Progressing   Problem: Pain Managment: Goal: General experience of comfort will improve Outcome: Progressing   Problem: Safety: Goal: Ability to remain free from injury will improve Outcome: Progressing   Problem: Skin Integrity: Goal: Risk for impaired skin integrity will decrease Outcome: Progressing   

## 2021-06-22 NOTE — Progress Notes (Signed)
Pt converted from afib RVR to SVT with heart rate 150-160s; BP WNLs; pt had increase work of breathing during this time; adenosine was ordered to give, but pt converted out of SVT to NSR with no intervention; MD made aware; pt states his breathing feels much better; pt remains on cardiac monitoring.

## 2021-06-22 NOTE — Consult Note (Signed)
Cardiology Consultation:   Patient ID: Jacob Pace; [id number]; [birth year]   Admit date: 06/18/2021 Date of Consult: 06/22/2021  Primary Care Provider: [facility] [place] Primary Cardiologist:  New to [facility] Primary Electrophysiologist: None   Patient Profile:   Jacob Pace is a 71 y.o. male with a history of hypertension, mixed hyperlipidemia, GERD, and COPD who is being seen today for the evaluation of newly documented atrial fibrillation at the request of Dr. Manuella Ghazi.  History of Present Illness:   Jacob Pace is currently hospitalized with worsening shortness of breath in the setting of known chronic hypoxic respiratory failure with COPD at baseline.  He initially required BiPAP for support and is being treated for COPD exacerbation by primary team.  He was noted to be in SVT on initial evaluation which responded to adenosine.  He is not aware of any prior history of cardiac arrhythmias.  Within the last 24 hours he went into rapid atrial fibrillation and has been placed on IV amiodarone by primary team.  We are asked to assist with his management.  He does state that he has felt a sense of palpitations with the recent atrial fibrillation.  Also mentions that he has felt palpitations intermittently in the past but has no prior diagnosis of atrial fibrillation.  CHA2DS2-VASc score is at least 3.  He gets routine care through the [facility] system.  Past Medical History:  Diagnosis Date   COPD (chronic obstructive pulmonary disease) ([place])    Essential hypertension    GERD (gastroesophageal reflux disease)    Mixed hyperlipidemia     Past Surgical History:  Procedure Laterality Date   HERNIA REPAIR Right 2007     Inpatient Medications: Scheduled Meds:  acetylcysteine  2 mL Nebulization TID   adenosine  6 mg Intravenous Once   azithromycin  250 mg Oral Daily   budesonide (PULMICORT) nebulizer solution  0.25 mg Nebulization BID   Chlorhexidine Gluconate Cloth  6  each Topical Daily   dextromethorphan-guaiFENesin  1 tablet Oral BID   diltiazem  180 mg Oral Daily   enoxaparin (LOVENOX) injection  100 mg Subcutaneous BID   ipratropium  0.5 mg Nebulization TID   levalbuterol  1.25 mg Nebulization TID   mouth rinse  15 mL Mouth Rinse BID   mouth rinse  15 mL Mouth Rinse BID   methylPREDNISolone (SOLU-MEDROL) injection  125 mg Intravenous Once   methylPREDNISolone (SOLU-MEDROL) injection  80 mg Intravenous Q12H   pantoprazole  40 mg Oral Daily   tamsulosin  0.4 mg Oral QPC supper   Continuous Infusions:  amiodarone 30 mg/hr (06/22/21 0644)   PRN Meds: acetaminophen, levalbuterol  Allergies:   No Active Allergies  Social History:   Social History   Tobacco Use   Smoking status: Former    Packs/day: 1.00    Years: 40.00    Pack years: 40.00    Types: Cigarettes    Quit date: 02/01/2008    Years since quitting: 13.3   Smokeless tobacco: Never  Substance Use Topics   Alcohol use: Yes    Comment: weekends     Family History:   The patient's family history includes Breast cancer in his sister; Cancer in his father.  ROS:  Please see the history of present illness.  All other ROS reviewed and negative.     Physical Exam/Data:   Vitals:   06/22/21 0600 06/22/21 0732 06/22/21 0736 06/22/21 0906  BP: (!) 131/100   107/65  Pulse: Marland Kitchen)  35     Resp: 20     Temp:      TempSrc:      SpO2: 97% 92% 92%   Weight:      Height:        Intake/Output Summary (Last 24 hours) at 06/22/2021 0908 Last data filed at 06/22/2021 0500 Gross per 24 hour  Intake 136.69 ml  Output --  Net 136.69 ml   Filed Weights   06/21/21 0400 06/22/21 0049 06/22/21 0538  Weight: 100.6 kg 96.3 kg 97.6 kg   Body mass index is 27.63 kg/m.   Gen: Patient appears comfortable at rest. HEENT: Conjunctiva and lids normal, oropharynx clear. Neck: Supple, no elevated JVP or carotid bruits, no thyromegaly. Lungs: Diminished breath sounds. Cardiac: Irregularly  irregular, no S3 or significant systolic murmur, no pericardial rub. Abdomen: Soft, nontender, bowel sounds present. Extremities: Mild ankle edema, distal pulses 2+. Skin: Warm and dry. Musculoskeletal: No kyphosis. Neuropsychiatric: Alert and oriented x3, affect grossly appropriate.  EKG:  An ECG dated 06/21/2021 was personally reviewed today and demonstrated:  Supraventricular tachycardia.  Telemetry:  I personally reviewed telemetry which shows atrial fibrillation with RVR.  Relevant CV Studies:  No cardiac testing available for review.  Laboratory Data:  Chemistry Recent Labs  Lab 06/20/21 0348 06/21/21 0354 06/22/21 0415  NA 140 141 139  K 4.1 4.1 3.8  CL 100 101 99  CO2 32 34* 32  GLUCOSE 160* 150* 170*  BUN 20 25* 28*  CREATININE 0.87 0.91 0.96  CALCIUM 9.1 8.8* 8.7*  GFRNONAA >60 >60 >60  ANIONGAP 8 6 8     Recent Labs  Lab 06/18/21 1803 06/19/21 0129  PROT 6.8 6.5  ALBUMIN 3.7 3.4*  AST 25 23  ALT 22 22  ALKPHOS 63 59  BILITOT 0.7 0.5   Hematology Recent Labs  Lab 06/20/21 0348 06/21/21 0354 06/22/21 0415  WBC 23.9* 18.9* 15.7*  RBC 4.37 4.24 4.43  HGB 12.7* 11.8* 12.7*  HCT 40.7 39.9 41.6  MCV 93.1 94.1 93.9  MCH 29.1 27.8 28.7  MCHC 31.2 29.6* 30.5  RDW 14.6 14.2 14.2  PLT 286 252 262   Cardiac EnzymesNo results for input(s): TROPONINIHS in the last 720 hours. BNP Recent Labs  Lab 06/18/21 1803  BNP 44.0     Radiology/Studies:  DG Chest Port 1 View  Result Date: 06/18/2021 CLINICAL DATA:  Shortness of breath EXAM: PORTABLE CHEST 1 VIEW COMPARISON:  05/02/2021 FINDINGS: Stable heart size. Aortic atherosclerosis. Hyperinflated lungs with emphysematous changes in the upper lung fields. Chronically coarsened bibasilar interstitial markings. No superimposed airspace opacities identified. No pleural effusion or pneumothorax. IMPRESSION: COPD without evidence of superimposed acute cardiopulmonary process. Electronically Signed   By: Davina Poke D.O.   On: 06/18/2021 18:30    Assessment and Plan:   1.  Newly documented atrial fibrillation with RVR, CHA2DS2-VASc score is at least 3.  Patient reports prior sense of palpitations but no previously documented arrhythmia.  Placed on IV amiodarone temporarily.  He is also on Cardizem CD 180 mg daily.  2.  SVT at presentation, possibly reentrant mechanism as this resolved with adenosine.  3.  COPD with acute on chronic hypoxic respiratory failure, being managed for active exacerbation.  No longer on BiPAP.  Currently on azithromycin, steroids, and nebulizer treatments.  4.  Essential hypertension.  Recent systolics in the 0000000 to [id number] range.  At this point would continue IV amiodarone and Cardizem CD (was already on that as an outpatient  for treatment of hypertension).  Hopefully he will convert without further specific intervention as his pulmonary status improves.  Would go ahead and transition from aspirin to Eliquis for stroke prophylaxis, particularly if his atrial fibrillation persists and he requires an electrical cardioversion sooner rather than later.  One wonders if his intermittent palpitations over time have actually been atrial fibrillation that was undiagnosed.  He is a candidate for long-term anticoagulation based on thromboembolic risk score.  Will eventually get an echocardiogram once heart rate comes down.  We will continue to follow.  Signed, Rozann Lesches, MD  06/22/2021 9:08 AM

## 2021-06-23 ENCOUNTER — Other Ambulatory Visit (HOSPITAL_COMMUNITY): Payer: Self-pay | Admitting: *Deleted

## 2021-06-23 ENCOUNTER — Inpatient Hospital Stay (HOSPITAL_COMMUNITY): Payer: No Typology Code available for payment source

## 2021-06-23 DIAGNOSIS — I4891 Unspecified atrial fibrillation: Secondary | ICD-10-CM | POA: Diagnosis not present

## 2021-06-23 DIAGNOSIS — J441 Chronic obstructive pulmonary disease with (acute) exacerbation: Secondary | ICD-10-CM | POA: Diagnosis not present

## 2021-06-23 LAB — BASIC METABOLIC PANEL
Anion gap: 7 (ref 5–15)
BUN: 23 mg/dL (ref 8–23)
CO2: 35 mmol/L — ABNORMAL HIGH (ref 22–32)
Calcium: 8.5 mg/dL — ABNORMAL LOW (ref 8.9–10.3)
Chloride: 97 mmol/L — ABNORMAL LOW (ref 98–111)
Creatinine, Ser: 0.96 mg/dL (ref 0.61–1.24)
GFR, Estimated: >60 mL/min (ref >60)
Glucose, Bld: 166 mg/dL — ABNORMAL HIGH (ref 70–99)
Potassium: 4.1 mmol/L (ref 3.5–5.1)
Sodium: 139 mmol/L (ref 135–145)

## 2021-06-23 LAB — CBC
HCT: 41 % (ref 39.0–52.0)
Hemoglobin: 12.4 g/dL — ABNORMAL LOW (ref 13.0–17.0)
MCH: 28.3 pg (ref 26.0–34.0)
MCHC: 30.2 g/dL (ref 30.0–36.0)
MCV: 93.6 fL (ref 80.0–100.0)
Platelets: 241 10*3/uL (ref 150–400)
RBC: 4.38 MIL/uL (ref 4.22–5.81)
RDW: 13.9 % (ref 11.5–15.5)
WBC: 13 10*3/uL — ABNORMAL HIGH (ref 4.0–10.5)
nRBC: 0 % (ref 0.0–0.2)

## 2021-06-23 LAB — ECHOCARDIOGRAM COMPLETE
AR max vel: 2.37 cm2
AV Area VTI: 2.58 cm2
AV Area mean vel: 2.26 cm2
AV Mean grad: 3 mmHg
AV Peak grad: 6.7 mmHg
Ao pk vel: 1.29 m/s
Area-P 1/2: 3.42 cm2
Calc EF: 53.3 %
Height: 74 in
MV VTI: 3.68 cm2
S' Lateral: 2.9 cm
Single Plane A2C EF: 44.5 %
Single Plane A4C EF: 61.1 %
Weight: 3442.7 oz

## 2021-06-23 LAB — GLUCOSE, CAPILLARY: Glucose-Capillary: 144 mg/dL — ABNORMAL HIGH (ref 70–99)

## 2021-06-23 LAB — MAGNESIUM: Magnesium: 2.4 mg/dL (ref 1.7–2.4)

## 2021-06-23 MED ORDER — ARFORMOTEROL TARTRATE 15 MCG/2ML IN NEBU
15.0000 ug | INHALATION_SOLUTION | Freq: Two times a day (BID) | RESPIRATORY_TRACT | Status: DC
  Administered 2021-06-24 – 2021-06-28 (×5): 15 ug via RESPIRATORY_TRACT
  Filled 2021-06-23 (×6): qty 2

## 2021-06-23 MED ORDER — DILTIAZEM HCL ER COATED BEADS 240 MG PO CP24
240.0000 mg | ORAL_CAPSULE | Freq: Every day | ORAL | Status: DC
  Administered 2021-06-23 – 2021-06-28 (×6): 240 mg via ORAL
  Filled 2021-06-23 (×6): qty 1

## 2021-06-23 MED ORDER — LEVALBUTEROL HCL 0.63 MG/3ML IN NEBU: 0.6300 mg | INHALATION_SOLUTION | Freq: Four times a day (QID) | RESPIRATORY_TRACT | Status: DC | PRN

## 2021-06-23 MED ORDER — PERFLUTREN LIPID MICROSPHERE
1.0000 mL | INTRAVENOUS | Status: AC | PRN
  Administered 2021-06-23: 6 mL via INTRAVENOUS

## 2021-06-23 MED ORDER — BUDESONIDE 0.5 MG/2ML IN SUSP
0.5000 mg | Freq: Two times a day (BID) | RESPIRATORY_TRACT | Status: DC
  Administered 2021-06-24 – 2021-06-28 (×5): 0.5 mg via RESPIRATORY_TRACT
  Filled 2021-06-23 (×6): qty 2

## 2021-06-23 NOTE — Progress Notes (Signed)
PROGRESS NOTE    Jacob Pace  [id number] DOB: 16-Feb-1950 DOA: 06/18/2021 [facility]CP: [facility]   Brief Narrative:    Jacob Pace is a 70 y.o. male with medical history significant of essential hypertension, chronic respiratory failure on supplemental oxygen at 4 LPM and COPD who presents to the emergency department via EMS due to worsening shortness of breath which started last night.  He was admitted for acute on chronic hypoxemic respiratory failure secondary to COPD exacerbation.  He was showing signs of improvement, but overnight on 5/22 he went into new onset atrial fibrillation with RVR and was started on amiodarone infusion.  We will transfer to telemetry bed.  A-fib now controlled with adjusted dose of Cardizem and not requiring amiodarone.  Continue treatment for COPD exacerbation.  Assessment & Plan:   Principal Problem:   COPD exacerbation ([place]) Active Problems:   Acute on chronic respiratory failure with hypoxia (HCC)   Essential (primary) hypertension   Leukocytosis   Hyperglycemia   COPD with acute exacerbation ([facility])  Assessment and Plan:  Acute on chronic respiratory failure with hypoxia secondary to COPD exacerbation -Continue Xopenex, Mucinex and the use of flutter valve.  -Continue steroids, Pulmicort and initiate the use of Brovana. -Follow breathing status and bronchodilation response.  -Continue Protonix to prevent steroid-induced ulcer -Patient chronically uses 4 L nasal cannula supplementation at baseline; currently requiring 6 L.  We will continue to wean down to home dose as tolerated.   -Continue the use of mucolytic's and chest physiotherapy.   Supraventricular tachycardia- resolved -Adenosine used on admission -Continue telemetry monitoring -Patient with new onset A-fib with RVR as described below -Currently behaving as paroxysmal A-fib.  New onset atrial fibrillation with RVR -Continue the use of Eliquis for  anticoagulation -Continue adjusted dose of Cardizem as per cardiology recommendation -Amiodarone has been now discontinued. -2D echo demonstrating no wall motion abnormalities and preserved ejection fraction.   -TSH within normal limits.   Leukocytosis possibly reactive -Currently receiving steroids -Patient is afebrile -Continue to follow trend. -Holding on antibiotics at this point.    Essential hypertension (controlled) -Continue adjusted dose of diltiazem as per cardiology recommendation -Follow-up vital signs.   -Blood pressure overall stable.     DVT prophylaxis: Eliquis Code Status: DNI Family Communication: None at bedside Disposition Plan:  Status is: Inpatient  Remains inpatient appropriate because: Need for ongoing breathing treatments and IV steroids.     Consultants:  Cardiology   Procedures:  See below   Antimicrobials:  Anti-infectives (From admission, onward)    Start     Dose/Rate Route Frequency Ordered Stop   06/19/21 1000  azithromycin (ZITHROMAX) tablet 250 mg       See Hyperspace for full Linked Orders Report.   250 mg Oral Daily 06/18/21 2106 06/22/21 0917   06/18/21 2115  azithromycin (ZITHROMAX) tablet 500 mg       See Hyperspace for full Linked Orders Report.   500 mg Oral Daily 06/18/21 2106 06/18/21 2217       Subjective: Still short winded with activity and demonstrating bilateral expiratory wheezing with fair air movement.  Reports no chest pain, no palpitations and overall feeling better.  Patient is afebrile.  Objective: Vitals:   06/23/21 0755 06/23/21 0800 06/23/21 1137 06/23/21 1611  BP:  (!) 156/86    Pulse: 90 92 92   Resp: (!) 23 (!) 24 (!) 22   Temp:   97.6 F (36.4 C) 97.7 F (36.5 C)  TempSrc:   Oral Oral  SpO2: 99% 100% 97%   Weight:      Height:        Intake/Output Summary (Last 24 hours) at 06/23/2021 1917 Last data filed at 06/23/2021 0800 Gross per 24 hour  Intake 199.43 ml  Output 150 ml  Net 49.43 ml    Filed Weights   06/22/21 0049 06/22/21 0538 06/23/21 0400  Weight: 96.3 kg 97.6 kg 97.6 kg    Examination: General exam: Alert, awake, oriented x 3; no chest pain, no nausea or vomiting.  Using 6 L nasal cannula supplementation and expressing improvement in his breathing.  Denies palpitation currently. Respiratory system: Fair air movement bilaterally; positive rhonchi, no using accessory muscle.  Bilateral expiratory wheezing appreciated. Cardiovascular system: Rate controlled, no rubs, no gallops, no JVD. Gastrointestinal system: Abdomen is nondistended, soft and nontender. No organomegaly or masses felt. Normal bowel sounds heard. Central nervous system: Alert and oriented. No focal neurological deficits. Extremities: No cyanosis or clubbing.  No edema. Skin: No petechiae. Psychiatry: Judgement and insight appear normal.  Flat affect appreciated.    Data Reviewed: I have personally reviewed following labs and imaging studies  CBC: Recent Labs  Lab 06/18/21 1803 06/19/21 0129 06/20/21 0348 06/21/21 0354 06/22/21 0415 06/23/21 0358  WBC 14.3* 15.7* 23.9* 18.9* 15.7* 13.0*  NEUTROABS 12.1*  --   --   --   --   --   HGB 13.8 13.0 12.7* 11.8* 12.7* 12.4*  HCT 45.0 43.6 40.7 39.9 41.6 41.0  MCV 93.4 94.8 93.1 94.1 93.9 93.6  PLT 309 276 286 252 262 241   Basic Metabolic Panel: Recent Labs  Lab 06/19/21 0129 06/20/21 0348 06/21/21 0354 06/22/21 0415 06/23/21 0358  NA 140 140 141 139 139  K 4.4 4.1 4.1 3.8 4.1  CL 101 100 101 99 97*  CO2 31 32 34* 32 35*  GLUCOSE 168* 160* 150* 170* 166*  BUN 15 20 25* 28* 23  CREATININE 0.85 0.87 0.91 0.96 0.96  CALCIUM 8.9 9.1 8.8* 8.7* 8.5*  MG 2.2 2.2 2.4 2.5* 2.4  PHOS 4.0  --   --   --   --    GFR: Estimated Creatinine Clearance: 82.1 mL/min (by C-G formula based on SCr of 0.96 mg/dL).  Liver Function Tests: Recent Labs  Lab 06/18/21 1803 06/19/21 0129  AST 25 23  ALT 22 22  ALKPHOS 63 59  BILITOT 0.7 0.5  PROT  6.8 6.5  ALBUMIN 3.7 3.4*   CBG: Recent Labs  Lab 06/23/21 1605  GLUCAP 144*   Thyroid Function Tests: Recent Labs    06/22/21 0415  TSH 0.400    Recent Results (from the past 240 hour(s))  MRSA Next Gen by PCR, Nasal     Status: None   Collection Time: 06/22/21  1:02 AM   Specimen: Nasal Mucosa; Nasal Swab  Result Value Ref Range Status   MRSA by PCR Next Gen NOT DETECTED NOT DETECTED Final    Comment: (NOTE) The GeneXpert MRSA Assay (FDA approved for NASAL specimens only), is one component of a comprehensive MRSA colonization surveillance program. It is not intended to diagnose MRSA infection nor to guide or monitor treatment for MRSA infections. Test performance is not FDA approved in patients less than 74 years old. Performed at [facility], [address] [place], [place] [address]      Radiology Studies: ECHOCARDIOGRAM COMPLETE  Result Date: 06/23/2021    ECHOCARDIOGRAM REPORT   Patient Name:  Jacob Pace Date of Exam: 06/23/2021 Medical Rec #:  [id number]       Height:       74.0 in Accession #:    [id number]      Weight:       215.2 lb Date of Birth:  04-22-1950       BSA:          2.242 m Patient Age:    66 years        BP:           156/86 mmHg Patient Gender: M               HR:           93 bpm. Exam Location:  [facility] Procedure: 2D Echo, Cardiac Doppler, Color Doppler and Intracardiac            Opacification Agent Indications:    Atrial Fibrillation  History:        Patient has prior history of Echocardiogram examinations, most                 recent 02/06/2013. COPD, Arrythmias:Tachycardia; Risk                 Factors:Hypertension and Former Smoker.  Sonographer:    Wenda Low Referring Phys: [place] Comments: Image acquisition challenging due to COPD. IMPRESSIONS  1. Left ventricular ejection fraction, by estimation, is 55 to 60%. The left ventricle has normal function. The left ventricle has no regional wall motion  abnormalities. There is mild left ventricular hypertrophy. Left ventricular diastolic parameters are consistent with Grade I diastolic dysfunction (impaired relaxation).  2. Right ventricular systolic function is low normal. The right ventricular size is normal. Mildly increased right ventricular wall thickness. Tricuspid regurgitation signal is inadequate for assessing PA pressure.  3. The mitral valve is grossly normal. No evidence of mitral valve regurgitation.  4. The aortic valve is tricuspid. There is mild calcification of the aortic valve. Aortic valve regurgitation is not visualized. Aortic valve sclerosis is present, with no evidence of aortic valve stenosis. Aortic valve mean gradient measures 3.0 mmHg.  5. The inferior vena cava is normal in size with greater than 50% respiratory variability, suggesting right atrial pressure of 3 mmHg. Comparison(s): Prior images unable to be directly viewed. FINDINGS  Left Ventricle: Left ventricular ejection fraction, by estimation, is 55 to 60%. The left ventricle has normal function. The left ventricle has no regional wall motion abnormalities. Definity contrast agent was given IV to delineate the left ventricular  endocardial borders. The left ventricular internal cavity size was normal in size. There is mild left ventricular hypertrophy. Left ventricular diastolic parameters are consistent with Grade I diastolic dysfunction (impaired relaxation). Right Ventricle: The right ventricular size is normal. Mildly increased right ventricular wall thickness. Right ventricular systolic function is low normal. Tricuspid regurgitation signal is inadequate for assessing PA pressure. Left Atrium: Left atrial size was normal in size. Right Atrium: Right atrial size was normal in size. Pericardium: There is no evidence of pericardial effusion. Presence of epicardial fat layer. Mitral Valve: The mitral valve is grossly normal. No evidence of mitral valve regurgitation. MV peak  gradient, 2.8 mmHg. The mean mitral valve gradient is 1.0 mmHg. Tricuspid Valve: The tricuspid valve is grossly normal. Tricuspid valve regurgitation is trivial. Aortic Valve: The aortic valve is tricuspid. There is mild calcification of the aortic valve. There is mild aortic valve annular calcification. Aortic  valve regurgitation is not visualized. Aortic valve sclerosis is present, with no evidence of aortic valve stenosis. Aortic valve mean gradient measures 3.0 mmHg. Aortic valve peak gradient measures 6.7 mmHg. Aortic valve area, by VTI measures 2.58 cm. Pulmonic Valve: The pulmonic valve was not well visualized. Pulmonic valve regurgitation is not visualized. Aorta: The aortic root is normal in size and structure. Venous: The inferior vena cava is normal in size with greater than 50% respiratory variability, suggesting right atrial pressure of 3 mmHg. IAS/Shunts: No atrial level shunt detected by color flow Doppler.  LEFT VENTRICLE PLAX 2D LVIDd:         4.20 cm     Diastology LVIDs:         2.90 cm     LV e' medial:    7.07 cm/s LV PW:         1.20 cm     LV E/e' medial:  8.1 LV IVS:        1.30 cm     LV e' lateral:   10.20 cm/s LVOT diam:     2.00 cm     LV E/e' lateral: 5.6 LV SV:         58 LV SV Index:   26 LVOT Area:     3.14 cm  LV Volumes (MOD) LV vol d, MOD A2C: 53.3 ml LV vol d, MOD A4C: 51.2 ml LV vol s, MOD A2C: 29.6 ml LV vol s, MOD A4C: 19.9 ml LV SV MOD A2C:     23.7 ml LV SV MOD A4C:     51.2 ml LV SV MOD BP:      28.8 ml RIGHT VENTRICLE RV Basal diam:  4.20 cm RV Mid diam:    3.80 cm RV S prime:     14.90 cm/s TAPSE (M-mode): 2.5 cm LEFT ATRIUM             Index        RIGHT ATRIUM           Index LA diam:        3.70 cm 1.65 cm/m   RA Area:     18.80 cm LA Vol (A2C):   50.1 ml 22.35 ml/m  RA Volume:   56.60 ml  25.24 ml/m LA Vol (A4C):   48.8 ml 21.77 ml/m LA Biplane Vol: 50.1 ml 22.35 ml/m  AORTIC VALVE                    PULMONIC VALVE AV Area (Vmax):    2.37 cm     PV Vmax:        0.76 m/s AV Area (Vmean):   2.26 cm     PV Peak grad:  2.3 mmHg AV Area (VTI):     2.58 cm AV Vmax:           129.00 cm/s AV Vmean:          83.500 cm/s AV VTI:            0.226 m AV Peak Grad:      6.7 mmHg AV Mean Grad:      3.0 mmHg LVOT Vmax:         97.30 cm/s LVOT Vmean:        60.100 cm/s LVOT VTI:          0.186 m LVOT/AV VTI ratio: 0.82  AORTA Ao Root diam: 3.90 cm Ao Asc diam:  3.50 cm MITRAL VALVE MV  Area (PHT): 3.42 cm    SHUNTS MV Area VTI:   3.68 cm    Systemic VTI:  0.19 m MV Peak grad:  2.8 mmHg    Systemic Diam: 2.00 cm MV Mean grad:  1.0 mmHg MV Vmax:       0.84 m/s MV Vmean:      51.5 cm/s MV Decel Time: 222 msec MV E velocity: 57.40 cm/s MV A velocity: 87.80 cm/s MV E/A ratio:  0.65 Rozann Lesches MD Electronically signed by Rozann Lesches MD Signature Date/Time: 06/23/2021/12:51:50 PM    Final      Scheduled Meds:  adenosine  6 mg Intravenous Once   adenosine (ADENOCARD) IV  6 mg Intravenous Once   apixaban  5 mg Oral BID   budesonide (PULMICORT) nebulizer solution  0.25 mg Nebulization BID   Chlorhexidine Gluconate Cloth  6 each Topical Daily   dextromethorphan-guaiFENesin  1 tablet Oral BID   diltiazem  240 mg Oral Daily   ipratropium  0.5 mg Nebulization TID   levalbuterol  1.25 mg Nebulization TID   mouth rinse  15 mL Mouth Rinse BID   mouth rinse  15 mL Mouth Rinse BID   methylPREDNISolone (SOLU-MEDROL) injection  80 mg Intravenous Q12H   pantoprazole  40 mg Oral Daily   tamsulosin  0.4 mg Oral QPC supper   Continuous Infusions:   LOS: 4 days    Barton Dubois, MD [facility]  If 7PM-7AM, please contact night-coverage [email or web address] 06/23/2021, 7:17 PM

## 2021-06-23 NOTE — Progress Notes (Signed)
*  PRELIMINARY RESULTS* Echocardiogram 2D Echocardiogram has been performed.  Carolyne Fiscal 06/23/2021, 11:50 AM

## 2021-06-23 NOTE — Progress Notes (Signed)
Progress Note  Patient Name: Jacob Pace Date of Encounter: 06/23/2021  Primary Cardiologist:  New to [facility] medical system)  Subjective   Patient feeling better this morning.  Short of breath.  No cough or wheezing.  No palpitations or chest pain.  Inpatient Medications    Scheduled Meds:  adenosine  6 mg Intravenous Once   adenosine (ADENOCARD) IV  6 mg Intravenous Once   apixaban  5 mg Oral BID   budesonide (PULMICORT) nebulizer solution  0.25 mg Nebulization BID   Chlorhexidine Gluconate Cloth  6 each Topical Daily   dextromethorphan-guaiFENesin  1 tablet Oral BID   diltiazem  180 mg Oral Daily   ipratropium  0.5 mg Nebulization TID   levalbuterol  1.25 mg Nebulization TID   mouth rinse  15 mL Mouth Rinse BID   mouth rinse  15 mL Mouth Rinse BID   methylPREDNISolone (SOLU-MEDROL) injection  80 mg Intravenous Q12H   pantoprazole  40 mg Oral Daily   tamsulosin  0.4 mg Oral QPC supper   Continuous Infusions:  amiodarone 30 mg/hr (06/23/21 0419)   PRN Meds: acetaminophen, levalbuterol   Vital Signs    Vitals:   06/23/21 0400 06/23/21 0500 06/23/21 0600 06/23/21 0718  BP: 129/81 111/72 (!) 142/110   Pulse: 78 76 92 85  Resp: 20 19 (!) 27 (!) 27  Temp: (!) 97.5 F (36.4 C)   97.8 F (36.6 C)  TempSrc: Oral   Oral  SpO2: 95% 91% 99% 100%  Weight: 97.6 kg     Height:        Intake/Output Summary (Last 24 hours) at 06/23/2021 0842 Last data filed at 06/23/2021 0600 Gross per 24 hour  Intake 1085.69 ml  Output 150 ml  Net 935.69 ml   Filed Weights   06/22/21 0049 06/22/21 0538 06/23/21 0400  Weight: 96.3 kg 97.6 kg 97.6 kg    Telemetry    Sinus rhythm with rare PVCs.  Personally reviewed.  ECG    An ECG dated for 06/22/2021 was personally reviewed today and demonstrated:  Atrial fibrillation with RVR and PVC. Phys withical Exam   GEN: No acute distress.   Neck: No JVD. Cardiac: RRR, no gallop.  Respiratory: Nonlabored.  Decreased breath  sounds. GI: Soft, nontender, bowel sounds present. MS: Trace ankle edema; No deformity. Neuro:  Nonfocal. Psych: Alert and oriented x 3. Normal affect.  Labs    Chemistry Recent Labs  Lab 06/18/21 1803 06/19/21 0129 06/20/21 0348 06/21/21 0354 06/22/21 0415 06/23/21 0358  NA 139 140   < > 141 139 139  K 4.4 4.4   < > 4.1 3.8 4.1  CL 100 101   < > 101 99 97*  CO2 31 31   < > 34* 32 35*  GLUCOSE 162* 168*   < > 150* 170* 166*  BUN 13 15   < > 25* 28* 23  CREATININE 0.80 0.85   < > 0.91 0.96 0.96  CALCIUM 8.9 8.9   < > 8.8* 8.7* 8.5*  PROT 6.8 6.5  --   --   --   --   ALBUMIN 3.7 3.4*  --   --   --   --   AST 25 23  --   --   --   --   ALT 22 22  --   --   --   --   ALKPHOS 63 59  --   --   --   --  BILITOT 0.7 0.5  --   --   --   --   GFRNONAA >60 >60   < > >60 >60 >60  ANIONGAP 8 8   < > 6 8 7    < > = values in this interval not displayed.     Hematology Recent Labs  Lab 06/21/21 0354 06/22/21 0415 06/23/21 0358  WBC 18.9* 15.7* 13.0*  RBC 4.24 4.43 4.38  HGB 11.8* 12.7* 12.4*  HCT 39.9 41.6 41.0  MCV 94.1 93.9 93.6  MCH 27.8 28.7 28.3  MCHC 29.6* 30.5 30.2  RDW 14.2 14.2 13.9  PLT 252 262 241    BNP Recent Labs  Lab 06/18/21 1803  BNP 44.0     Radiology    No results found.  Assessment & Plan    1.  Newly documented atrial fibrillation with RVR, CHA2DS2-VASc score is at least 3.  He has converted back to sinus rhythm on IV amiodarone.  Otherwise continues on Cardizem CD 180 mg daily which he was taking at home for treatment of hypertension, switched from aspirin to Eliquis for stroke prophylaxis.  2.  SVT at presentation, likely reentrant mechanism and resolved with adenosine.  3.  COPD with acute on chronic hypoxic respiratory failure in the setting of exacerbation, off BiPAP and clinically stable.  Has been treated with a azithromycin, steroids, and nebulizer treatments.  4.  Essential hypertension, on Cardizem CD 180 mg daily at  home.  Plan to obtain echocardiogram now that he is back in sinus rhythm.  Continue IV amiodarone until this afternoon then would discontinue, particularly if respiratory status is felt to be at baseline.  Do not anticipate long-term use of amiodarone with his COPD.  Increase Cardizem CD to 240 mg daily.  Continue Eliquis for stroke prophylaxis.  Signed, 06/20/21, MD  06/23/2021, 8:42 AM

## 2021-06-23 NOTE — Progress Notes (Signed)
Patient received HHN with [facility] this evening and again c/o of  being very SOB and have increased difficulty breathing. RT removed medication after patient third time complain of having breathing problems after HHN.

## 2021-06-24 ENCOUNTER — Inpatient Hospital Stay (HOSPITAL_COMMUNITY): Payer: No Typology Code available for payment source

## 2021-06-24 DIAGNOSIS — I471 Supraventricular tachycardia: Secondary | ICD-10-CM | POA: Diagnosis not present

## 2021-06-24 DIAGNOSIS — J441 Chronic obstructive pulmonary disease with (acute) exacerbation: Secondary | ICD-10-CM | POA: Diagnosis not present

## 2021-06-24 DIAGNOSIS — I4891 Unspecified atrial fibrillation: Secondary | ICD-10-CM | POA: Diagnosis not present

## 2021-06-24 MED ORDER — LEVALBUTEROL HCL 0.63 MG/3ML IN NEBU
0.6300 mg | INHALATION_SOLUTION | RESPIRATORY_TRACT | Status: DC | PRN
  Administered 2021-06-27: 0.63 mg via RESPIRATORY_TRACT
  Filled 2021-06-24 (×2): qty 3

## 2021-06-24 MED ORDER — MAGNESIUM SULFATE IN D5W 1-5 GM/100ML-% IV SOLN
1.0000 g | Freq: Once | INTRAVENOUS | Status: AC
  Administered 2021-06-24: 1 g via INTRAVENOUS
  Filled 2021-06-24: qty 100

## 2021-06-24 MED ORDER — MORPHINE SULFATE (PF) 2 MG/ML IV SOLN
2.0000 mg | Freq: Once | INTRAVENOUS | Status: AC
  Administered 2021-06-24: 2 mg via INTRAVENOUS
  Filled 2021-06-24: qty 1

## 2021-06-24 MED ORDER — AMIODARONE HCL IN DEXTROSE 360-4.14 MG/200ML-% IV SOLN
INTRAVENOUS | Status: AC
  Administered 2021-06-24: 150 mg via INTRAVENOUS
  Filled 2021-06-24: qty 200

## 2021-06-24 MED ORDER — IPRATROPIUM BROMIDE 0.02 % IN SOLN
0.5000 mg | Freq: Four times a day (QID) | RESPIRATORY_TRACT | Status: DC
Start: 2021-06-24 — End: 2021-06-25
  Administered 2021-06-24: 0.5 mg via RESPIRATORY_TRACT
  Filled 2021-06-24 (×2): qty 2.5

## 2021-06-24 MED ORDER — AMIODARONE LOAD VIA INFUSION
150.0000 mg | Freq: Once | INTRAVENOUS | Status: AC
  Filled 2021-06-24: qty 83.34

## 2021-06-24 NOTE — Progress Notes (Signed)
Patient is currently not in any respiratory distress.  Patient is on 5L humidified oxygen via [place], resting in room.  Will continue to monitor.

## 2021-06-24 NOTE — Progress Notes (Signed)
PROGRESS NOTE    Jacob Pace  [id number] DOB: [birth year] DOA: 06/18/2021 PCP: [facility]   Brief Narrative:    Jacob Pace is a 71 y.o. male with medical history significant of essential hypertension, chronic respiratory failure on supplemental oxygen at 4 LPM and COPD who presents to the emergency department via EMS due to worsening shortness of breath which started last night.  He was admitted for acute on chronic hypoxemic respiratory failure secondary to COPD exacerbation.  He was showing signs of improvement, but overnight on 5/22 he went into new onset atrial fibrillation with RVR and was started on amiodarone infusion.  We will transfer to telemetry bed.  A-fib now controlled with adjusted dose of Cardizem and not requiring amiodarone.  Continue treatment for COPD exacerbation.  Assessment & Plan:   Principal Problem:   COPD exacerbation ([place]) Active Problems:   Acute on chronic respiratory failure with hypoxia (HCC)   Essential (primary) hypertension   Leukocytosis   Hyperglycemia   COPD with acute exacerbation (HCC)  Assessment and Plan:  Acute on chronic respiratory failure with hypoxia secondary to COPD exacerbation -Continue Xopenex, Mucinex and the use of flutter valve.  -Continue steroids, Pulmicort and initiate the use of Brovana. -Follow breathing status and bronchodilation response.  -Continue Protonix to prevent steroid-induced ulcer -Pulmicort and Atrovent dosage adjusted. -Patient chronically uses 4 L nasal cannula supplementation at baseline; currently requiring 5 L.   -will continue to wean down to home dose as tolerated.   -Continue the use of mucolytic's and chest physiotherapy.   Supraventricular tachycardia- resolved -Adenosine used on admission -Continue telemetry monitoring -Patient with new onset A-fib with RVR as described below. -Currently behaving as paroxysmal A-fib; currently in normal sinus rhythm. -Continue treatment  with Cardizem and and secondary prevention with the use of Eliquis.  New onset atrial fibrillation with RVR -Continue the use of Eliquis for anticoagulation -Continue adjusted dose of Cardizem as per cardiology recommendation; 240 mg daily. -Amiodarone has been now discontinued; heart rate has remained controlled and patient expressing no palpitations or chest pain.. -2D echo demonstrating no wall motion abnormalities and preserved ejection fraction.   -TSH within normal limits.   Leukocytosis possibly reactive -Currently receiving steroids -Patient is afebrile -Continue to follow WBCs trend/stability. -Holding on antibiotics at this point.    Essential hypertension (controlled) -Continue adjusted dose of diltiazem as per cardiology recommendation -Follow-up vital signs.   -Blood pressure overall stable and well-controlled. -Heart healthy diet discussed with patient.   DVT prophylaxis: Eliquis Code Status: DNI Family Communication: None at bedside Disposition Plan:  Status is: Inpatient  Remains inpatient appropriate because: Need for ongoing breathing treatments and IV steroids.     Consultants:  Cardiology   Procedures:  See below   Antimicrobials:  Anti-infectives (From admission, onward)    Start     Dose/Rate Route Frequency Ordered Stop   06/19/21 1000  azithromycin (ZITHROMAX) tablet 250 mg       See Hyperspace for full Linked Orders Report.   250 mg Oral Daily 06/18/21 2106 06/22/21 0917   06/18/21 2115  azithromycin (ZITHROMAX) tablet 500 mg       See Hyperspace for full Linked Orders Report.   500 mg Oral Daily 06/18/21 2106 06/18/21 2217       Subjective: No chest pain, no palpitations, no fever, no nausea or vomiting.  Feeling short winded, tachypneic with activity and having difficulty speaking in full sentences.  Objective: Vitals:   06/24/21  [id number] 06/24/21 0855 06/24/21 1431 06/24/21 1439  BP:   140/86   Pulse:   100   Resp:   20   Temp:   98.1  F (36.7 C)   TempSrc:      SpO2: 96% 98% 92% 93%  Weight:      Height:       No intake or output data in the 24 hours ending 06/24/21 1858  Filed Weights   06/22/21 0049 06/22/21 0538 06/23/21 0400  Weight: 96.3 kg 97.6 kg 97.6 kg    Examination: General exam: Alert, awake, oriented x 3; denying chest pain and palpitations.  Still short winded with minimal activity and having difficulty speaking in full sentences.  Requiring 5 L nasal cannula supplementation. Respiratory system: Fair air movement, positive rhonchi and bilateral expiratory wheezing.  No using accessory muscles.  Per nursing staff patient becomes tachypneic with activity. Cardiovascular system: Rate controlled, no rubs, no gallops, no JVD on exam. Gastrointestinal system: Abdomen is nondistended, soft and nontender. No organomegaly or masses felt. Normal bowel sounds heard. Central nervous system: Alert and oriented. No focal neurological deficits. Extremities: No cyanosis or clubbing. Skin: No petechiae. Psychiatry: Judgement and insight appear normal.  Flat affect.      Data Reviewed: I have personally reviewed following labs and imaging studies  CBC: Recent Labs  Lab 06/18/21 1803 06/19/21 0129 06/20/21 0348 06/21/21 0354 06/22/21 0415 06/23/21 0358  WBC 14.3* 15.7* 23.9* 18.9* 15.7* 13.0*  NEUTROABS 12.1*  --   --   --   --   --   HGB 13.8 13.0 12.7* 11.8* 12.7* 12.4*  HCT 45.0 43.6 40.7 39.9 41.6 41.0  MCV 93.4 94.8 93.1 94.1 93.9 93.6  PLT 309 276 286 252 262 [id number]   Basic Metabolic Panel: Recent Labs  Lab 06/19/21 0129 06/20/21 0348 06/21/21 0354 06/22/21 0415 06/23/21 0358  NA 140 140 141 139 139  K 4.4 4.1 4.1 3.8 4.1  CL 101 100 101 99 97*  CO2 31 32 34* 32 35*  GLUCOSE 168* 160* 150* 170* 166*  BUN 15 20 25* 28* 23  CREATININE 0.85 0.87 0.91 0.96 0.96  CALCIUM 8.9 9.1 8.8* 8.7* 8.5*  MG 2.2 2.2 2.4 2.5* 2.4  PHOS 4.0  --   --   --   --    GFR: Estimated Creatinine Clearance:  80.9 mL/min (by C-G formula based on SCr of 0.96 mg/dL).  Liver Function Tests: Recent Labs  Lab 06/18/21 1803 06/19/21 0129  AST 25 23  ALT 22 22  ALKPHOS 63 59  BILITOT 0.7 0.5  PROT 6.8 6.5  ALBUMIN 3.7 3.4*   CBG: Recent Labs  Lab 06/23/21 1605  GLUCAP 144*   Thyroid Function Tests: Recent Labs    06/22/21 0415  TSH 0.400    Recent Results (from the past 240 hour(s))  MRSA Next Gen by PCR, Nasal     Status: None   Collection Time: 06/22/21  1:02 AM   Specimen: Nasal Mucosa; Nasal Swab  Result Value Ref Range Status   MRSA by PCR Next Gen NOT DETECTED NOT DETECTED Final    Comment: (NOTE) The GeneXpert MRSA Assay (FDA approved for NASAL specimens only), is one component of a comprehensive MRSA colonization surveillance program. It is not intended to diagnose MRSA infection nor to guide or monitor treatment for MRSA infections. Test performance is not FDA approved in patients less than 28 years old. Performed at [facility], [address] [place],  [place] [address]      Radiology Studies: ECHOCARDIOGRAM COMPLETE  Result Date: 06/23/2021    ECHOCARDIOGRAM REPORT   Patient Name:   Jacob Pace Date of Exam: 06/23/2021 Medical Rec #:  [id number]       Height:       74.0 in Accession #:    [id number]      Weight:       215.2 lb Date of Birth:  [birth year]       BSA:          2.242 m Patient Age:    73 years        BP:           156/86 mmHg Patient Gender: M               HR:           93 bpm. Exam Location:  [facility] Procedure: 2D Echo, Cardiac Doppler, Color Doppler and Intracardiac            Opacification Agent Indications:    Atrial Fibrillation  History:        Patient has prior history of Echocardiogram examinations, most                 recent 02/06/2013. COPD, Arrythmias:Tachycardia; Risk                 Factors:Hypertension and Former Smoker.  Sonographer:    Wenda Low Referring Phys: [place] Comments: Image acquisition  challenging due to COPD. IMPRESSIONS  1. Left ventricular ejection fraction, by estimation, is 55 to 60%. The left ventricle has normal function. The left ventricle has no regional wall motion abnormalities. There is mild left ventricular hypertrophy. Left ventricular diastolic parameters are consistent with Grade I diastolic dysfunction (impaired relaxation).  2. Right ventricular systolic function is low normal. The right ventricular size is normal. Mildly increased right ventricular wall thickness. Tricuspid regurgitation signal is inadequate for assessing PA pressure.  3. The mitral valve is grossly normal. No evidence of mitral valve regurgitation.  4. The aortic valve is tricuspid. There is mild calcification of the aortic valve. Aortic valve regurgitation is not visualized. Aortic valve sclerosis is present, with no evidence of aortic valve stenosis. Aortic valve mean gradient measures 3.0 mmHg.  5. The inferior vena cava is normal in size with greater than 50% respiratory variability, suggesting right atrial pressure of 3 mmHg. Comparison(s): Prior images unable to be directly viewed. FINDINGS  Left Ventricle: Left ventricular ejection fraction, by estimation, is 55 to 60%. The left ventricle has normal function. The left ventricle has no regional wall motion abnormalities. Definity contrast agent was given IV to delineate the left ventricular  endocardial borders. The left ventricular internal cavity size was normal in size. There is mild left ventricular hypertrophy. Left ventricular diastolic parameters are consistent with Grade I diastolic dysfunction (impaired relaxation). Right Ventricle: The right ventricular size is normal. Mildly increased right ventricular wall thickness. Right ventricular systolic function is low normal. Tricuspid regurgitation signal is inadequate for assessing PA pressure. Left Atrium: Left atrial size was normal in size. Right Atrium: Right atrial size was normal in size.  Pericardium: There is no evidence of pericardial effusion. Presence of epicardial fat layer. Mitral Valve: The mitral valve is grossly normal. No evidence of mitral valve regurgitation. MV peak gradient, 2.8 mmHg. The mean mitral valve gradient is 1.0 mmHg. Tricuspid Valve: The tricuspid valve is grossly normal. Tricuspid valve  regurgitation is trivial. Aortic Valve: The aortic valve is tricuspid. There is mild calcification of the aortic valve. There is mild aortic valve annular calcification. Aortic valve regurgitation is not visualized. Aortic valve sclerosis is present, with no evidence of aortic valve stenosis. Aortic valve mean gradient measures 3.0 mmHg. Aortic valve peak gradient measures 6.7 mmHg. Aortic valve area, by VTI measures 2.58 cm. Pulmonic Valve: The pulmonic valve was not well visualized. Pulmonic valve regurgitation is not visualized. Aorta: The aortic root is normal in size and structure. Venous: The inferior vena cava is normal in size with greater than 50% respiratory variability, suggesting right atrial pressure of 3 mmHg. IAS/Shunts: No atrial level shunt detected by color flow Doppler.  LEFT VENTRICLE PLAX 2D LVIDd:         4.20 cm     Diastology LVIDs:         2.90 cm     LV e' medial:    7.07 cm/s LV PW:         1.20 cm     LV E/e' medial:  8.1 LV IVS:        1.30 cm     LV e' lateral:   10.20 cm/s LVOT diam:     2.00 cm     LV E/e' lateral: 5.6 LV SV:         58 LV SV Index:   26 LVOT Area:     3.14 cm  LV Volumes (MOD) LV vol d, MOD A2C: 53.3 ml LV vol d, MOD A4C: 51.2 ml LV vol s, MOD A2C: 29.6 ml LV vol s, MOD A4C: 19.9 ml LV SV MOD A2C:     23.7 ml LV SV MOD A4C:     51.2 ml LV SV MOD BP:      28.8 ml RIGHT VENTRICLE RV Basal diam:  4.20 cm RV Mid diam:    3.80 cm RV S prime:     14.90 cm/s TAPSE (M-mode): 2.5 cm LEFT ATRIUM             Index        RIGHT ATRIUM           Index LA diam:        3.70 cm 1.65 cm/m   RA Area:     18.80 cm LA Vol (A2C):   50.1 ml 22.35 ml/m  RA  Volume:   56.60 ml  25.24 ml/m LA Vol (A4C):   48.8 ml 21.77 ml/m LA Biplane Vol: 50.1 ml 22.35 ml/m  AORTIC VALVE                    PULMONIC VALVE AV Area (Vmax):    2.37 cm     PV Vmax:       0.76 m/s AV Area (Vmean):   2.26 cm     PV Peak grad:  2.3 mmHg AV Area (VTI):     2.58 cm AV Vmax:           129.00 cm/s AV Vmean:          83.500 cm/s AV VTI:            0.226 m AV Peak Grad:      6.7 mmHg AV Mean Grad:      3.0 mmHg LVOT Vmax:         97.30 cm/s LVOT Vmean:        60.100 cm/s LVOT VTI:  0.186 m LVOT/AV VTI ratio: 0.82  AORTA Ao Root diam: 3.90 cm Ao Asc diam:  3.50 cm MITRAL VALVE MV Area (PHT): 3.42 cm    SHUNTS MV Area VTI:   3.68 cm    Systemic VTI:  0.19 m MV Peak grad:  2.8 mmHg    Systemic Diam: 2.00 cm MV Mean grad:  1.0 mmHg MV Vmax:       0.84 m/s MV Vmean:      51.5 cm/s MV Decel Time: 222 msec MV E velocity: 57.40 cm/s MV A velocity: 87.80 cm/s MV E/A ratio:  0.65 Rozann Lesches MD Electronically signed by Rozann Lesches MD Signature Date/Time: 06/23/2021/12:51:50 PM    Final      Scheduled Meds:  apixaban  5 mg Oral BID   arformoterol  15 mcg Nebulization BID   budesonide (PULMICORT) nebulizer solution  0.5 mg Nebulization BID   dextromethorphan-guaiFENesin  1 tablet Oral BID   diltiazem  240 mg Oral Daily   ipratropium  0.5 mg Nebulization QID   levalbuterol  1.25 mg Nebulization TID   mouth rinse  15 mL Mouth Rinse BID   mouth rinse  15 mL Mouth Rinse BID   methylPREDNISolone (SOLU-MEDROL) injection  80 mg Intravenous Q12H   pantoprazole  40 mg Oral Daily   tamsulosin  0.4 mg Oral QPC supper   Continuous Infusions:   LOS: 5 days    Barton Dubois, MD [facility]  If 7PM-7AM, please contact night-coverage [email or web address] 06/24/2021, 6:58 PM

## 2021-06-24 NOTE — Progress Notes (Signed)
Progress Note  Patient Name: Jacob Pace Date of Encounter: 06/24/2021  Primary Cardiologist:  New to [facility] medical [facility])  Subjective   Continues to feel short of breath. No chest pain. No palpitations.   Inpatient Medications    Scheduled Meds:  apixaban  5 mg Oral BID   arformoterol  15 mcg Nebulization BID   budesonide (PULMICORT) nebulizer solution  0.5 mg Nebulization BID   dextromethorphan-guaiFENesin  1 tablet Oral BID   diltiazem  240 mg Oral Daily   ipratropium  0.5 mg Nebulization TID   levalbuterol  1.25 mg Nebulization TID   mouth rinse  15 mL Mouth Rinse BID   mouth rinse  15 mL Mouth Rinse BID   methylPREDNISolone (SOLU-MEDROL) injection  80 mg Intravenous Q12H   pantoprazole  40 mg Oral Daily   tamsulosin  0.4 mg Oral QPC supper   Continuous Infusions:  magnesium sulfate bolus IVPB 1 g (06/24/21 0929)    PRN Meds: acetaminophen, levalbuterol   Vital Signs    Vitals:   06/24/21 0514 06/24/21 0811 06/24/21 0813 06/24/21 0855  BP: (!) 141/89     Pulse: 93     Resp: 19     Temp: (!) 97.3 F (36.3 C)     TempSrc: Oral     SpO2: 95% 92% 96% 98%  Weight:      Height:       No intake or output data in the 24 hours ending 06/24/21 1018  Filed Weights   06/22/21 0049 06/22/21 0538 06/23/21 0400  Weight: 96.3 kg 97.6 kg 97.6 kg    Telemetry    Sinus rhythm/sinus tach; occasional PVCs.  Personally reviewed.  ECG    No new tracing  Phys withical Exam   GEN: Sitting up in bed, comfortable Neck: No JVD. Cardiac: Tachycardic, regular, no murmurs Respiratory: Diminished throughout. Fair air moveemnt GI: Soft, nontender, bowel sounds present. MS: Trace ankle edema; No deformity. Neuro:  Nonfocal. Psych: Alert and oriented x 3. Normal affect.  Labs    Chemistry Recent Labs  Lab 06/18/21 1803 06/19/21 0129 06/20/21 0348 06/21/21 0354 06/22/21 0415 06/23/21 0358  NA 139 140   < > 141 139 139  K 4.4 4.4   < > 4.1 3.8 4.1  CL  100 101   < > 101 99 97*  CO2 31 31   < > 34* 32 35*  GLUCOSE 162* 168*   < > 150* 170* 166*  BUN 13 15   < > 25* 28* 23  CREATININE 0.80 0.85   < > 0.91 0.96 0.96  CALCIUM 8.9 8.9   < > 8.8* 8.7* 8.5*  PROT 6.8 6.5  --   --   --   --   ALBUMIN 3.7 3.4*  --   --   --   --   AST 25 23  --   --   --   --   ALT 22 22  --   --   --   --   ALKPHOS 63 59  --   --   --   --   BILITOT 0.7 0.5  --   --   --   --   GFRNONAA >60 >60   < > >60 >60 >60  ANIONGAP 8 8   < > 6 8 7    < > = values in this interval not displayed.     Hematology Recent Labs  Lab 06/21/21 0354 06/22/21  [id number] 06/23/21 0358  WBC 18.9* 15.7* 13.0*  RBC 4.24 4.43 4.38  HGB 11.8* 12.7* 12.4*  HCT 39.9 41.6 41.0  MCV 94.1 93.9 93.6  MCH 27.8 28.7 28.3  MCHC 29.6* 30.5 30.2  RDW 14.2 14.2 13.9  PLT 252 262 241    BNP Recent Labs  Lab 06/18/21 1803  BNP 44.0     Radiology    ECHOCARDIOGRAM COMPLETE  Result Date: 06/23/2021    ECHOCARDIOGRAM REPORT   Patient Name:   Jacob Pace Date of Exam: 06/23/2021 Medical Rec #:  [id number]       Height:       74.0 in Accession #:    [id number]      Weight:       215.2 lb Date of Birth:  06-22-1950       BSA:          2.242 m Patient Age:    71 years        BP:           156/86 mmHg Patient Gender: M               HR:           93 bpm. Exam Location:  [facility] Procedure: 2D Echo, Cardiac Doppler, Color Doppler and Intracardiac            Opacification Agent Indications:    Atrial Fibrillation  History:        Patient has prior history of Echocardiogram examinations, most                 recent 02/06/2013. COPD, Arrythmias:Tachycardia; Risk                 Factors:Hypertension and Former Smoker.  Sonographer:    Wenda Low Referring Phys: [place] Comments: Image acquisition challenging due to COPD. IMPRESSIONS  1. Left ventricular ejection fraction, by estimation, is 55 to 60%. The left ventricle has normal function. The left ventricle has no  regional wall motion abnormalities. There is mild left ventricular hypertrophy. Left ventricular diastolic parameters are consistent with Grade I diastolic dysfunction (impaired relaxation).  2. Right ventricular systolic function is low normal. The right ventricular size is normal. Mildly increased right ventricular wall thickness. Tricuspid regurgitation signal is inadequate for assessing PA pressure.  3. The mitral valve is grossly normal. No evidence of mitral valve regurgitation.  4. The aortic valve is tricuspid. There is mild calcification of the aortic valve. Aortic valve regurgitation is not visualized. Aortic valve sclerosis is present, with no evidence of aortic valve stenosis. Aortic valve mean gradient measures 3.0 mmHg.  5. The inferior vena cava is normal in size with greater than 50% respiratory variability, suggesting right atrial pressure of 3 mmHg. Comparison(s): Prior images unable to be directly viewed. FINDINGS  Left Ventricle: Left ventricular ejection fraction, by estimation, is 55 to 60%. The left ventricle has normal function. The left ventricle has no regional wall motion abnormalities. Definity contrast agent was given IV to delineate the left ventricular  endocardial borders. The left ventricular internal cavity size was normal in size. There is mild left ventricular hypertrophy. Left ventricular diastolic parameters are consistent with Grade I diastolic dysfunction (impaired relaxation). Right Ventricle: The right ventricular size is normal. Mildly increased right ventricular wall thickness. Right ventricular systolic function is low normal. Tricuspid regurgitation signal is inadequate for assessing PA pressure. Left Atrium: Left atrial size was normal in size.  Right Atrium: Right atrial size was normal in size. Pericardium: There is no evidence of pericardial effusion. Presence of epicardial fat layer. Mitral Valve: The mitral valve is grossly normal. No evidence of mitral valve  regurgitation. MV peak gradient, 2.8 mmHg. The mean mitral valve gradient is 1.0 mmHg. Tricuspid Valve: The tricuspid valve is grossly normal. Tricuspid valve regurgitation is trivial. Aortic Valve: The aortic valve is tricuspid. There is mild calcification of the aortic valve. There is mild aortic valve annular calcification. Aortic valve regurgitation is not visualized. Aortic valve sclerosis is present, with no evidence of aortic valve stenosis. Aortic valve mean gradient measures 3.0 mmHg. Aortic valve peak gradient measures 6.7 mmHg. Aortic valve area, by VTI measures 2.58 cm. Pulmonic Valve: The pulmonic valve was not well visualized. Pulmonic valve regurgitation is not visualized. Aorta: The aortic root is normal in size and structure. Venous: The inferior vena cava is normal in size with greater than 50% respiratory variability, suggesting right atrial pressure of 3 mmHg. IAS/Shunts: No atrial level shunt detected by color flow Doppler.  LEFT VENTRICLE PLAX 2D LVIDd:         4.20 cm     Diastology LVIDs:         2.90 cm     LV e' medial:    7.07 cm/s LV PW:         1.20 cm     LV E/e' medial:  8.1 LV IVS:        1.30 cm     LV e' lateral:   10.20 cm/s LVOT diam:     2.00 cm     LV E/e' lateral: 5.6 LV SV:         58 LV SV Index:   26 LVOT Area:     3.14 cm  LV Volumes (MOD) LV vol d, MOD A2C: 53.3 ml LV vol d, MOD A4C: 51.2 ml LV vol s, MOD A2C: 29.6 ml LV vol s, MOD A4C: 19.9 ml LV SV MOD A2C:     23.7 ml LV SV MOD A4C:     51.2 ml LV SV MOD BP:      28.8 ml RIGHT VENTRICLE RV Basal diam:  4.20 cm RV Mid diam:    3.80 cm RV S prime:     14.90 cm/s TAPSE (M-mode): 2.5 cm LEFT ATRIUM             Index        RIGHT ATRIUM           Index LA diam:        3.70 cm 1.65 cm/m   RA Area:     18.80 cm LA Vol (A2C):   50.1 ml 22.35 ml/m  RA Volume:   56.60 ml  25.24 ml/m LA Vol (A4C):   48.8 ml 21.77 ml/m LA Biplane Vol: 50.1 ml 22.35 ml/m  AORTIC VALVE                    PULMONIC VALVE AV Area (Vmax):    2.37  cm     PV Vmax:       0.76 m/s AV Area (Vmean):   2.26 cm     PV Peak grad:  2.3 mmHg AV Area (VTI):     2.58 cm AV Vmax:           129.00 cm/s AV Vmean:          83.500 cm/s AV VTI:  0.226 m AV Peak Grad:      6.7 mmHg AV Mean Grad:      3.0 mmHg LVOT Vmax:         97.30 cm/s LVOT Vmean:        60.100 cm/s LVOT VTI:          0.186 m LVOT/AV VTI ratio: 0.82  AORTA Ao Root diam: 3.90 cm Ao Asc diam:  3.50 cm MITRAL VALVE MV Area (PHT): 3.42 cm    SHUNTS MV Area VTI:   3.68 cm    Systemic VTI:  0.19 m MV Peak grad:  2.8 mmHg    Systemic Diam: 2.00 cm MV Mean grad:  1.0 mmHg MV Vmax:       0.84 m/s MV Vmean:      51.5 cm/s MV Decel Time: 222 msec MV E velocity: 57.40 cm/s MV A velocity: 87.80 cm/s MV E/A ratio:  0.65 Rozann Lesches MD Electronically signed by Rozann Lesches MD Signature Date/Time: 06/23/2021/12:51:50 PM    Final     Assessment & Plan    #Newly documented atrial fibrillation with RVR: CHA2DS2-VASc score is at least 3.  Developed in the setting of acute on chronic COPD exacerbation as detailed below. TTE 06/23/21 with LVEF 55-60%, G1DD, low normal RV systolic function with normal RV size, no significant valve disease, normal atrial sizes, RAP 39mmhg. Now back in NSR following IV amiodarone. Has since been transitioned to dilt 240mg  daily and remains in NSR today. Aspirin changed to apixaban 5mg  BID for stroke prevention. -Continue dilt 240mg  daily -Continue apixaban 5mg  BID -TTE with preserved EF 55-60%, no significant valve disease  #SVT: Occurred on admission. Likely reentrant mechanism and resolved with adenosine.  #Acute on Chronic COPD exacerbation: #Acute on Chronic Hypoxic Respiratory Failure: Initially presented with worsening respiratory distress found to have acute on chronic COPD exacerbation. Required BiPAP which has since been weaned to [facility]. Has been treated with a azithromycin, steroids, and nebulizer treatments. -Management per primary -On 4L [place] chronically at  home  #Essential hypertension: -Continue diltiazem 240mg  daily as above   Signed, Freada Bergeron, MD  06/24/2021, 10:18 AM

## 2021-06-24 NOTE — Progress Notes (Signed)
Was called to patient's room earlier due to RN stating patient felt like a weight on his chest and was having trouble breathing.  Came down and was about to give patient breathing treatment.  Patient was still hooked up to dinamap and HR was reading around 150s and 160s.  I suggested that we might need to get an EKG on patient.  Rapid response was called on patient and Dr. Herma Carson came in to look at patient and EKG.  Patient was started on Amiodarone and we transported patient to ICU12.  Patient was transported on 6L [place].  Sats have been stable throughout.  ICU staff at bedside with patient.

## 2021-06-25 DIAGNOSIS — J9621 Acute and chronic respiratory failure with hypoxia: Secondary | ICD-10-CM | POA: Diagnosis not present

## 2021-06-25 DIAGNOSIS — I471 Supraventricular tachycardia: Secondary | ICD-10-CM | POA: Diagnosis not present

## 2021-06-25 DIAGNOSIS — I480 Paroxysmal atrial fibrillation: Secondary | ICD-10-CM | POA: Diagnosis not present

## 2021-06-25 DIAGNOSIS — J441 Chronic obstructive pulmonary disease with (acute) exacerbation: Secondary | ICD-10-CM | POA: Diagnosis not present

## 2021-06-25 LAB — BASIC METABOLIC PANEL
Anion gap: 18 — ABNORMAL HIGH (ref 5–15)
BUN: 34 mg/dL — ABNORMAL HIGH (ref 8–23)
CO2: 30 mmol/L (ref 22–32)
Calcium: 8.8 mg/dL — ABNORMAL LOW (ref 8.9–10.3)
Chloride: 88 mmol/L — ABNORMAL LOW (ref 98–111)
Creatinine, Ser: 1.48 mg/dL — ABNORMAL HIGH (ref 0.61–1.24)
GFR, Estimated: 50 mL/min — ABNORMAL LOW (ref >60)
Glucose, Bld: 190 mg/dL — ABNORMAL HIGH (ref 70–99)
Potassium: 3.9 mmol/L (ref 3.5–5.1)
Sodium: 136 mmol/L (ref 135–145)

## 2021-06-25 LAB — COMPREHENSIVE METABOLIC PANEL
ALT: 21 U/L (ref 0–44)
AST: 19 U/L (ref 15–41)
Albumin: 3.6 g/dL (ref 3.5–5.0)
Alkaline Phosphatase: 49 U/L (ref 38–126)
Anion gap: 12 (ref 5–15)
BUN: 21 mg/dL (ref 8–23)
CO2: 31 mmol/L (ref 22–32)
Calcium: 8.9 mg/dL (ref 8.9–10.3)
Chloride: 94 mmol/L — ABNORMAL LOW (ref 98–111)
Creatinine, Ser: 0.95 mg/dL (ref 0.61–1.24)
GFR, Estimated: >60 mL/min (ref >60)
Glucose, Bld: 180 mg/dL — ABNORMAL HIGH (ref 70–99)
Potassium: 4.1 mmol/L (ref 3.5–5.1)
Sodium: 137 mmol/L (ref 135–145)
Total Bilirubin: 1 mg/dL (ref 0.3–1.2)
Total Protein: 6.6 g/dL (ref 6.5–8.1)

## 2021-06-25 LAB — MAGNESIUM
Magnesium: 2.6 mg/dL — ABNORMAL HIGH (ref 1.7–2.4)
Magnesium: 2.3 mg/dL (ref 1.7–2.4)

## 2021-06-25 LAB — GLUCOSE, CAPILLARY
Glucose-Capillary: 147 mg/dL — ABNORMAL HIGH (ref 70–99)
Glucose-Capillary: 156 mg/dL — ABNORMAL HIGH (ref 70–99)

## 2021-06-25 LAB — TROPONIN I (HIGH SENSITIVITY)
Troponin I (High Sensitivity): 9 ng/L (ref <18)
Troponin I (High Sensitivity): 11 ng/L (ref <18)

## 2021-06-25 MED ORDER — CHLORHEXIDINE GLUCONATE CLOTH 2 % EX PADS
6.0000 | MEDICATED_PAD | Freq: Every day | CUTANEOUS | Status: DC
  Administered 2021-06-26 – 2021-06-27 (×2): 6 via TOPICAL

## 2021-06-25 MED ORDER — MORPHINE SULFATE (PF) 2 MG/ML IV SOLN
2.0000 mg | INTRAVENOUS | Status: DC | PRN
  Administered 2021-06-25: 2 mg via INTRAVENOUS
  Filled 2021-06-25: qty 1

## 2021-06-25 MED ORDER — ADENOSINE 6 MG/2ML IV SOLN
INTRAVENOUS | Status: AC
  Administered 2021-06-25: 6 mg
  Filled 2021-06-25: qty 2

## 2021-06-25 MED ORDER — METOPROLOL TARTRATE 25 MG PO TABS
12.5000 mg | ORAL_TABLET | Freq: Two times a day (BID) | ORAL | Status: DC
  Administered 2021-06-25 (×2): 12.5 mg via ORAL
  Filled 2021-06-25 (×2): qty 1

## 2021-06-25 MED ORDER — FUROSEMIDE 10 MG/ML IJ SOLN
40.0000 mg | Freq: Two times a day (BID) | INTRAMUSCULAR | Status: AC
  Administered 2021-06-25 – 2021-06-26 (×3): 40 mg via INTRAVENOUS
  Filled 2021-06-25 (×3): qty 4

## 2021-06-25 MED ORDER — ADENOSINE 6 MG/2ML IV SOLN: 6.0000 mg | Freq: Once | INTRAVENOUS | Status: DC

## 2021-06-25 MED ORDER — REVEFENACIN 175 MCG/3ML IN SOLN
175.0000 ug | Freq: Every day | RESPIRATORY_TRACT | Status: DC
  Administered 2021-06-27 – 2021-06-28 (×2): 175 ug via RESPIRATORY_TRACT
  Filled 2021-06-25 (×2): qty 3

## 2021-06-25 MED ORDER — METOPROLOL TARTRATE 25 MG PO TABS
12.5000 mg | ORAL_TABLET | Freq: Once | ORAL | Status: AC
  Administered 2021-06-25: 12.5 mg via ORAL
  Filled 2021-06-25: qty 1

## 2021-06-25 MED ORDER — ADENOSINE 6 MG/2ML IV SOLN
6.0000 mg | Freq: Once | INTRAVENOUS | Status: AC
  Administered 2021-06-25: 6 mg via INTRAVENOUS
  Filled 2021-06-25: qty 2

## 2021-06-25 MED ORDER — IPRATROPIUM BROMIDE 0.02 % IN SOLN
0.5000 mg | RESPIRATORY_TRACT | Status: DC | PRN
  Administered 2021-06-27: 0.5 mg via RESPIRATORY_TRACT
  Filled 2021-06-25: qty 2.5

## 2021-06-25 NOTE — Progress Notes (Signed)
Progress Note  Patient Name: Deronte Rindlisbacher Engelbrecht Date of Encounter: 06/25/2021  Primary Cardiologist:  New to [facility])  Subjective   Did not feel Afib, breathing no better, no sleep in 3 days due to SOB  Inpatient Medications    Scheduled Meds:  apixaban  5 mg Oral BID   arformoterol  15 mcg Nebulization BID   budesonide (PULMICORT) nebulizer solution  0.5 mg Nebulization BID   Chlorhexidine Gluconate Cloth  6 each Topical Daily   dextromethorphan-guaiFENesin  1 tablet Oral BID   diltiazem  240 mg Oral Daily   furosemide  40 mg Intravenous Q12H   ipratropium  0.5 mg Nebulization QID   levalbuterol  1.25 mg Nebulization TID   mouth rinse  15 mL Mouth Rinse BID   mouth rinse  15 mL Mouth Rinse BID   methylPREDNISolone (SOLU-MEDROL) injection  80 mg Intravenous Q12H   pantoprazole  40 mg Oral Daily   tamsulosin  0.4 mg Oral QPC supper   Continuous Infusions:    PRN Meds: acetaminophen, levalbuterol   Vital Signs    Vitals:   06/25/21 0400 06/25/21 0700 06/25/21 0746 06/25/21 0800  BP:  125/83  (!) 142/62  Pulse:  97  97  Resp:  (!) 26  20  Temp: 97.6 F (36.4 C)  97.9 F (36.6 C)   TempSrc: Oral  Oral   SpO2:  93%  93%  Weight:      Height:        Intake/Output Summary (Last 24 hours) at 06/25/2021 0820 Last data filed at 06/25/2021 0600 Gross per 24 hour  Intake --  Output 75 ml  Net -75 ml    Filed Weights   06/22/21 0049 06/22/21 0538 06/23/21 0400  Weight: 96.3 kg 97.6 kg 97.6 kg    Telemetry    ST >> Rapid Afib >> SR Personally reviewed.  ECG    No new tracing  Phys withical Exam   GEN: Sitting up in resp distress Neck: No JVD Cardiac: RRR, no murmur, no rubs, or gallops.  Respiratory: diminished to auscultation bilaterally with rales in the bases. Not much air exchange GI: Soft, nontender, non-distended  MS: No edema; No deformity. Neuro:  Nonfocal  Psych: Normal affect   Labs    Chemistry Recent Labs  Lab  06/18/21 1803 06/19/21 0129 06/20/21 0348 06/22/21 0415 06/23/21 0358 06/24/21 2321  NA 139 140   < > 139 139 137  K 4.4 4.4   < > 3.8 4.1 4.1  CL 100 101   < > 99 97* 94*  CO2 31 31   < > 32 35* 31  GLUCOSE 162* 168*   < > 170* 166* 180*  BUN 13 15   < > 28* 23 21  CREATININE 0.80 0.85   < > 0.96 0.96 0.95  CALCIUM 8.9 8.9   < > 8.7* 8.5* 8.9  PROT 6.8 6.5  --   --   --  6.6  ALBUMIN 3.7 3.4*  --   --   --  3.6  AST 25 23  --   --   --  19  ALT 22 22  --   --   --  21  ALKPHOS 63 59  --   --   --  49  BILITOT 0.7 0.5  --   --   --  1.0  GFRNONAA >60 >60   < > >60 >60 >60  ANIONGAP 8 8   < >  8 7 12    < > = values in this interval not displayed.     Hematology Recent Labs  Lab 06/21/21 0354 06/22/21 0415 06/23/21 0358  WBC 18.9* 15.7* 13.0*  RBC 4.24 4.43 4.38  HGB 11.8* 12.7* 12.4*  HCT 39.9 41.6 41.0  MCV 94.1 93.9 93.6  MCH 27.8 28.7 28.3  MCHC 29.6* 30.5 30.2  RDW 14.2 14.2 13.9  PLT 252 262 241    BNP Recent Labs  Lab 06/18/21 1803  BNP 44.0     Cardiac studies   ECHO: 06/23/2021  1. Left ventricular ejection fraction, by estimation, is 55 to 60%. The  left ventricle has normal function. The left ventricle has no regional  wall motion abnormalities. There is mild left ventricular hypertrophy.  Left ventricular diastolic parameters  are consistent with Grade I diastolic dysfunction (impaired relaxation).   2. Right ventricular systolic function is low normal. The right  ventricular size is normal. Mildly increased right ventricular wall  thickness. Tricuspid regurgitation signal is inadequate for assessing PA  pressure.   3. The mitral valve is grossly normal. No evidence of mitral valve  regurgitation.   4. The aortic valve is tricuspid. There is mild calcification of the  aortic valve. Aortic valve regurgitation is not visualized. Aortic valve  sclerosis is present, with no evidence of aortic valve stenosis. Aortic  valve mean gradient measures  3.0 mmHg.   5. The inferior vena cava is normal in size with greater than 50%  respiratory variability, suggesting right atrial pressure of 3 mmHg.   Comparison(s): Prior images unable to be directly viewed.   Radiology    DG CHEST PORT 1 VIEW  Result Date: 06/25/2021 CLINICAL DATA:  [id number].  Chest pain, dyspnea. EXAM: PORTABLE CHEST 1 VIEW COMPARISON:  None Available. FINDINGS: The lungs are mildly hyperinflated with a paucity of vasculature within the apices in keeping with changes of underlying emphysema. Central pulmonary vascular congestion persists and there is developing basilar predominant interstitial pulmonary infiltrate suggesting developing interstitial pulmonary edema superimposed upon underlying interstitial lung disease. No pneumothorax or pleural effusion. Cardiac size within normal limits., IMPRESSION: Emphysema. Central pulmonary vascular congestion with suspected developing mild interstitial pulmonary edema. Electronically Signed   By: Fidela Salisbury M.D.   On: 06/25/2021 00:14   ECHOCARDIOGRAM COMPLETE  Result Date: 06/23/2021    ECHOCARDIOGRAM REPORT   Patient Name:   ZABDIEL BROWNRIDGE Date of Exam: 06/23/2021 Medical Rec #:  [id number]       Height:       74.0 in Accession #:    [id number]      Weight:       215.2 lb Date of Birth:  09/20/1950       BSA:          2.242 m Patient Age:    71 years        BP:           156/86 mmHg Patient Gender: M               HR:           93 bpm. [facility]am Location:  [facility] Procedure: 2D Echo, Cardiac Doppler, Color Doppler and Intracardiac            Opacification Agent Indications:    Atrial Fibrillation  History:        Patient has prior history of Echocardiogram examinations, most  recent 02/06/2013. COPD, Arrythmias:Tachycardia; Risk                 Factors:Hypertension and Former Smoker.  Sonographer:    Wenda Low Referring Phys: [place] Comments: Image acquisition challenging due to COPD.  IMPRESSIONS  1. Left ventricular ejection fraction, by estimation, is 55 to 60%. The left ventricle has normal function. The left ventricle has no regional wall motion abnormalities. There is mild left ventricular hypertrophy. Left ventricular diastolic parameters are consistent with Grade I diastolic dysfunction (impaired relaxation).  2. Right ventricular systolic function is low normal. The right ventricular size is normal. Mildly increased right ventricular wall thickness. Tricuspid regurgitation signal is inadequate for assessing PA pressure.  3. The mitral valve is grossly normal. No evidence of mitral valve regurgitation.  4. The aortic valve is tricuspid. There is mild calcification of the aortic valve. Aortic valve regurgitation is not visualized. Aortic valve sclerosis is present, with no evidence of aortic valve stenosis. Aortic valve mean gradient measures 3.0 mmHg.  5. The inferior vena cava is normal in size with greater than 50% respiratory variability, suggesting right atrial pressure of 3 mmHg. Comparison(s): Prior images unable to be directly viewed. FINDINGS  Left Ventricle: Left ventricular ejection fraction, by estimation, is 55 to 60%. The left ventricle has normal function. The left ventricle has no regional wall motion abnormalities. Definity contrast agent was given IV to delineate the left ventricular  endocardial borders. The left ventricular internal cavity size was normal in size. There is mild left ventricular hypertrophy. Left ventricular diastolic parameters are consistent with Grade I diastolic dysfunction (impaired relaxation). Right Ventricle: The right ventricular size is normal. Mildly increased right ventricular wall thickness. Right ventricular systolic function is low normal. Tricuspid regurgitation signal is inadequate for assessing PA pressure. Left Atrium: Left atrial size was normal in size. Right Atrium: Right atrial size was normal in size. Pericardium: There is no  evidence of pericardial effusion. Presence of epicardial fat layer. Mitral Valve: The mitral valve is grossly normal. No evidence of mitral valve regurgitation. MV peak gradient, 2.8 mmHg. The mean mitral valve gradient is 1.0 mmHg. Tricuspid Valve: The tricuspid valve is grossly normal. Tricuspid valve regurgitation is trivial. Aortic Valve: The aortic valve is tricuspid. There is mild calcification of the aortic valve. There is mild aortic valve annular calcification. Aortic valve regurgitation is not visualized. Aortic valve sclerosis is present, with no evidence of aortic valve stenosis. Aortic valve mean gradient measures 3.0 mmHg. Aortic valve peak gradient measures 6.7 mmHg. Aortic valve area, by VTI measures 2.58 cm. Pulmonic Valve: The pulmonic valve was not well visualized. Pulmonic valve regurgitation is not visualized. Aorta: The aortic root is normal in size and structure. Venous: The inferior vena cava is normal in size with greater than 50% respiratory variability, suggesting right atrial pressure of 3 mmHg. IAS/Shunts: No atrial level shunt detected by color flow Doppler.  LEFT VENTRICLE PLAX 2D LVIDd:         4.20 cm     Diastology LVIDs:         2.90 cm     LV e' medial:    7.07 cm/s LV PW:         1.20 cm     LV E/e' medial:  8.1 LV IVS:        1.30 cm     LV e' lateral:   10.20 cm/s LVOT diam:     2.00 cm  LV E/e' lateral: 5.6 LV SV:         58 LV SV Index:   26 LVOT Area:     3.14 cm  LV Volumes (MOD) LV vol d, MOD A2C: 53.3 ml LV vol d, MOD A4C: 51.2 ml LV vol s, MOD A2C: 29.6 ml LV vol s, MOD A4C: 19.9 ml LV SV MOD A2C:     23.7 ml LV SV MOD A4C:     51.2 ml LV SV MOD BP:      28.8 ml RIGHT VENTRICLE RV Basal diam:  4.20 cm RV Mid diam:    3.80 cm RV S prime:     14.90 cm/s TAPSE (M-mode): 2.5 cm LEFT ATRIUM             Index        RIGHT ATRIUM           Index LA diam:        3.70 cm 1.65 cm/m   RA Area:     18.80 cm LA Vol (A2C):   50.1 ml 22.35 ml/m  RA Volume:   56.60 ml  25.24  ml/m LA Vol (A4C):   48.8 ml 21.77 ml/m LA Biplane Vol: 50.1 ml 22.35 ml/m  AORTIC VALVE                    PULMONIC VALVE AV Area (Vmax):    2.37 cm     PV Vmax:       0.76 m/s AV Area (Vmean):   2.26 cm     PV Peak grad:  2.3 mmHg AV Area (VTI):     2.58 cm AV Vmax:           129.00 cm/s AV Vmean:          83.500 cm/s AV VTI:            0.226 m AV Peak Grad:      6.7 mmHg AV Mean Grad:      3.0 mmHg LVOT Vmax:         97.30 cm/s LVOT Vmean:        60.100 cm/s LVOT VTI:          0.186 m LVOT/AV VTI ratio: 0.82  AORTA Ao Root diam: 3.90 cm Ao Asc diam:  3.50 cm MITRAL VALVE MV Area (PHT): 3.42 cm    SHUNTS MV Area VTI:   3.68 cm    Systemic VTI:  0.19 m MV Peak grad:  2.8 mmHg    Systemic Diam: 2.00 cm MV Mean grad:  1.0 mmHg MV Vmax:       0.84 m/s MV Vmean:      51.5 cm/s MV Decel Time: 222 msec MV E velocity: 57.40 cm/s MV A velocity: 87.80 cm/s MV E/A ratio:  0.65 Rozann Lesches MD Electronically signed by Rozann Lesches MD Signature Date/Time: 06/23/2021/12:51:50 PM    Final     Assessment & Plan    #Newly documented atrial fibrillation with RVR: CHA2DS2-VASc score is at least 3. -  Developed in the setting of acute on chronic COPD exacerbation as detailed below. - TTE 06/23/21 with LVEF 55-60%, G1DD, low normal RV systolic function with normal RV size, no significant valve disease, normal atrial sizes, RAP 45mmhg.  - was back in NSR following IV amiodarone but had another episode rapid afib and got an IV amio bolus.  - Had since been transitioned to dilt 240mg  daily  - Aspirin changed  to apixaban 5mg  BID for stroke prevention. -Continue dilt 240mg  daily -Continue apixaban 5mg  BID -TTE with preserved EF 55-60%, no significant valve disease - with recurrent Afib, may need to continue amio, discuss w/ MD  #SVT: - Occurred on admission. Likely reentrant mechanism and resolved with adenosine. - although HR high at times, still looks irregular so think all is afib  #Acute on Chronic COPD  exacerbation: #Acute on Chronic Hypoxic Respiratory Failure: - Initially presented with worsening respiratory distress found to have acute on chronic COPD exacerbation.  - Required BiPAP which has since been weaned to [facility]. - Has been treated with a azithromycin, steroids, and nebulizer treatments. -Management per primary -On 4L [place] chronically at home - pt states not getting better  #Essential hypertension: -Continue diltiazem 240mg  daily as above   Signed, Rosaria Ferries, PA-C  06/25/2021, 8:20 AM

## 2021-06-25 NOTE — Progress Notes (Signed)
PROGRESS NOTE    Jacob Pace  [id number] DOB: 24-May-1950 DOA: 06/18/2021 PCP: [facility]   Brief Narrative:    Jacob Pace is a 71 y.o. male with medical history significant of essential hypertension, chronic respiratory failure on supplemental oxygen at 4 LPM and COPD who presents to the emergency department via EMS due to worsening shortness of breath which started last night.  He was admitted for acute on chronic hypoxemic respiratory failure secondary to COPD exacerbation.  He was showing signs of improvement, but overnight on 5/22 he went into new onset atrial fibrillation with RVR and was started on amiodarone infusion.  We will transfer to telemetry bed.  A-fib now controlled with adjusted dose of Cardizem and not requiring amiodarone.  Continue treatment for COPD exacerbation.  Assessment & Plan:   Principal Problem:   COPD exacerbation ([place]) Active Problems:   Acute on chronic respiratory failure with hypoxia (HCC)   Essential (primary) hypertension   Leukocytosis   Hyperglycemia   COPD with acute exacerbation (HCC)  Assessment and Plan:  Acute on chronic respiratory failure with hypoxia secondary to COPD exacerbation -Continue Xopenex, Mucinex and the use of flutter valve.  -Continue steroids, Pulmicort and initiate the use of Brovana. -Follow breathing status and bronchodilation response.  -Continue Protonix to prevent steroid-induced ulcer -Continue adjusted dose of Pulmicort; patient will be started on yulperi. -Patient chronically uses 4 L nasal cannula supplementation at baseline; currently requiring 5-6 L.   -will continue to wean down to home dose as tolerated.   -Continue the use of mucolytic's and chest physiotherapy.   Supraventricular tachycardia- resolved -Adenosine used on admission and another dose provided on 06/24/2021 at nighttime after experiencing uncontrolled episode of SVT. -Continue telemetry monitoring -Patient with new  onset A-fib with RVR as described below. -Currently behaving as paroxysmal A-fib; currently in normal sinus rhythm. -Continue treatment with Cardizem and and secondary prevention with the use of Eliquis. -Continue to follow cardiology service recommendations.  New onset atrial fibrillation with RVR -Continue the use of Eliquis for anticoagulation -Continue adjusted dose of Cardizem as per cardiology recommendation; 240 mg daily. -Amiodarone has been once again discontinue; heart rate has remained controlled and patient expressing no palpitations or chest pain currently. -2D echo demonstrating no wall motion abnormalities and preserved ejection fraction.   -TSH within normal limits.   Leukocytosis possibly reactive -Currently receiving steroids -Patient is afebrile -Continue to follow WBCs trend/stability. -Continue holding on antibiotics at this point.    Essential hypertension (controlled) -Continue adjusted dose of diltiazem as per cardiology recommendation -Continue to follow-up vital signs.   -Blood pressure overall stable and well-controlled. -Heart healthy diet discussed with patient.  Interstitial edema/acute diastolic heart failure -Most likely triggered by arrhythmia -Will provide treatment with IV Lasix, continue management with Cardizem and Eliquis.   DVT prophylaxis: Eliquis Code Status: DNI Family Communication: None at bedside Disposition Plan:  Status is: Inpatient  Remains inpatient appropriate because: Need for ongoing breathing treatments and IV steroids.     Consultants:  Cardiology   Procedures:  See below   Antimicrobials:  Anti-infectives (From admission, onward)    Start     Dose/Rate Route Frequency Ordered Stop   06/19/21 1000  azithromycin (ZITHROMAX) tablet 250 mg       See Hyperspace for full Linked Orders Report.   250 mg Oral Daily 06/18/21 2106 06/22/21 0917   06/18/21 2115  azithromycin (ZITHROMAX) tablet 500 mg       See  Hyperspace  for full Linked Orders Report.   500 mg Oral Daily 06/18/21 2106 06/18/21 2217       Subjective: Overnight with worsening respiratory distress, palpitations, orthopnea and increased in the need of oxygen supplementation.  Patient is afebrile.  He was transferred to stepdown and initiated on amiodarone drip after receiving a dose of adenosine.  Objective: Vitals:   06/25/21 1525 06/25/21 1600 06/25/21 1740 06/25/21 1800  BP:  (!) 172/81 139/89 (!) 172/102  Pulse:  88 (!) 103 (!) 102  Resp:   19 19  Temp: 98.1 F (36.7 C)     TempSrc: Oral     SpO2:  92% 93% 91%  Weight:      Height:        Intake/Output Summary (Last 24 hours) at 06/25/2021 1902 Last data filed at 06/25/2021 1730 Gross per 24 hour  Intake 240 ml  Output 1125 ml  Net -885 ml    Filed Weights   06/22/21 0049 06/22/21 0538 06/23/21 0400  Weight: 96.3 kg 97.6 kg 97.6 kg    Examination: General exam: Alert, awake, oriented x 3; with acute overnight event of respiratory distress and SVT.  Transfer to stepdown for adenosine and further escalation in oxygen supplementation.  Patient reports associated orthopnea and no relieving by nebulizer management. Respiratory system: Decreased breath sounds at the bases, positive rhonchi, positive expiratory wheezing.  Appreciated tachypnea. Cardiovascular system: Rate controlled currently; no rubs, no gallops, no JVD on exam. Gastrointestinal system: Abdomen is nondistended, soft and nontender. No organomegaly or masses felt. Normal bowel sounds heard. Central nervous system: Alert and oriented. No focal neurological deficits. Extremities: No cyanosis or clubbing. Skin: No petechiae. Psychiatry: Judgement and insight appear normal. Mood & affect appropriate.    Data Reviewed: I have personally reviewed following labs and imaging studies  CBC: Recent Labs  Lab 06/19/21 0129 06/20/21 0348 06/21/21 0354 06/22/21 0415 06/23/21 0358  WBC 15.7* 23.9* 18.9* 15.7* 13.0*   HGB 13.0 12.7* 11.8* 12.7* 12.4*  HCT 43.6 40.7 39.9 41.6 41.0  MCV 94.8 93.1 94.1 93.9 93.6  PLT 276 286 252 262 [id number]   Basic Metabolic Panel: Recent Labs  Lab 06/19/21 0129 06/20/21 0348 06/21/21 0354 06/22/21 0415 06/23/21 0358 06/24/21 2321  NA 140 140 141 139 139 137  K 4.4 4.1 4.1 3.8 4.1 4.1  CL 101 100 101 99 97* 94*  CO2 31 32 34* 32 35* 31  GLUCOSE 168* 160* 150* 170* 166* 180*  BUN 15 20 25* 28* 23 21  CREATININE 0.85 0.87 0.91 0.96 0.96 0.95  CALCIUM 8.9 9.1 8.8* 8.7* 8.5* 8.9  MG 2.2 2.2 2.4 2.5* 2.4 2.6*  PHOS 4.0  --   --   --   --   --    GFR: Estimated Creatinine Clearance: 81.7 mL/min (by C-G formula based on SCr of 0.95 mg/dL).  Liver Function Tests: Recent Labs  Lab 06/19/21 0129 06/24/21 2321  AST 23 19  ALT 22 21  ALKPHOS 59 49  BILITOT 0.5 1.0  PROT 6.5 6.6  ALBUMIN 3.4* 3.6   CBG: Recent Labs  Lab 06/23/21 1605 06/25/21 1127 06/25/21 1523  GLUCAP 144* 147* 156*    Recent Results (from the past 240 hour(s))  MRSA Next Gen by PCR, Nasal     Status: None   Collection Time: 06/22/21  1:02 AM   Specimen: Nasal Mucosa; Nasal Swab  Result Value Ref Range Status   MRSA by PCR Next Gen NOT DETECTED  NOT DETECTED Final    Comment: (NOTE) The GeneXpert MRSA Assay (FDA approved for NASAL specimens only), is one component of a comprehensive MRSA colonization surveillance program. It is not intended to diagnose MRSA infection nor to guide or monitor treatment for MRSA infections. Test performance is not FDA approved in patients less than 2 years old. Performed at [facility], [address], [place], [place] [address]      Radiology Studies: DG CHEST PORT 1 VIEW  Result Date: 06/25/2021 CLINICAL DATA:  [id number].  Chest pain, dyspnea. EXAM: PORTABLE CHEST 1 VIEW COMPARISON:  None Available. FINDINGS: The lungs are mildly hyperinflated with a paucity of vasculature within the apices in keeping with changes of underlying emphysema. Central  pulmonary vascular congestion persists and there is developing basilar predominant interstitial pulmonary infiltrate suggesting developing interstitial pulmonary edema superimposed upon underlying interstitial lung disease. No pneumothorax or pleural effusion. Cardiac size within normal limits., IMPRESSION: Emphysema. Central pulmonary vascular congestion with suspected developing mild interstitial pulmonary edema. Electronically Signed   By: Fidela Salisbury M.D.   On: 06/25/2021 00:14    Scheduled Meds:  apixaban  5 mg Oral BID   arformoterol  15 mcg Nebulization BID   budesonide (PULMICORT) nebulizer solution  0.5 mg Nebulization BID   Chlorhexidine Gluconate Cloth  6 each Topical Daily   dextromethorphan-guaiFENesin  1 tablet Oral BID   diltiazem  240 mg Oral Daily   furosemide  40 mg Intravenous Q12H   levalbuterol  1.25 mg Nebulization TID   mouth rinse  15 mL Mouth Rinse BID   mouth rinse  15 mL Mouth Rinse BID   methylPREDNISolone (SOLU-MEDROL) injection  80 mg Intravenous Q12H   metoprolol tartrate  12.5 mg Oral BID   pantoprazole  40 mg Oral Daily   [START ON 06/26/2021] revefenacin  175 mcg Nebulization Daily   tamsulosin  0.4 mg Oral QPC supper   Continuous Infusions:   LOS: 6 days    Barton Dubois, MD [facility]  If 7PM-7AM, please contact night-coverage [email or web address] 06/25/2021, 7:02 PM

## 2021-06-25 NOTE — Plan of Care (Signed)
  Problem: Respiratory: Goal: Ability to maintain adequate ventilation will improve  Note: Patient complains of continued shortness of breath and states he has not been able to get any sleep due to shortness of breath and difficulty breathing.  Patient's oxygen saturation 94% but patient shows signs of increased work of breathing.  Patient's heart rate NSR at 96.  Patient states nebulizer treatments do not help with his shortness of breath.  RN to discuss with MD during rounds.

## 2021-06-25 NOTE — Progress Notes (Signed)
Pt c/o increased SOB. This nurse along with another RN and NT went to assist pt. Respirations elevated and pt states he feels like he has an weight on his chest. MD made aware of complaints and assessment data. Rapid response called due to HR sustaining in 150's. EKG obtained. MD present and recommends step down. Pt started on amiodarone drip by ICU nurse while in route to ICU12. Pt made it to ICU and report given to receiving nurse by previous nurse, pt continues to remain on 6L of 02 during transport.

## 2021-06-25 NOTE — Progress Notes (Signed)
Rapid response called for chest pain and shortness of breath.  Patient had acute onset of chest pain and shortness of breath.  He reports that the chest pain feels like pressure like bricks or sitting on his chest.  He feels like he cannot get enough air.  His oxygen saturations were fine but respiratory turned of his flow.  Morphine helped him symptomatically.  EKG had a lot of artifact due to the increased work of breathing, but monitor was reading a heart rate  in the low 200s, and rhythm was irregular on exam.  Amiodarone bolus and infusion ordered.  Patient transferred to the ICU.  Amnio bolus, heart rate came down to 160s.  Repeat EKG showed SVT.  Patient was symptomatically a lot better but still feeling short of breath.  Adenosine 6 mg was given.  Heart rate came down to 90s and sustained there.  Patient reports that he was feeling much better after that.  Amiodarone infusion continued.  In the midst of all this patient was initially borderline hypotensive with a systolic blood pressure in the 90s.  After adenosine and correction of heart rate blood pressure in the 1 teens.  We will continue to monitor.  CRITICAL CARE Performed by: [place]   Total critical care time: 40 minutes  Critical care time was exclusive of separately billable procedures and treating other patients.  Critical care was necessary to treat or prevent imminent or life-threatening deterioration.  Critical care was time spent personally by me on the following activities: development of treatment plan with patient and/or surrogate as well as nursing, discussions with consultants, evaluation of patient's response to treatment, examination of patient, obtaining history from patient or surrogate, ordering and performing treatments and interventions, ordering and review of laboratory studies, ordering and review of radiographic studies, pulse oximetry and re-evaluation of patient's condition.

## 2021-06-26 DIAGNOSIS — J441 Chronic obstructive pulmonary disease with (acute) exacerbation: Secondary | ICD-10-CM | POA: Diagnosis not present

## 2021-06-26 DIAGNOSIS — I480 Paroxysmal atrial fibrillation: Secondary | ICD-10-CM | POA: Diagnosis not present

## 2021-06-26 DIAGNOSIS — J9621 Acute and chronic respiratory failure with hypoxia: Secondary | ICD-10-CM | POA: Diagnosis not present

## 2021-06-26 DIAGNOSIS — I471 Supraventricular tachycardia: Secondary | ICD-10-CM | POA: Diagnosis not present

## 2021-06-26 LAB — BASIC METABOLIC PANEL
Anion gap: 11 (ref 5–15)
BUN: 42 mg/dL — ABNORMAL HIGH (ref 8–23)
CO2: 35 mmol/L — ABNORMAL HIGH (ref 22–32)
Calcium: 8.5 mg/dL — ABNORMAL LOW (ref 8.9–10.3)
Chloride: 91 mmol/L — ABNORMAL LOW (ref 98–111)
Creatinine, Ser: 1.58 mg/dL — ABNORMAL HIGH (ref 0.61–1.24)
GFR, Estimated: 46 mL/min — ABNORMAL LOW (ref >60)
Glucose, Bld: 174 mg/dL — ABNORMAL HIGH (ref 70–99)
Potassium: 4.4 mmol/L (ref 3.5–5.1)
Sodium: 137 mmol/L (ref 135–145)

## 2021-06-26 LAB — CBC
HCT: 42 % (ref 39.0–52.0)
Hemoglobin: 12.9 g/dL — ABNORMAL LOW (ref 13.0–17.0)
MCH: 28.9 pg (ref 26.0–34.0)
MCHC: 30.7 g/dL (ref 30.0–36.0)
MCV: 94 fL (ref 80.0–100.0)
Platelets: 204 10*3/uL (ref 150–400)
RBC: 4.47 MIL/uL (ref 4.22–5.81)
RDW: 14 % (ref 11.5–15.5)
WBC: 25.5 10*3/uL — ABNORMAL HIGH (ref 4.0–10.5)
nRBC: 0 % (ref 0.0–0.2)

## 2021-06-26 MED ORDER — METOPROLOL TARTRATE 25 MG PO TABS
25.0000 mg | ORAL_TABLET | Freq: Two times a day (BID) | ORAL | Status: DC
  Administered 2021-06-26 – 2021-06-29 (×7): 25 mg via ORAL
  Filled 2021-06-26 (×7): qty 1

## 2021-06-26 MED ORDER — METHYLPREDNISOLONE SODIUM SUCC 40 MG IJ SOLR
40.0000 mg | Freq: Two times a day (BID) | INTRAMUSCULAR | Status: DC
  Administered 2021-06-26 – 2021-06-28 (×4): 40 mg via INTRAVENOUS
  Filled 2021-06-26 (×4): qty 1

## 2021-06-26 MED ORDER — MORPHINE SULFATE (PF) 2 MG/ML IV SOLN
2.0000 mg | INTRAVENOUS | Status: DC | PRN
  Filled 2021-06-26: qty 1

## 2021-06-26 MED ORDER — FUROSEMIDE 10 MG/ML IJ SOLN
40.0000 mg | Freq: Two times a day (BID) | INTRAMUSCULAR | Status: AC
  Administered 2021-06-26 – 2021-06-27 (×3): 40 mg via INTRAVENOUS
  Filled 2021-06-26 (×3): qty 4

## 2021-06-26 NOTE — Progress Notes (Addendum)
Again tonight, paged to bedside for SVT.  Heart rate 167.  Patient feeling short of breath.  Wheezing on exam, but refusing breathing treatments.  Cardiology note reports SVT may be related to breathing treatments, but his last one was more than 12 hours prior to this event.  EKG done showing SVT.  Adenosine 6 mg given.  Patient's heart rate returned to high 90s low 100s.  Increased evening beta-blocker dose to 25 mg metoprolol.  Continue to monitor. CRITICAL CARE Performed by: [place]   Total critical care time: 30 minutes  Critical care time was exclusive of separately billable procedures and treating other patients.  Critical care was necessary to treat or prevent imminent or life-threatening deterioration.  Critical care was time spent personally by me on the following activities: development of treatment plan with patient and/or surrogate as well as nursing, discussions with consultants, evaluation of patient's response to treatment, examination of patient, obtaining history from patient or surrogate, ordering and performing treatments and interventions, ordering and review of laboratory studies, ordering and review of radiographic studies, pulse oximetry and re-evaluation of patient's condition.

## 2021-06-26 NOTE — Progress Notes (Signed)
At 2141pm pt went into SVT HR 167. Dr. Carren Rang was secure chatted. EKG obtained. Adenosine 6mg  given. Pt HR stabilized. PRN morphine and extra 12.5mg  dose of metoprolol ordered and given. Pt has no current complaints.

## 2021-06-26 NOTE — Progress Notes (Signed)
Patient declined breathing treatments. Currently on 5 lpm nasal cannula at 92%. Told patient to call if he wanted treatment. Patient has been refusing treatments all day.

## 2021-06-26 NOTE — Progress Notes (Signed)
PROGRESS NOTE    Jacob Pace  [id number] DOB: 10/31/1950 DOA: 06/18/2021 [facility]CP: [facility]   Brief Narrative:    Jacob Pace is a 71 y.o. male with medical history significant of essential hypertension, chronic respiratory failure on supplemental oxygen at 4 LPM and COPD who presents to the emergency department via EMS due to worsening shortness of breath which started last night.  He was admitted for acute on chronic hypoxemic respiratory failure secondary to COPD exacerbation.  He was showing signs of improvement, but overnight on 5/22 he went into new onset atrial fibrillation with RVR and was started on amiodarone infusion.  We will transfer to telemetry bed.  A-fib now controlled with adjusted dose of Cardizem and not requiring amiodarone.  Continue treatment for COPD exacerbation.  Assessment & Plan:   Principal Problem:   COPD exacerbation (HCC) Active Problems:   Acute on chronic respiratory failure with hypoxia (HCC)   Essential (primary) hypertension   Leukocytosis   Hyperglycemia   COPD with acute exacerbation (HCC)  Assessment and Plan:  Acute on chronic respiratory failure with hypoxia secondary to COPD exacerbation -Continue Xopenex, Mucinex and the use of flutter valve.  -Continue steroids, Pulmicort and initiate the use of Brovana. -Follow breathing status and bronchodilation response.  -Continue Protonix to prevent steroid-induced ulcer -Continue adjusted dose of Pulmicort and continue started yulperi. -Instructed to use flutter valve and incentive spirometer. -Patient chronically uses 4 L nasal cannula supplementation at baseline; currently requiring 5-6 L.   -will continue to wean down to home dose as tolerated.   -Continue the use of mucolytic's and chest physiotherapy.   Supraventricular tachycardia/paroxysmal atrial fibrillation -Adenosine used on admission and another dose provided on 5/25 and 5/26 at nighttime after experiencing  uncontrolled episode of SVT. -Continue telemetry monitoring -Patient with new onset A-fib with RVR as described below. -Currently behaving as paroxysmal A-fib; currently in normal sinus rhythm. -Continue treatment with Cardizem and adjusted dose of metoprolol. -Continue the use of Eliquis for secondary prevention.   -Continue to follow cardiology service recommendations.  New onset atrial fibrillation with RVR -Continue the use of Eliquis for anticoagulation -Continue adjusted dose of Cardizem as per cardiology recommendation; 240 mg daily. -Patient metoprolol dose has also been adjusted. -No amiodarone drip has been required in the last 24 hours.. -2D echo demonstrating no wall motion abnormalities and preserved ejection fraction.   -TSH within normal limits.   Leukocytosis appears to be reactive -Currently receiving steroids -Patient is afebrile -Continue to follow WBCs trend/stability. -Continue holding on antibiotics at this point.    Essential hypertension (controlled) -Continue adjusted dose of diltiazem as per cardiology recommendation -Continue to follow-up vital signs.   -Blood pressure overall stable and well-controlled. -Heart healthy diet discussed with patient.  Interstitial edema/acute diastolic heart failure -Most likely triggered by arrhythmia -Will continue treatment with IV Lasix, continue  -Repeat chest x-ray in a.m. -Management with Cardizem, metoprolol and Eliquis for arrhythmia component.  Overweight -Body mass index is 27.63 kg/m. -Low calorie diet, portion control and increase physical activity discussed with patient.   DVT prophylaxis: Eliquis Code Status: DNI Family Communication: None at bedside Disposition Plan:  Status is: Inpatient  Remains inpatient appropriate because: Need for ongoing breathing treatments and IV steroids.     Consultants:  Cardiology   Procedures:  See below   Antimicrobials:  Anti-infectives (From admission,  onward)    Start     Dose/Rate Route Frequency Ordered Stop   06/19/21 1000  azithromycin (ZITHROMAX) tablet 250 mg       See Hyperspace for full Linked Orders Report.   250 mg Oral Daily 06/18/21 2106 06/22/21 0917   06/18/21 2115  azithromycin (ZITHROMAX) tablet 500 mg       See Hyperspace for full Linked Orders Report.   500 mg Oral Daily 06/18/21 2106 06/18/21 2217       Subjective: Feeling slightly better currently; overnight complaining of another episodes of SVT/palpitations and worsening breathing.  Adenosine was required.  No chest pain, no nausea, no vomiting, afebrile.  Objective: Vitals:   06/26/21 0900 06/26/21 1000 06/26/21 1100 06/26/21 1119  BP: 120/80 131/84 124/80   Pulse: 95 99 100   Resp: (!) 22 (!) 28 (!) 37   Temp:    97.6 F (36.4 C)  TempSrc:    Oral  SpO2: 96% 95% 93%   Weight:      Height:        Intake/Output Summary (Last 24 hours) at 06/26/2021 1127 Last data filed at 06/26/2021 1024 Gross per 24 hour  Intake 480 ml  Output 1590 ml  Net -1110 ml    Filed Weights   06/22/21 0049 06/22/21 0538 06/23/21 0400  Weight: 96.3 kg 97.6 kg 97.6 kg    Examination: General exam: Alert, awake, oriented x 3; having difficulty speaking in full sentences and feeling short winded with activity. Respiratory system: Positive rhonchi bilaterally; mild expiratory wheezing appreciated.  Fair air movement.  Fine crackles at the bases.  Patient experiencing tachypnea with exertion. Cardiovascular system: Regular rate at time of examination; no rubs, no gallops, no JVD on exam. Gastrointestinal system: Abdomen is obese, nondistended, soft and nontender. No organomegaly or masses felt. Normal bowel sounds heard. Central nervous system: Alert and oriented. No focal neurological deficits. Extremities: No cyanosis or clubbing Skin: No petechiae. Psychiatry: Judgement and insight appear normal. Mood & affect appropriate.   Data Reviewed: I have personally reviewed  following labs and imaging studies  CBC: Recent Labs  Lab 06/20/21 0348 06/21/21 0354 06/22/21 0415 06/23/21 0358 06/26/21 0407  WBC 23.9* 18.9* 15.7* 13.0* 25.5*  HGB 12.7* 11.8* 12.7* 12.4* 12.9*  HCT 40.7 39.9 41.6 41.0 42.0  MCV 93.1 94.1 93.9 93.6 94.0  PLT 286 252 262 241 204   Basic Metabolic Panel: Recent Labs  Lab 06/21/21 0354 06/22/21 0415 06/23/21 0358 06/24/21 2321 06/25/21 2232 06/26/21 0407  NA 141 139 139 137 136 137  K 4.1 3.8 4.1 4.1 3.9 4.4  CL 101 99 97* 94* 88* 91*  CO2 34* 32 35* 31 30 35*  GLUCOSE 150* 170* 166* 180* 190* 174*  BUN 25* 28* 23 21 34* 42*  CREATININE 0.91 0.96 0.96 0.95 1.48* 1.58*  CALCIUM 8.8* 8.7* 8.5* 8.9 8.8* 8.5*  MG 2.4 2.5* 2.4 2.6* 2.3  --    GFR: Estimated Creatinine Clearance: 49.1 mL/min (A) (by C-G formula based on SCr of 1.58 mg/dL (H)).  Liver Function Tests: Recent Labs  Lab 06/24/21 2321  AST 19  ALT 21  ALKPHOS 49  BILITOT 1.0  PROT 6.6  ALBUMIN 3.6   CBG: Recent Labs  Lab 06/23/21 1605 06/25/21 1127 06/25/21 1523  GLUCAP 144* 147* 156*    Recent Results (from the past 240 hour(s))  MRSA Next Gen by PCR, Nasal     Status: None   Collection Time: 06/22/21  1:02 AM   Specimen: Nasal Mucosa; Nasal Swab  Result Value Ref Range Status   MRSA by PCR  Next Gen NOT DETECTED NOT DETECTED Final    Comment: (NOTE) The GeneXpert MRSA Assay (FDA approved for NASAL specimens only), is one component of a comprehensive MRSA colonization surveillance program. It is not intended to diagnose MRSA infection nor to guide or monitor treatment for MRSA infections. Test performance is not FDA approved in patients less than 27 years old. Performed at [facility], [address], [place], [place] [address]      Radiology Studies: DG CHEST PORT 1 VIEW  Result Date: 06/25/2021 CLINICAL DATA:  [id number].  Chest pain, dyspnea. EXAM: PORTABLE CHEST 1 VIEW COMPARISON:  None Available. FINDINGS: The lungs are mildly  hyperinflated with a paucity of vasculature within the apices in keeping with changes of underlying emphysema. Central pulmonary vascular congestion persists and there is developing basilar predominant interstitial pulmonary infiltrate suggesting developing interstitial pulmonary edema superimposed upon underlying interstitial lung disease. No pneumothorax or pleural effusion. Cardiac size within normal limits., IMPRESSION: Emphysema. Central pulmonary vascular congestion with suspected developing mild interstitial pulmonary edema. Electronically Signed   By: Fidela Salisbury M.D.   On: 06/25/2021 00:14    Scheduled Meds:  adenosine (ADENOCARD) IV  6 mg Intravenous Once   apixaban  5 mg Oral BID   arformoterol  15 mcg Nebulization BID   budesonide (PULMICORT) nebulizer solution  0.5 mg Nebulization BID   Chlorhexidine Gluconate Cloth  6 each Topical Daily   dextromethorphan-guaiFENesin  1 tablet Oral BID   diltiazem  240 mg Oral Daily   levalbuterol  1.25 mg Nebulization TID   mouth rinse  15 mL Mouth Rinse BID   mouth rinse  15 mL Mouth Rinse BID   methylPREDNISolone (SOLU-MEDROL) injection  80 mg Intravenous Q12H   metoprolol tartrate  25 mg Oral BID   pantoprazole  40 mg Oral Daily   revefenacin  175 mcg Nebulization Daily   tamsulosin  0.4 mg Oral QPC supper   Continuous Infusions:   LOS: 7 days    Barton Dubois, MD [facility]  If 7PM-7AM, please contact night-coverage [email or web address] 06/26/2021, 11:27 AM

## 2021-06-26 NOTE — Plan of Care (Signed)
  Problem: Activity: Goal: Ability to tolerate increased activity will improve Outcome: Not Progressing   Problem: Respiratory: Goal: Ability to maintain adequate ventilation will improve Outcome: Not Progressing   Note: Patient continues to refuse scheduled breathing treatments.   RN encouraged patient to perform incentive spirometer and flutter valve multiple times through out shift and patient repeatedly stated "I'll do it later".   RN asked patient at approximately 1730 if patient had done the flutter valve or incentive spirometer and he stated he had not and advised RN he didn't feel like doing it when RN asked him to do the flutter valve and/or incentive spirometer at that time.  RN educated patient on the importance of performing these breathing exercises but patient continued to refuse to complete them.

## 2021-06-27 ENCOUNTER — Inpatient Hospital Stay (HOSPITAL_COMMUNITY): Payer: No Typology Code available for payment source

## 2021-06-27 DIAGNOSIS — J9621 Acute and chronic respiratory failure with hypoxia: Secondary | ICD-10-CM | POA: Diagnosis not present

## 2021-06-27 DIAGNOSIS — I480 Paroxysmal atrial fibrillation: Secondary | ICD-10-CM | POA: Diagnosis not present

## 2021-06-27 DIAGNOSIS — J441 Chronic obstructive pulmonary disease with (acute) exacerbation: Secondary | ICD-10-CM | POA: Diagnosis not present

## 2021-06-27 DIAGNOSIS — I471 Supraventricular tachycardia: Secondary | ICD-10-CM | POA: Diagnosis not present

## 2021-06-27 LAB — COMPREHENSIVE METABOLIC PANEL
ALT: 22 U/L (ref 0–44)
AST: 28 U/L (ref 15–41)
Albumin: 3 g/dL — ABNORMAL LOW (ref 3.5–5.0)
Alkaline Phosphatase: 48 U/L (ref 38–126)
Anion gap: 12 (ref 5–15)
BUN: 52 mg/dL — ABNORMAL HIGH (ref 8–23)
CO2: 36 mmol/L — ABNORMAL HIGH (ref 22–32)
Calcium: 8.7 mg/dL — ABNORMAL LOW (ref 8.9–10.3)
Chloride: 90 mmol/L — ABNORMAL LOW (ref 98–111)
Creatinine, Ser: 1.41 mg/dL — ABNORMAL HIGH (ref 0.61–1.24)
GFR, Estimated: 53 mL/min — ABNORMAL LOW (ref >60)
Glucose, Bld: 175 mg/dL — ABNORMAL HIGH (ref 70–99)
Potassium: 4.3 mmol/L (ref 3.5–5.1)
Sodium: 138 mmol/L (ref 135–145)
Total Bilirubin: 1.2 mg/dL (ref 0.3–1.2)
Total Protein: 6.3 g/dL — ABNORMAL LOW (ref 6.5–8.1)

## 2021-06-27 LAB — BLOOD GAS, ARTERIAL
Acid-Base Excess: 12.1 mmol/L — ABNORMAL HIGH (ref 0.0–2.0)
Acid-Base Excess: 12.5 mmol/L — ABNORMAL HIGH (ref 0.0–2.0)
Acid-Base Excess: 15.7 mmol/L — ABNORMAL HIGH (ref 0.0–2.0)
Bicarbonate: 44.1 mmol/L — ABNORMAL HIGH (ref 20.0–28.0)
Bicarbonate: 44 mmol/L — ABNORMAL HIGH (ref 20.0–28.0)
Bicarbonate: 46.4 mmol/L — ABNORMAL HIGH (ref 20.0–28.0)
Drawn by: 38235
Drawn by: 38235
Drawn by: 22052
FIO2: 100 %
FIO2: 90 %
O2 Saturation: 98.8 %
O2 Saturation: 88.9 %
O2 Saturation: 99.3 %
Patient temperature: 37
Patient temperature: 37
Patient temperature: 36.7
pCO2 arterial: 103 mmHg (ref 32–48)
pCO2 arterial: 98 mmHg (ref 32–48)
pCO2 arterial: 89 mmHg (ref 32–48)
pH, Arterial: 7.24 — ABNORMAL LOW (ref 7.35–7.45)
pH, Arterial: 7.26 — ABNORMAL LOW (ref 7.35–7.45)
pH, Arterial: 7.32 — ABNORMAL LOW (ref 7.35–7.45)
pO2, Arterial: 107 mmHg (ref 83–108)
pO2, Arterial: 61 mmHg — ABNORMAL LOW (ref 83–108)
pO2, Arterial: 142 mmHg — ABNORMAL HIGH (ref 83–108)

## 2021-06-27 LAB — CBC
HCT: 42.7 % (ref 39.0–52.0)
Hemoglobin: 12.7 g/dL — ABNORMAL LOW (ref 13.0–17.0)
MCH: 28.5 pg (ref 26.0–34.0)
MCHC: 29.7 g/dL — ABNORMAL LOW (ref 30.0–36.0)
MCV: 95.7 fL (ref 80.0–100.0)
Platelets: 219 10*3/uL (ref 150–400)
RBC: 4.46 MIL/uL (ref 4.22–5.81)
RDW: 13.8 % (ref 11.5–15.5)
WBC: 19.8 10*3/uL — ABNORMAL HIGH (ref 4.0–10.5)
nRBC: 0 % (ref 0.0–0.2)

## 2021-06-27 LAB — GLUCOSE, CAPILLARY: Glucose-Capillary: 174 mg/dL — ABNORMAL HIGH (ref 70–99)

## 2021-06-27 LAB — PROCALCITONIN: Procalcitonin: 0.86 ng/mL

## 2021-06-27 LAB — TROPONIN I (HIGH SENSITIVITY): Troponin I (High Sensitivity): 21 ng/L — ABNORMAL HIGH (ref <18)

## 2021-06-27 LAB — MAGNESIUM: Magnesium: 2.6 mg/dL — ABNORMAL HIGH (ref 1.7–2.4)

## 2021-06-27 LAB — D-DIMER, QUANTITATIVE: D-Dimer, Quant: 0.51 ug/mL-FEU — ABNORMAL HIGH (ref 0.00–0.50)

## 2021-06-27 MED ORDER — ORAL CARE MOUTH RINSE
15.0000 mL | Freq: Two times a day (BID) | OROMUCOSAL | Status: DC
  Administered 2021-06-27 – 2021-06-29 (×3): 15 mL via OROMUCOSAL

## 2021-06-27 MED ORDER — FUROSEMIDE 10 MG/ML IJ SOLN
INTRAMUSCULAR | Status: AC
  Filled 2021-06-27: qty 4

## 2021-06-27 MED ORDER — ONDANSETRON HCL 4 MG/2ML IJ SOLN: 4.0000 mg | Freq: Once | INTRAMUSCULAR | Status: AC

## 2021-06-27 MED ORDER — CHLORHEXIDINE GLUCONATE 0.12 % MT SOLN
15.0000 mL | Freq: Two times a day (BID) | OROMUCOSAL | Status: DC
  Administered 2021-06-27 – 2021-06-28 (×3): 15 mL via OROMUCOSAL
  Filled 2021-06-27 (×3): qty 15

## 2021-06-27 MED ORDER — FUROSEMIDE 10 MG/ML IJ SOLN
40.0000 mg | Freq: Once | INTRAMUSCULAR | Status: AC
  Administered 2021-06-27: 40 mg via INTRAVENOUS

## 2021-06-27 MED ORDER — ONDANSETRON HCL 4 MG/2ML IJ SOLN
INTRAMUSCULAR | Status: AC
  Administered 2021-06-27: 4 mg via INTRAVENOUS
  Filled 2021-06-27: qty 2

## 2021-06-27 NOTE — Progress Notes (Signed)
RT called to patient's room for emergent situation. Upon arrival 2 RN's were bagging patient because he was unresponsive and agonal breathing with low O2 sats. RT and RN took over bagging. Patient is a Partial Code. Monitor from crash cart was placed on patient and back board placed in case of arrest. MD in room. EKG, chest xray, ABG and labs were obtained. Patient placed on BIPAP per MD. Patient given PRN Xopenex and Atrovent breathing treatment. Will continue to monitor.

## 2021-06-27 NOTE — Progress Notes (Signed)
Bladder scan showing 369 mL of urine in bladder.  Patient states he does not feel any fullness in his bladder or the need to void.  In and Out catheter not completed at this time, RN to continue to monitor for spontaneous void and/or complete bladder scan if no spontaneous void or patient complains of needing to void and being unable to.

## 2021-06-27 NOTE — Progress Notes (Signed)
Patient's FIO2 was decreased back to 70% post ABG with PO2 142. RT will continue to monitor

## 2021-06-27 NOTE — Progress Notes (Signed)
PROGRESS NOTE    Jacob Pace  [id number] DOB: 1950-12-14 DOA: 06/18/2021 PCP: [facility]   Brief Narrative:    Jacob Pace is a 71 y.o. male with medical history significant of essential hypertension, chronic respiratory failure on supplemental oxygen at 4 LPM and COPD who presents to the emergency department via EMS due to worsening shortness of breath which started last night.  He was admitted for acute on chronic hypoxemic respiratory failure secondary to COPD exacerbation.  He was showing signs of improvement, but overnight on 5/22 he went into new onset atrial fibrillation with RVR and was started on amiodarone infusion.  We will transfer to telemetry bed.  A-fib now controlled with adjusted dose of Cardizem and not requiring amiodarone.  Continue treatment for COPD exacerbation.  Assessment & Plan:   Principal Problem:   COPD exacerbation ([place]) Active Problems:   Acute on chronic respiratory failure with hypoxia (HCC)   Essential (primary) hypertension   Leukocytosis   Hyperglycemia   COPD with acute exacerbation (HCC)  Assessment and Plan:  Acute on chronic respiratory failure with hypoxia and hypercapnia secondary to COPD exacerbation -Continue Xopenex, Mucinex and the use of flutter valve.  -Continue steroids, Pulmicort and initiate the use of Brovana. -Follow breathing status and bronchodilation response.  -Continue Protonix to prevent steroid-induced ulcer -Continue adjusted dose of Pulmicort and continue started yulperi. -Instructed to use flutter valve and incentive spirometer. -Patient chronically uses 4 L nasal cannula supplementation at baseline. -Continue to wean off BiPAP as tolerated.   -will continue to wean down to home dose as tolerated.   -Continue the use of mucolytic's and chest physiotherapy. -Follow ABG.  Supraventricular tachycardia/paroxysmal atrial fibrillation -Adenosine used on admission and another dose provided on 5/25  and 5/26 at nighttime after experiencing uncontrolled episode of SVT. -Continue telemetry monitoring -Rate controlled currently. -Patient with new onset A-fib with RVR as described below. -Currently behaving as paroxysmal A-fib; currently in normal sinus rhythm. -Continue treatment with Cardizem and adjusted dose of metoprolol. -Continue the use of Eliquis for secondary prevention.   -Continue to follow cardiology service recommendations.  New onset atrial fibrillation with RVR -Continue the use of Eliquis for anticoagulation -Continue adjusted dose of Cardizem as per cardiology recommendation; 240 mg daily. -Patient metoprolol dose has also been adjusted. -No amiodarone drip has been required in the last 36 hours.. -2D echo demonstrating no wall motion abnormalities and preserved ejection fraction.   -TSH within normal limits.   Leukocytosis appears to be reactive -Currently receiving steroids -Patient is afebrile -Continue to follow WBCs trend/stability. -Continue holding on antibiotics at this point.    Essential hypertension (controlled) -Continue adjusted dose of diltiazem as per cardiology recommendation -Continue to follow-up vital signs.   -Blood pressure overall stable and well-controlled. -Heart healthy diet discussed with patient.  Interstitial edema/acute diastolic heart failure -Most likely triggered by arrhythmia -Will continue treatment with IV Lasix, continue to follow daily weights/strict I's and O's. -Repeat chest x-ray demonstrating vascular congestion and chronic emphysematous changes.  No infiltrates. -Continue management with Cardizem, metoprolol and Eliquis for arrhythmia component.  Overweight -Body mass index is 27.63 kg/m. -Low calorie diet, portion control and increase physical activity discussed with patient.   DVT prophylaxis: Eliquis Code Status: DNI Family Communication: None at bedside Disposition Plan:  Status is: Inpatient  Remains  inpatient appropriate because: Need for ongoing breathing treatments and IV steroids.     Consultants:  Cardiology   Procedures:  See below  Antimicrobials:  Anti-infectives (From admission, onward)    Start     Dose/Rate Route Frequency Ordered Stop   06/19/21 1000  azithromycin (ZITHROMAX) tablet 250 mg       See Hyperspace for full Linked Orders Report.   250 mg Oral Daily 06/18/21 2106 06/22/21 0917   06/18/21 2115  azithromycin (ZITHROMAX) tablet 500 mg       See Hyperspace for full Linked Orders Report.   500 mg Oral Daily 06/18/21 2106 06/18/21 2217       Subjective: No chest pain, no nausea, no vomiting.  Overnight with acute respiratory distress in the setting of hypoxia and hypercapnia requiring BiPAP placement.  Objective: Vitals:   06/27/21 1100 06/27/21 1130 06/27/21 1134 06/27/21 1200  BP: (!) 101/58 112/64  114/68  Pulse: 77 79 80 80  Resp: 18 (!) 21 (!) 23 19  Temp:      TempSrc:      SpO2: 95% 95% 97% 96%  Weight:      Height:        Intake/Output Summary (Last 24 hours) at 06/27/2021 1348 Last data filed at 06/27/2021 1130 Gross per 24 hour  Intake 580 ml  Output 1340 ml  Net -760 ml    Filed Weights   06/22/21 0049 06/22/21 0538 06/23/21 0400  Weight: 96.3 kg 97.6 kg 97.6 kg    Examination: General exam: Sleepy and currently on BiPAP.  Overnight with acute respiratory distress due to hypercapnia requiring BiPAP placement.  No fever no chest pain. Respiratory system: Positive rhonchi bilaterally, fine crackles at the bases.  Mild expiratory wheezing appreciated. Cardiovascular system: Rate controlled, no rubs, no gallops, no JVD. Gastrointestinal system: Abdomen is obese, nondistended, soft and nontender. No organomegaly or masses felt. Normal bowel sounds heard. Central nervous system: Alert and oriented. No focal neurological deficits. Extremities: No cyanosis or clubbing. Skin: No petechiae. Psychiatry: Appropriate affect.  Data  Reviewed: I have personally reviewed following labs and imaging studies  CBC: Recent Labs  Lab 06/21/21 0354 06/22/21 0415 06/23/21 0358 06/26/21 0407 06/27/21 0247  WBC 18.9* 15.7* 13.0* 25.5* 19.8*  HGB 11.8* 12.7* 12.4* 12.9* 12.7*  HCT 39.9 41.6 41.0 42.0 42.7  MCV 94.1 93.9 93.6 94.0 95.7  PLT 252 262 241 204 [id number]   Basic Metabolic Panel: Recent Labs  Lab 06/22/21 0415 06/23/21 0358 06/24/21 2321 06/25/21 2232 06/26/21 0407 06/27/21 0247  NA 139 139 137 136 137 138  K 3.8 4.1 4.1 3.9 4.4 4.3  CL 99 97* 94* 88* 91* 90*  CO2 32 35* 31 30 35* 36*  GLUCOSE 170* 166* 180* 190* 174* 175*  BUN 28* 23 21 34* 42* 52*  CREATININE 0.96 0.96 0.95 1.48* 1.58* 1.41*  CALCIUM 8.7* 8.5* 8.9 8.8* 8.5* 8.7*  MG 2.5* 2.4 2.6* 2.3  --  2.6*   GFR: Estimated Creatinine Clearance: 55.1 mL/min (A) (by C-G formula based on SCr of 1.41 mg/dL (H)).  Liver Function Tests: Recent Labs  Lab 06/24/21 2321 06/27/21 0247  AST 19 28  ALT 21 22  ALKPHOS 49 48  BILITOT 1.0 1.2  PROT 6.6 6.3*  ALBUMIN 3.6 3.0*   CBG: Recent Labs  Lab 06/23/21 1605 06/25/21 1127 06/25/21 1523 06/27/21 0226  GLUCAP 144* 147* 156* 174*    Recent Results (from the past 240 hour(s))  MRSA Next Gen by PCR, Nasal     Status: None   Collection Time: 06/22/21  1:02 AM   Specimen: Nasal Mucosa; Nasal Swab  Result Value Ref Range Status   MRSA by PCR Next Gen NOT DETECTED NOT DETECTED Final    Comment: (NOTE) The GeneXpert MRSA Assay (FDA approved for NASAL specimens only), is one component of a comprehensive MRSA colonization surveillance program. It is not intended to diagnose MRSA infection nor to guide or monitor treatment for MRSA infections. Test performance is not FDA approved in patients less than 68 years old. Performed at [facility], [address] [place], [place] [address]   Culture, blood (Routine X 2) w Reflex to ID Panel     Status: None (Preliminary result)   Collection Time:  06/27/21  3:31 AM   Specimen: Right Antecubital; Blood  Result Value Ref Range Status   Specimen Description   Final    RIGHT ANTECUBITAL BOTTLES DRAWN AEROBIC AND ANAEROBIC   Special Requests   Final    Blood Culture adequate volume Performed at [facility], [address] [place], [place] [address]    Culture PENDING  Incomplete   Report Status PENDING  Incomplete  Culture, blood (Routine X 2) w Reflex to ID Panel     Status: None (Preliminary result)   Collection Time: 06/27/21  3:38 AM   Specimen: BLOOD RIGHT HAND  Result Value Ref Range Status   Specimen Description   Final    BLOOD RIGHT HAND BOTTLES DRAWN AEROBIC AND ANAEROBIC   Special Requests   Final    Blood Culture adequate volume Performed at [facility], [address] [place], [place] [address]    Culture PENDING  Incomplete   Report Status PENDING  Incomplete     Radiology Studies: DG Chest Port 1 View  Result Date: 06/27/2021 CLINICAL DATA:  Shortness of breath. Hypertension. COPD. EXAM: PORTABLE CHEST 1 VIEW COMPARISON:  One-view chest x-ray 06/27/2021 FINDINGS: Heart size is normal. Atherosclerotic calcifications are present at the aortic arch. Bibasilar opacities likely reflect atelectasis, not significantly changed. Chronic interstitial coarsening again noted. Left upper lobe pulmonary nodule again seen. Recommend CT chest without contrast to assure stability following previous recommendation for follow-up chest CT in 2020. IMPRESSION: 1. Stable bibasilar atelectasis. 2. Left upper lobe pulmonary nodule. Recommend follow-up CT chest without contrast to assure stability. A follow-up chest CT was recommended on a lung cancer screening CT in September 2020. Electronically Signed   By: San Morelle M.D.   On: 06/27/2021 07:55   DG Chest Port 1 View  Result Date: 06/27/2021 CLINICAL DATA:  Hypoxia. EXAM: PORTABLE CHEST 1 VIEW COMPARISON:  Chest x-ray 06/24/2021.  CT of the chest 10/29/2018. FINDINGS:  Emphysematous changes are again seen. There are interstitial and patchy airspace opacities in the lower lungs, right greater than left. These have decreased on the left when compared to the prior study. Left upper lobe nodule is again seen, not well characterized on this study. Cardiomediastinal silhouette is stable. No acute fractures are seen. IMPRESSION: 1. Again seen is bibasilar airspace disease, slightly decreasing on the left. 2. Emphysema. 3. Left upper lobe nodule again seen as noted on prior CT from 2020. This can be followed up with chest CT if clinically warranted. Electronically Signed   By: Ronney Asters M.D.   On: 06/27/2021 03:08    Scheduled Meds:  adenosine (ADENOCARD) IV  6 mg Intravenous Once   apixaban  5 mg Oral BID   arformoterol  15 mcg Nebulization BID   budesonide (PULMICORT) nebulizer solution  0.5 mg Nebulization BID   chlorhexidine  15 mL Mouth Rinse BID  Chlorhexidine Gluconate Cloth  6 each Topical Daily   dextromethorphan-guaiFENesin  1 tablet Oral BID   diltiazem  240 mg Oral Daily   furosemide  40 mg Intravenous Q12H   levalbuterol  1.25 mg Nebulization TID   mouth rinse  15 mL Mouth Rinse q12n4p   methylPREDNISolone (SOLU-MEDROL) injection  40 mg Intravenous Q12H   metoprolol tartrate  25 mg Oral BID   pantoprazole  40 mg Oral Daily   revefenacin  175 mcg Nebulization Daily   tamsulosin  0.4 mg Oral QPC supper   Continuous Infusions:   LOS: 8 days    Barton Dubois, MD [facility]  If 7PM-7AM, please contact night-coverage [email or web address] 06/27/2021, 1:48 PM

## 2021-06-27 NOTE — Progress Notes (Signed)
0500 ABG obtained and hand delivered to lab.  Increased patient's FIO2 to 60% from 50% due to sats of 87% while this RT was in room.

## 2021-06-27 NOTE — Progress Notes (Signed)
Rapid response called for unresponsiveness. Patient had become unresponsive and hypoxic down to 82%. His mentation had been baseline for the night leading up to this. He was being bagged when I arrived at bedside. Oxygen sats mid 90s, BP 120s, blood glucose was 174. A febrile. EKG was without acute ST changes. HR 90s. CMP, CBC, trop, mag, procal (with uptrending WBCs - although likely explained with steroid regimen). D-Dimer was also ordered, PE low on the differential after noting that patient is on Eliquis. Blood cultures drawn, UA ordered. ABG comes back with elevated PCO2. Patient placed on BiPAP 18/10. pH 7.24. CXR appears volume overloaded, but official read is pending. Will continue to monitor.   CRITICAL CARE Performed by: [place]   Total critical care time: 35 minutes  Critical care time was exclusive of separately billable procedures and treating other patients.  Critical care was necessary to treat or prevent imminent or life-threatening deterioration.  Critical care was time spent personally by me on the following activities: development of treatment plan with patient and/or surrogate as well as nursing, discussions with consultants, evaluation of patient's response to treatment, examination of patient, obtaining history from patient or surrogate, ordering and performing treatments and interventions, ordering and review of laboratory studies, ordering and review of radiographic studies, pulse oximetry and re-evaluation of patient's condition.

## 2021-06-27 NOTE — Progress Notes (Signed)
Latest Reference Range & Units 06/27/21 13:54  FIO2 % 90  pH, Arterial 7.35 - 7.45  7.32 (L)  pCO2 arterial 32 - 48 mmHg 89 (HH)  pO2, Arterial 83 - 108 mmHg 142 (H)  Acid-Base Excess 0.0 - 2.0 mmol/L 15.7 (H)  Bicarbonate 20.0 - 28.0 mmol/L 46.4 (H)  O2 Saturation % 99.3  Patient temperature  36.7  Collection site  LEFT RADIAL  Allens test (pass/fail) PASS  PASS    Critical ABG pCO2 result called to RN by lab.    pCO2 trending down but still elevated above normal. MD [place] notified of ABG results.  Per MD Gwenlyn Perking, continue BiPap.

## 2021-06-27 NOTE — Progress Notes (Signed)
The Fio2 was in creased on BIPAP due to a decrease in saturation. The patient is tolerating BIPAP at this time with Fio2 @ 90% . RT will continue to monitor

## 2021-06-27 NOTE — Progress Notes (Signed)
Patient alert and asked RN what happened and how long he had "been out".   RN explained to patient the overnight events.  Patient asked why this occurred, patient and RN discussed his chronic condition of COPD, his refusal of his scheduled breathing treatments, and his refusal to perform his incentive spirometer and flutter valve as possible contributing factors.    Patient stated "I just don't feel like it" in response to why he wasn't performing his incentive spirometer and flutter valve.  RN broached the topic of patient's goals of care.  Patient endorsed wanting to be comfort, RN explained comfort care meant making him comfortable and letting nature take its course which would likely lead to passing away and he stated "yes, this long term stuff isn't working".    Patient oriented x4 and asking appropriate questions however CO2 level still elevated on previous arterial blood gas.  Advised patient that this discussion would need to be discussed with MD and MD would need to assess patient's mental status and ability to make this decision which may need to wait until normalized CO2 levels.  Patient advised he understood.   Notified MD Gwenlyn Perking of conversation.

## 2021-06-27 NOTE — Progress Notes (Signed)
[id number], pt began to desat as low as 77%. When checking pt he was not responsive to sternal rub or any other stimuli. Charge RN, [place], began bagging pt. Crash cart brought to bedside. Dr. Clearence Ped notified. RT notified. Pt's brother, Ronalee Belts, was called and updated.   Provider, RT x2, nurse supervisor, charge RN at bedside.   EKG, CXR, labs, ABG, Bipap, 40mg  lasix and zofran ordered and completed.   Family came to bedside. Brother, Ronalee Belts, and his wife were updated. Brother expressed desire to make pt DNR and comfortable as possible. Provider notified of his request to change pt's code status to DNR. No current complaints from family and pt currently in no distress.

## 2021-06-27 NOTE — Progress Notes (Signed)
Patient called RN to room and advised feeling the urge to void and fullness but unable to empty bladder.  Bladder scan showed 613 mL of urine.   Notified MD Gwenlyn Perking.  New orders received.    Patient able to void a small amount after initial bladder scan, re-scanned and 530 mL of urine in bladder.  In and out catheter done and 550 mL of urine returned.

## 2021-06-28 DIAGNOSIS — I471 Supraventricular tachycardia: Secondary | ICD-10-CM | POA: Diagnosis not present

## 2021-06-28 DIAGNOSIS — I480 Paroxysmal atrial fibrillation: Secondary | ICD-10-CM | POA: Diagnosis not present

## 2021-06-28 DIAGNOSIS — Z7189 Other specified counseling: Secondary | ICD-10-CM | POA: Diagnosis not present

## 2021-06-28 DIAGNOSIS — J441 Chronic obstructive pulmonary disease with (acute) exacerbation: Secondary | ICD-10-CM | POA: Diagnosis not present

## 2021-06-28 DIAGNOSIS — J9621 Acute and chronic respiratory failure with hypoxia: Secondary | ICD-10-CM | POA: Diagnosis not present

## 2021-06-28 LAB — BLOOD GAS, ARTERIAL
Acid-Base Excess: 19.6 mmol/L — ABNORMAL HIGH (ref 0.0–2.0)
Bicarbonate: 46.9 mmol/L — ABNORMAL HIGH (ref 20.0–28.0)
Drawn by: 27733
FIO2: 60 %
O2 Saturation: 98.7 %
Patient temperature: 36.9
pCO2 arterial: 66 mmHg (ref 32–48)
pH, Arterial: 7.46 — ABNORMAL HIGH (ref 7.35–7.45)
pO2, Arterial: 91 mmHg (ref 83–108)

## 2021-06-28 MED ORDER — GLYCOPYRROLATE 0.2 MG/ML IJ SOLN: 0.2000 mg | INTRAMUSCULAR | Status: DC | PRN

## 2021-06-28 MED ORDER — MORPHINE SULFATE (PF) 2 MG/ML IV SOLN
2.0000 mg | INTRAVENOUS | Status: DC | PRN
  Administered 2021-06-28 – 2021-06-29 (×5): 2 mg via INTRAVENOUS
  Filled 2021-06-28 (×4): qty 1

## 2021-06-28 MED ORDER — LORAZEPAM 2 MG/ML PO CONC: 1.0000 mg | ORAL | Status: DC | PRN

## 2021-06-28 MED ORDER — BIOTENE DRY MOUTH MT LIQD: 15.0000 mL | OROMUCOSAL | Status: DC | PRN

## 2021-06-28 MED ORDER — GLYCOPYRROLATE 0.2 MG/ML IJ SOLN
0.2000 mg | INTRAMUSCULAR | Status: DC | PRN
  Administered 2021-06-29: 0.2 mg via INTRAVENOUS
  Filled 2021-06-28: qty 1

## 2021-06-28 MED ORDER — POLYVINYL ALCOHOL 1.4 % OP SOLN: 1.0000 [drp] | Freq: Four times a day (QID) | OPHTHALMIC | Status: DC | PRN

## 2021-06-28 MED ORDER — ONDANSETRON HCL 4 MG/2ML IJ SOLN: 4.0000 mg | Freq: Four times a day (QID) | INTRAMUSCULAR | Status: DC | PRN

## 2021-06-28 MED ORDER — GLYCOPYRROLATE 1 MG PO TABS: 1.0000 mg | ORAL_TABLET | ORAL | Status: DC | PRN

## 2021-06-28 MED ORDER — POLYETHYLENE GLYCOL 3350 17 G PO PACK
17.0000 g | PACK | Freq: Every day | ORAL | Status: DC | PRN
Start: 2021-06-28 — End: 2021-06-29

## 2021-06-28 MED ORDER — HALOPERIDOL 0.5 MG PO TABS: 0.5000 mg | ORAL_TABLET | ORAL | Status: DC | PRN

## 2021-06-28 MED ORDER — ALUM & MAG HYDROXIDE-SIMETH 200-200-20 MG/5ML PO SUSP
30.0000 mL | ORAL | Status: DC | PRN
  Administered 2021-06-28: 30 mL via ORAL
  Filled 2021-06-28: qty 30

## 2021-06-28 MED ORDER — HALOPERIDOL LACTATE 2 MG/ML PO CONC: 0.5000 mg | ORAL | Status: DC | PRN

## 2021-06-28 MED ORDER — ONDANSETRON 4 MG PO TBDP: 4.0000 mg | ORAL_TABLET | Freq: Four times a day (QID) | ORAL | Status: DC | PRN

## 2021-06-28 MED ORDER — FUROSEMIDE 40 MG PO TABS
40.0000 mg | ORAL_TABLET | Freq: Every day | ORAL | Status: DC
  Administered 2021-06-28 – 2021-06-29 (×2): 40 mg via ORAL
  Filled 2021-06-28 (×2): qty 1

## 2021-06-28 MED ORDER — BISACODYL 10 MG RE SUPP
10.0000 mg | Freq: Every day | RECTAL | Status: DC | PRN
Start: 2021-06-28 — End: 2021-06-29

## 2021-06-28 MED ORDER — HYDROCOD POLI-CHLORPHE POLI ER 10-8 MG/5ML PO SUER
5.0000 mL | Freq: Two times a day (BID) | ORAL | Status: DC
  Administered 2021-06-28 – 2021-06-29 (×3): 5 mL via ORAL
  Filled 2021-06-28 (×3): qty 5

## 2021-06-28 MED ORDER — LORAZEPAM 1 MG PO TABS: 1.0000 mg | ORAL_TABLET | ORAL | Status: DC | PRN

## 2021-06-28 MED ORDER — HALOPERIDOL LACTATE 5 MG/ML IJ SOLN: 0.5000 mg | INTRAMUSCULAR | Status: DC | PRN

## 2021-06-28 MED ORDER — LORAZEPAM 2 MG/ML IJ SOLN
1.0000 mg | INTRAMUSCULAR | Status: DC | PRN
  Administered 2021-06-29: 1 mg via INTRAVENOUS
  Filled 2021-06-28: qty 1

## 2021-06-28 NOTE — Consult Note (Addendum)
Consultation Note Date: 06/28/2021   Patient Name: Jacob Pace  DOB: [birth year]  MRN: [id number]  Age / Sex: 71 y.o., male  PCP: [facility] Referring Physician: Barton Dubois, MD  Reason for Consultation:  goals of care  HPI/Patient Profile: 71 y.o. male  with past medical history of HTN, emphysematous COPD with very severe obstruction and chronic respiratory failure on home oxygen 4L, admitted 4/2-4/4 with exacerbation requiring bipap,  admitted on 06/18/2021 with SOB. Found to be in SVT requiring adenosine, started on cardizem, COPD exacerbation- started on bipap. A-fib with RVR- started on amiodarone temporarily. He was slowly improving, but remained SOB throughout admission. 5/26 had recurrence of SVT required adenosine again. ABG's have shown hypercarbia and hypoxia. He was declining breathing treatments and IS, also declining to wear bipap. On 5/28 he had an episode of unresponsiveness and oxygen desaturation. Bipap restarted. He is having urinary retention.  Palliative medicine consulted for goals of care.    Primary Decision Maker PATIENT - with support of his brother and sister  Discussion: I have reviewed medical records including Care Everywhere, progress notes from this and prior admissions, labs and imaging, discussed with RN.  On evaluation patient is awake, alert, oriented to person, place and situation.   His brother, Ronalee Belts is at bedside- patient is agreeable to Ronalee Belts being involved in discussion.   I introduced Palliative Medicine as specialized medical care for people living with serious illness. It focuses on providing relief from the symptoms and stress of a serious illness. The goal is to improve quality of life for both the patient and the family.  As far as functional and nutritional status - Jacob Pace is not eating or drinking. He has no energy to even try and eat. He gets  very short of breath with any movement. He has had noted worsening of his functional status over the last several months. He does not feel he has good quality of life.    We discussed patient's current illness and what it means in the larger context of patient's on-going co-morbidities.  Natural disease trajectory and expectations at EOL were discussed.  The difference between aggressive medical intervention and comfort care was considered in light of the patient's goals of care.   Advance directives, concepts specific to code status, artificial feeding and hydration, and rehospitalization were considered and discussed.  Jacob Pace clearly expressed that he did not want to continue aggressive medical care and his desire was for hospice and comfort focused care only. We discussed this meant stopping labs, IV fluids, bipap, maintenance medications- but providing symptom relief and allowing for natural dying process. Jacob Pace clearly stated this was his wish.   We discussed possible hospice facility, home with hospice, hospital death- Jacob Pace stated he didn't care where he was, he just did not want to suffer.   I shared my concern he will deteriorate rather quickly- recommended transition to comfort measures in hospital today and see how he does overnight- then discuss possible d/c to hospice house.  Questions and concerns were addressed.     SUMMARY OF RECOMMENDATIONS -Transition to full comfort measures only -Morphine 2mg  IV q15 min prn- will start infusion if frequent pushes are needed -Tussionex po q12 hr -lorazepam 1mg  po or IV for anxiety and sleep -robinul  0.2mg  q4 hr prn for terminal secretions -haldol 0.5mg  IV or po q4hr prn for terminal delirium -foley cath for urinary retention/comfort measures    Code Status/Advance Care Planning: DNR   Prognosis:   < 2 weeks  Discharge Planning: To Be Determined  Primary Diagnoses: Present on Admission:  COPD exacerbation ([place])  Acute on  chronic respiratory failure with hypoxia (HCC)  Leukocytosis  Essential (primary) hypertension  COPD with acute exacerbation (HCC)   Review of Systems  Constitutional:  Positive for activity change and appetite change.  Respiratory:  Positive for shortness of breath.   Psychiatric/Behavioral:  Positive for sleep disturbance.    Physical Exam Vitals and nursing note reviewed.  Constitutional:      Comments: Appears uncomfortable  Pulmonary:     Comments: Increased rate and effort Neurological:     Mental Status: He is oriented to person, place, and time.  Psychiatric:        Thought Content: Thought content normal.        Judgment: Judgment normal.    Vital Signs: BP 134/71   Pulse 84   Temp 98.5 F (36.9 C) (Oral)   Resp 18   Ht 6\' 2"  (1.88 m)   Wt 97.6 kg   SpO2 95%   BMI 27.63 kg/m  Pain Scale: 0-10 POSS *See Group Information*: S-Acceptable,Sleep, easy to arouse Pain Score: 0-No pain   SpO2: SpO2: 95 % O2 Device:SpO2: 95 % O2 Flow Rate: .O2 Flow Rate (L/min): 6 L/min  IO: Intake/output summary:  Intake/Output Summary (Last 24 hours) at 06/28/2021 1157 Last data filed at 06/28/2021 0800 Gross per 24 hour  Intake 600 ml  Output 1475 ml  Net -875 ml    LBM: Last BM Date : 06/26/21 Baseline Weight: Weight: 103.1 kg Most recent weight: Weight: 97.6 kg       Thank you for this consult. Palliative medicine will continue to follow and assist as needed.   Greater than 50%  of this time was spent counseling and coordinating care related to the above assessment and plan.  Signed by: Mariana Kaufman, AGNP-C Palliative Medicine    Please contact Palliative Medicine Team phone at [phone] for questions and concerns.  For individual provider: See Shea Evans

## 2021-06-28 NOTE — Progress Notes (Signed)
Patient taken off BIPAP briefly for a water break. Patient is alert and oriented to time and place and was able to tell me and RN his birthdate correctly. Patient desatted to 88% on 5 lpm nasal cannula while off machine. Placed back on BIPAP after water break. Assured patient he should be able to come off in the morning as long as his mentation stays as it is.

## 2021-06-28 NOTE — Progress Notes (Addendum)
Patient requesting BiPap to remain off and for patient to be on nasal cannula oxygen and to be provided with water.   RN placed patient on 5 L [place] and provided patient with sips of water.   Patient's oxygen saturation level 84-86%.   Patient alert and oriented x4.   Messaged MD Dyann Kief to inform of patient status and patient's request to remain off BiPap  New orders received from MD [facility]     [place] rounding on patient at bedside.   Patient's oxygen saturations ranging from 82-90% on 6 L Nasal cannula.   Patient is alert and oriented x4.   MD Dyann Kief advised if patient's mentation remains alert and oriented with oxygen saturations ranging 82-90% this is acceptable and RN to continue to monitor patient as there is concern with raising patient's oxygen requirements which could contribute to rising CO2 levels.     MD Dyann Kief encouraged patient to perform his incentive spirometer and flutter valve, patient advised MD that he "just didn't feel like it".   Patient previously advised RN that he didn't have the energy to perform incentive spirometer and/or flutter valve.

## 2021-06-28 NOTE — Progress Notes (Signed)
Patient prefers to not have foley catheter inserted at this time, if needed he would rather have the in and out catheter done. He is still urinating some on his own, bladder scan reading 305 ml at this time. Patient is aware he can change his mind.

## 2021-06-28 NOTE — Progress Notes (Signed)
PROGRESS NOTE    Jacob Pace  WGN:[id number] DOB: 08-26-1950 DOA: 06/18/2021 [facility]CP: [facility]   Brief Narrative:    Jacob Pace is a 71 y.o. male with medical history significant of essential hypertension, chronic respiratory failure on supplemental oxygen at 4 LPM and COPD who presents to the emergency department via EMS due to worsening shortness of breath which started last night.  He was admitted for acute on chronic hypoxemic respiratory failure secondary to COPD exacerbation.  He was showing signs of improvement, but overnight on 5/22 he went into new onset atrial fibrillation with RVR and was started on amiodarone infusion.  We will transfer to telemetry bed.  A-fib now controlled with adjusted dose of Cardizem and not requiring amiodarone.  Continue treatment for COPD exacerbation.  Assessment & Plan:   Principal Problem:   COPD exacerbation (HCC) Active Problems:   Acute on chronic respiratory failure with hypoxia (HCC)   Essential (primary) hypertension   Leukocytosis   Hyperglycemia   COPD with acute exacerbation (HCC)  Assessment and Plan:  Acute on chronic respiratory failure with hypoxia and hypercapnia secondary to COPD exacerbation -Steroids and bronchodilator management has been discontinued this morning.  Plan is for symptomatic management only.  Plan is for comfort care only. -Continue oxygen supplementation and the use of morphine for air hunger. -Continue the use of mucolytic's and chest physiotherapy. -ABG demonstrating a CO2 down to 66; patient mentation has improved and is able to follow commands and express his needs.  He is oriented x3. -Goals of care discussion regarding decision for comfort care and symptomatic management only -No further BiPAP.  Patient is DNR/DNI.  Supraventricular tachycardia/paroxysmal atrial fibrillation -Adenosine used on admission and another dose provided on 5/25 and 5/26 at nighttime after experiencing  uncontrolled episode of SVT. -Telemetry has been discontinued; normal Cardizem and Eliquis. -Continue to follow cardiology service recommendations. -Transitioning to comfort care and symptomatic management after discussion of goals of care with palliative care. -Continue the use of metoprolol.  New onset atrial fibrillation with RVR -Cardizem and Eliquis has been discontinued; plan for comfort care and symptomatic management only. -Continue the use of metoprolol. -2D echo demonstrating no wall motion abnormalities and preserved ejection fraction.   -TSH within normal limits.   Leukocytosis appears to be reactive -Currently receiving steroids -Patient is afebrile -Continue to follow WBCs trend/stability. -Continue holding on antibiotics at this point.   -Focusing on comfort care only. -Blood draws.  Essential hypertension (controlled) -Goals of care discussion Cardizem has been discontinued -The use of Lasix and metoprolol only. -Follow vital signs.  Interstitial edema/acute diastolic heart failure -Most likely triggered by arrhythmia -Will continue treatment with IV Lasix, continue to follow daily weights/strict I's and O's. -Repeat chest x-ray demonstrating vascular congestion and chronic emphysematous changes.  No infiltrates. -After goals of care discussion and decision on timing intubations, we will focus on comfort care and symptomatic management only. -Medication helping tailor to assist his symptoms.  Lasix and metoprolol has been [place] controlling volume and rate.  Anticoagulation of Cardizem has been discontinued.  Overweight -Body mass index is 27.63 kg/m. -Low calorie diet, portion control and increase physical activity discussed with patient.   DVT prophylaxis: Eliquis Code Status: DNI/DNR Family Communication: Wife and brother at bedside. Disposition Plan:  Status is: Inpatient  Remains inpatient appropriate because: Need for ongoing breathing treatments and  IV steroids.  Now transitioning into comfort care and symptomatic management only.     Consultants:  Cardiology   Procedures:  See below   Antimicrobials:  Anti-infectives (From admission, onward)    Start     Dose/Rate Route Frequency Ordered Stop   06/19/21 1000  azithromycin (ZITHROMAX) tablet 250 mg       See Hyperspace for full Linked Orders Report.   250 mg Oral Daily 06/18/21 2106 06/22/21 0917   06/18/21 2115  azithromycin (ZITHROMAX) tablet 500 mg       See Hyperspace for full Linked Orders Report.   500 mg Oral Daily 06/18/21 2106 06/18/21 2217       Subjective: No chest pain, no nausea, no vomiting, no fever.  Patient respiratory status has improved after the use of BiPAP for approximately 20 hours.  CO2 down to 66 and patient oriented x3, following commands and demonstrating good insight of his condition.  He expressed feeling tired and looking to be transitioning to comfort care and symptomatic management only.  Did not want any further use of BiPAP.  Objective: Vitals:   06/28/21 1026 06/28/21 1100 06/28/21 1115 06/28/21 1200  BP:  134/71  (!) 136/52  Pulse: 83 86 84 89  Resp: 20 (!) 23 18 19   Temp:   98.5 F (36.9 C)   TempSrc:   Oral   SpO2: 90% (!) 81% 95% 96%  Weight:      Height:        Intake/Output Summary (Last 24 hours) at 06/28/2021 1736 Last data filed at 06/28/2021 1500 Gross per 24 hour  Intake 480 ml  Output 650 ml  Net -170 ml    Filed Weights   06/22/21 0049 06/22/21 0538 06/23/21 0400  Weight: 96.3 kg 97.6 kg 97.6 kg    Examination: General exam: Alert, awake, oriented x 3; having intermittent coughing spells and with positive happy hypoxia after discontinuation of BiPAP.  Patient is afebrile.  Experiencing orthopnea and short winded sensation. Respiratory system: Decreased breath sounds at the bases, positive rhonchi bilaterally; fair air movement.  Positive expiratory wheezing.  Patient is tachypneic with minimal  exertion. Cardiovascular system: Rate controlled, no rubs, no gallops, no JVD. Gastrointestinal system: Abdomen is obese, nondistended, soft and nontender. No organomegaly or masses felt. Normal bowel sounds heard. Central nervous system: Alert and oriented. No focal neurological deficits.  Moving 4 limbs spontaneously. Extremities: No cyanosis or clubbing. Skin: No petechiae. Psychiatry: Judgement and insight appear normal.  Flat affect appreciated on exam.  Data Reviewed: I have personally reviewed following labs and imaging studies  CBC: Recent Labs  Lab 06/22/21 0415 06/23/21 0358 06/26/21 0407 06/27/21 0247  WBC 15.7* 13.0* 25.5* 19.8*  HGB 12.7* 12.4* 12.9* 12.7*  HCT 41.6 41.0 42.0 42.7  MCV 93.9 93.6 94.0 95.7  PLT 262 241 204 219   Basic Metabolic Panel: Recent Labs  Lab 06/22/21 0415 06/23/21 0358 06/24/21 2321 06/25/21 2232 06/26/21 0407 06/27/21 0247  NA 139 139 137 136 137 138  K 3.8 4.1 4.1 3.9 4.4 4.3  CL 99 97* 94* 88* 91* 90*  CO2 32 35* 31 30 35* 36*  GLUCOSE 170* 166* 180* 190* 174* 175*  BUN 28* 23 21 34* 42* 52*  CREATININE 0.96 0.96 0.95 1.48* 1.58* 1.41*  CALCIUM 8.7* 8.5* 8.9 8.8* 8.5* 8.7*  MG 2.5* 2.4 2.6* 2.3  --  2.6*   GFR: Estimated Creatinine Clearance: 55.1 mL/min (A) (by C-G formula based on SCr of 1.41 mg/dL (H)).  Liver Function Tests: Recent Labs  Lab 06/24/21 2321 06/27/21 0247  AST 19 28  ALT 21 22  ALKPHOS 49 48  BILITOT 1.0 1.2  PROT 6.6 6.3*  ALBUMIN 3.6 3.0*   CBG: Recent Labs  Lab 06/23/21 1605 06/25/21 1127 06/25/21 1523 06/27/21 0226  GLUCAP 144* 147* 156* 174*    Recent Results (from the past 240 hour(s))  MRSA Next Gen by PCR, Nasal     Status: None   Collection Time: 06/22/21  1:02 AM   Specimen: Nasal Mucosa; Nasal Swab  Result Value Ref Range Status   MRSA by PCR Next Gen NOT DETECTED NOT DETECTED Final    Comment: (NOTE) The GeneXpert MRSA Assay (FDA approved for NASAL specimens only), is one  component of a comprehensive MRSA colonization surveillance program. It is not intended to diagnose MRSA infection nor to guide or monitor treatment for MRSA infections. Test performance is not FDA approved in patients less than 72 years old. Performed at [facility], [address] [place], [place] [address]   Culture, blood (Routine X 2) w Reflex to ID Panel     Status: None (Preliminary result)   Collection Time: 06/27/21  3:31 AM   Specimen: Right Antecubital; Blood  Result Value Ref Range Status   Specimen Description   Final    RIGHT ANTECUBITAL BOTTLES DRAWN AEROBIC AND ANAEROBIC   Special Requests Blood Culture adequate volume  Final   Culture   Final    NO GROWTH < 24 HOURS Performed at [facility], [address] [place], [place] [address]    Report Status PENDING  Incomplete  Culture, blood (Routine X 2) w Reflex to ID Panel     Status: None (Preliminary result)   Collection Time: 06/27/21  3:38 AM   Specimen: BLOOD RIGHT HAND  Result Value Ref Range Status   Specimen Description   Final    BLOOD RIGHT HAND BOTTLES DRAWN AEROBIC AND ANAEROBIC   Special Requests Blood Culture adequate volume  Final   Culture   Final    NO GROWTH < 24 HOURS Performed at [facility], [address] [place], [place] [address]    Report Status PENDING  Incomplete     Radiology Studies: DG Chest Port 1 View  Result Date: 06/27/2021 CLINICAL DATA:  Shortness of breath. Hypertension. COPD. EXAM: PORTABLE CHEST 1 VIEW COMPARISON:  One-view chest x-ray 06/27/2021 FINDINGS: Heart size is normal. Atherosclerotic calcifications are present at the aortic arch. Bibasilar opacities likely reflect atelectasis, not significantly changed. Chronic interstitial coarsening again noted. Left upper lobe pulmonary nodule again seen. Recommend CT chest without contrast to assure stability following previous recommendation for follow-up chest CT in 2020. IMPRESSION: 1. Stable bibasilar atelectasis. 2. Left  upper lobe pulmonary nodule. Recommend follow-up CT chest without contrast to assure stability. A follow-up chest CT was recommended on a lung cancer screening CT in September 2020. Electronically Signed   By: Marin Robertshristopher  Mattern M.D.   On: 06/27/2021 07:55   DG Chest Port 1 View  Result Date: 06/27/2021 CLINICAL DATA:  Hypoxia. EXAM: PORTABLE CHEST 1 VIEW COMPARISON:  Chest x-ray 06/24/2021.  CT of the chest 10/29/2018. FINDINGS: Emphysematous changes are again seen. There are interstitial and patchy airspace opacities in the lower lungs, right greater than left. These have decreased on the left when compared to the prior study. Left upper lobe nodule is again seen, not well characterized on this study. Cardiomediastinal silhouette is stable. No acute fractures are seen. IMPRESSION: 1. Again seen is bibasilar airspace disease, slightly decreasing on the left. 2. Emphysema. 3. Left  upper lobe nodule again seen as noted on prior CT from 2020. This can be followed up with chest CT if clinically warranted. Electronically Signed   By: Darliss Cheney M.D.   On: 06/27/2021 03:08    Scheduled Meds:  chlorpheniramine-HYDROcodone  5 mL Oral Q12H   furosemide  40 mg Oral Daily   mouth rinse  15 mL Mouth Rinse q12n4p   metoprolol tartrate  25 mg Oral BID   tamsulosin  0.4 mg Oral QPC supper   Continuous Infusions:   LOS: 9 days    Vassie Loll, MD [facility]  If 7PM-7AM, please contact night-coverage [email or web address] 06/28/2021, 5:36 PM

## 2021-06-28 NOTE — Progress Notes (Signed)
Date and time results received: 06/28/21 0844 (use smartphrase ".now" to insert current time)  Test: ABG Critical Value: PCO2 of 66  Name of Provider Notified: Dr Gwenlyn Perking  Orders Received? Or Actions Taken?: No new orders currently

## 2021-06-29 ENCOUNTER — Inpatient Hospital Stay (HOSPITAL_COMMUNITY)
Admission: RE | Admit: 2021-06-29 | Discharge: 2021-07-01 | Disposition: E | Payer: No Typology Code available for payment source | Source: Ambulatory Visit | Attending: Internal Medicine | Admitting: Internal Medicine

## 2021-06-29 DIAGNOSIS — Z7189 Other specified counseling: Secondary | ICD-10-CM

## 2021-06-29 DIAGNOSIS — J441 Chronic obstructive pulmonary disease with (acute) exacerbation: Secondary | ICD-10-CM | POA: Diagnosis not present

## 2021-06-29 DIAGNOSIS — J438 Other emphysema: Secondary | ICD-10-CM | POA: Diagnosis present

## 2021-06-29 DIAGNOSIS — N179 Acute kidney failure, unspecified: Secondary | ICD-10-CM

## 2021-06-29 DIAGNOSIS — Z515 Encounter for palliative care: Secondary | ICD-10-CM | POA: Diagnosis not present

## 2021-06-29 DIAGNOSIS — J9621 Acute and chronic respiratory failure with hypoxia: Principal | ICD-10-CM

## 2021-06-29 DIAGNOSIS — I471 Supraventricular tachycardia, unspecified: Secondary | ICD-10-CM | POA: Diagnosis present

## 2021-06-29 DIAGNOSIS — I10 Essential (primary) hypertension: Secondary | ICD-10-CM | POA: Diagnosis not present

## 2021-06-29 DIAGNOSIS — I480 Paroxysmal atrial fibrillation: Secondary | ICD-10-CM | POA: Diagnosis present

## 2021-06-29 MED ORDER — POLYETHYLENE GLYCOL 3350 17 G PO PACK: 17.0000 g | PACK | Freq: Every day | ORAL | Status: DC | PRN

## 2021-06-29 MED ORDER — HYDROCOD POLI-CHLORPHE POLI ER 10-8 MG/5ML PO SUER: 5.0000 mL | Freq: Two times a day (BID) | ORAL | Status: DC

## 2021-06-29 MED ORDER — MORPHINE BOLUS VIA INFUSION
2.0000 mg | INTRAVENOUS | Status: DC | PRN
  Administered 2021-06-30: 2 mg via INTRAVENOUS

## 2021-06-29 MED ORDER — FUROSEMIDE 40 MG PO TABS: 40.0000 mg | ORAL_TABLET | Freq: Every day | ORAL | Status: DC

## 2021-06-29 MED ORDER — GLYCOPYRROLATE 1 MG PO TABS: 1.0000 mg | ORAL_TABLET | ORAL | Status: DC | PRN

## 2021-06-29 MED ORDER — MORPHINE 100MG IN NS 100ML (1MG/ML) PREMIX INFUSION
2.0000 mg/h | INTRAVENOUS | Status: DC
  Administered 2021-06-29: 2 mg/h via INTRAVENOUS
  Filled 2021-06-29: qty 100

## 2021-06-29 MED ORDER — ONDANSETRON 4 MG PO TBDP: 4.0000 mg | ORAL_TABLET | Freq: Four times a day (QID) | ORAL | Status: DC | PRN

## 2021-06-29 MED ORDER — ALUM & MAG HYDROXIDE-SIMETH 200-200-20 MG/5ML PO SUSP: 30.0000 mL | ORAL | Status: DC | PRN

## 2021-06-29 MED ORDER — MORPHINE 100MG IN NS 100ML (1MG/ML) PREMIX INFUSION
2.0000 mg/h | INTRAVENOUS | Status: DC
  Administered 2021-06-29: 2 mg/h via INTRAVENOUS

## 2021-06-29 MED ORDER — POLYVINYL ALCOHOL 1.4 % OP SOLN: 1.0000 [drp] | Freq: Four times a day (QID) | OPHTHALMIC | Status: DC | PRN

## 2021-06-29 MED ORDER — GLYCOPYRROLATE 0.2 MG/ML IJ SOLN: 0.2000 mg | INTRAMUSCULAR | Status: DC | PRN

## 2021-06-29 MED ORDER — BISACODYL 10 MG RE SUPP: 10.0000 mg | Freq: Every day | RECTAL | Status: DC | PRN

## 2021-06-29 MED ORDER — MORPHINE SULFATE (PF) 2 MG/ML IV SOLN: 2.0000 mg | INTRAVENOUS | Status: DC | PRN

## 2021-06-29 MED ORDER — BIOTENE DRY MOUTH MT LIQD: 15.0000 mL | OROMUCOSAL | Status: DC | PRN

## 2021-06-29 MED ORDER — HALOPERIDOL LACTATE 5 MG/ML IJ SOLN: 0.5000 mg | INTRAMUSCULAR | Status: DC | PRN

## 2021-06-29 MED ORDER — GLYCOPYRROLATE 0.2 MG/ML IJ SOLN
0.2000 mg | INTRAMUSCULAR | Status: DC
Start: 2021-06-29 — End: 2021-06-30
  Administered 2021-06-29 (×2): 0.2 mg via INTRAVENOUS
  Filled 2021-06-29: qty 1

## 2021-06-29 MED ORDER — ORAL CARE MOUTH RINSE: 15.0000 mL | Freq: Two times a day (BID) | OROMUCOSAL | Status: DC

## 2021-06-29 MED ORDER — LORAZEPAM 1 MG PO TABS: 1.0000 mg | ORAL_TABLET | ORAL | Status: DC | PRN

## 2021-06-29 MED ORDER — ONDANSETRON HCL 4 MG/2ML IJ SOLN
4.0000 mg | Freq: Four times a day (QID) | INTRAMUSCULAR | Status: DC | PRN
  Administered 2021-06-30: 4 mg via INTRAVENOUS
  Filled 2021-06-29: qty 2

## 2021-06-29 MED ORDER — ACETAMINOPHEN 325 MG PO TABS: 650.0000 mg | ORAL_TABLET | Freq: Four times a day (QID) | ORAL | Status: DC | PRN

## 2021-06-29 MED ORDER — GLYCOPYRROLATE 0.2 MG/ML IJ SOLN
0.2000 mg | INTRAMUSCULAR | Status: DC
  Administered 2021-06-29: 0.2 mg via INTRAVENOUS
  Filled 2021-06-29: qty 1

## 2021-06-29 MED ORDER — MORPHINE BOLUS VIA INFUSION: 2.0000 mg | INTRAVENOUS | Status: DC | PRN

## 2021-06-29 MED ORDER — LORAZEPAM 2 MG/ML IJ SOLN: 1.0000 mg | INTRAMUSCULAR | Status: DC | PRN

## 2021-06-29 MED ORDER — HALOPERIDOL 0.5 MG PO TABS: 0.5000 mg | ORAL_TABLET | ORAL | Status: DC | PRN

## 2021-06-29 MED ORDER — GLYCOPYRROLATE 0.2 MG/ML IJ SOLN
INTRAMUSCULAR | Status: AC
  Administered 2021-06-29: 0.2 mg
  Filled 2021-06-29: qty 1

## 2021-06-29 MED FILL — Medication: Qty: 1 | Status: AC

## 2021-06-29 NOTE — Progress Notes (Signed)
From chart review patient has transitioned to comfort care, cardiology will sign off inpatient care.   Dina Rich MD

## 2021-06-29 NOTE — Progress Notes (Addendum)
PROGRESS NOTE    Jacob Pace  HUT:[id number] DOB: Mar 22, 1950 DOA: 06/18/2021 [facility]: [facility]   Brief Narrative:    Jacob Pace is a 71 y.o. male with medical history significant of essential hypertension, chronic respiratory failure on supplemental oxygen at 4 LPM and COPD who presents to the emergency department via EMS due to worsening shortness of breath which started last night.  He was admitted for acute on chronic hypoxemic respiratory failure secondary to COPD exacerbation.  He was showing signs of improvement, but overnight on 5/22 he went into new onset atrial fibrillation with RVR and was started on amiodarone infusion.  We will transfer to telemetry bed.  A-fib now controlled with adjusted dose of Cardizem and not requiring amiodarone.  Continue treatment for COPD exacerbation.  Assessment & Plan:   Principal Problem:   COPD exacerbation (HCC) Active Problems:   Acute on chronic respiratory failure with hypoxia (HCC)   Essential (primary) hypertension   Leukocytosis   Hyperglycemia   COPD with acute exacerbation (HCC)   Palliative care by specialist   Goals of care, counseling/discussion   Terminal care  Assessment and Plan:  Acute on chronic respiratory failure with hypoxia and hypercapnia secondary to COPD exacerbation -Steroids and bronchodilator management has been discontinued this morning.  Plan is for symptomatic management only.  Plan is for comfort care only. -Continue oxygen supplementation and the use of morphine for air hunger. -Continue the use of mucolytic's and chest physiotherapy. -ABG demonstrating a CO2 down to 66; patient mentation has improved and is able to follow commands and express his needs.  He is oriented x3. -Goals of care discussion regarding decision for comfort care and symptomatic management only -No further BiPAP.  Patient is DNR/DNI. -Morphine drip will be initiated per palliative care  recommendations.  Supraventricular tachycardia/paroxysmal atrial fibrillation -Adenosine used on admission and another dose provided on 5/25 and 5/26 at nighttime after experiencing uncontrolled episode of SVT. -Telemetry has been discontinued; normal Cardizem and Eliquis. -Continue to follow cardiology service recommendations. -Transitioning to comfort care and symptomatic management after discussion of goals of care with palliative care. -Continue the use of metoprolol.  New onset atrial fibrillation with RVR -Cardizem and Eliquis has been discontinued; plan for comfort care and symptomatic management only. -Continue the use of metoprolol. -2D echo demonstrating no wall motion abnormalities and preserved ejection fraction.   -TSH within normal limits.   Leukocytosis appears to be reactive -Currently receiving steroids -Patient is afebrile -Continue to follow WBCs trend/stability. -Continue holding on antibiotics at this point.   -Focusing on comfort care only. -Blood draws.  Essential hypertension (controlled) -Goals of care discussion Cardizem has been discontinued -The use of Lasix and metoprolol only. -Follow vital signs.  Interstitial edema/acute diastolic heart failure -Most likely triggered by arrhythmia -Will continue treatment with IV Lasix, continue to follow daily weights/strict I's and O's. -Repeat chest x-ray demonstrating vascular congestion and chronic emphysematous changes.  No infiltrates. -After goals of care discussion and decision on timing intubations, will focus on comfort care and symptomatic management only. -Medication helping tailor to assist his symptoms.  Lasix and metoprolol has been continue to assist controlling volume and rate.  Anticoagulation of Cardizem has been discontinued.  Acute kidney injury -In the setting of decreased perfusion while using diuretics and having new onset urinary retention. -No further blood draws will be checked out down  as patient has been transition to full comfort care -Last creatinine with a level of 1.58>>1.41.  Overweight -Body mass index is 27.63 kg/m. -Low calorie diet, portion control and increase physical activity discussed with patient.   DVT prophylaxis: Eliquis Code Status: DNI/DNR Family Communication: Wife and brother at bedside. Disposition Plan:  Status is: Inpatient  Remains inpatient appropriate because: Need for ongoing breathing treatments and IV steroids.  Now transitioning into comfort care and symptomatic management only.     Consultants:  Cardiology   Procedures:  See below   Antimicrobials:  Anti-infectives (From admission, onward)    Start     Dose/Rate Route Frequency Ordered Stop   06/19/21 1000  azithromycin (ZITHROMAX) tablet 250 mg       See Hyperspace for full Linked Orders Report.   250 mg Oral Daily 06/18/21 2106 06/22/21 0917   06/18/21 2115  azithromycin (ZITHROMAX) tablet 500 mg       See Hyperspace for full Linked Orders Report.   500 mg Oral Daily 06/18/21 2106 06/18/21 2217       Subjective: No chest pain, no nausea, no vomiting.  Nurses reported urinary retention with refusal to do in and out cath or to place Foley at this point.  Appears to be in respiratory distress with worsening in his breathing.  Positive air hunger appreciated.  Objective: Vitals:   06/28/21 1200 06/28/21 2130 06/28/21 2200 06/13/2021 0511  BP: (!) 136/52 (!) 127/59  128/63  Pulse: 89 72  73  Resp: 19 16  13   Temp:  98.1 F (36.7 C)  97.9 F (36.6 C)  TempSrc:  Oral  Oral  SpO2: 96%  91% (!) 86%  Weight:      Height:        Intake/Output Summary (Last 24 hours) at 06/07/2021 1400 Last data filed at 06/12/2021 0600 Gross per 24 hour  Intake 120 ml  Output 650 ml  Net -530 ml    Filed Weights   06/22/21 0049 06/22/21 0538 06/23/21 0400  Weight: 96.3 kg 97.6 kg 97.6 kg    Examination: General exam: Experienced acute respiratory distress requiring high level  of oxygen supplementation; patient appears to be uncomfortable.  No fever, no chest pain, no nausea vomiting.  Currently afebrile. Respiratory system: Up to 9 L oxygen supplementation at some point through the night required.  Positive tachypnea and diffuse expiratory wheezing/rhonchi appreciated on exam. Cardiovascular system: Rate controlled, no rubs, no gallops Gastrointestinal system: Abdomen is obese nondistended, soft and nontender. No organomegaly or masses felt. Normal bowel sounds heard. Central nervous system: No focal neurologic deficits appreciated. Extremities: No cyanosis or clubbing. Skin: No petechiae. Psychiatry: Stable mood.  Data Reviewed: I have personally reviewed following labs and imaging studies  CBC: Recent Labs  Lab 06/23/21 0358 06/26/21 0407 06/27/21 0247  WBC 13.0* 25.5* 19.8*  HGB 12.4* 12.9* 12.7*  HCT 41.0 42.0 42.7  MCV 93.6 94.0 95.7  PLT 241 204 219   Basic Metabolic Panel: Recent Labs  Lab 06/23/21 0358 06/24/21 2321 06/25/21 2232 06/26/21 0407 06/27/21 0247  NA 139 137 136 137 138  K 4.1 4.1 3.9 4.4 4.3  CL 97* 94* 88* 91* 90*  CO2 35* 31 30 35* 36*  GLUCOSE 166* 180* 190* 174* 175*  BUN 23 21 34* 42* 52*  CREATININE 0.96 0.95 1.48* 1.58* 1.41*  CALCIUM 8.5* 8.9 8.8* 8.5* 8.7*  MG 2.4 2.6* 2.3  --  2.6*   GFR: Estimated Creatinine Clearance: 55.1 mL/min (A) (by C-G formula based on SCr of 1.41 mg/dL (H)).  Liver Function Tests: Recent Labs  Lab 06/24/21 2321 06/27/21 0247  AST 19 28  ALT 21 22  ALKPHOS 49 48  BILITOT 1.0 1.2  PROT 6.6 6.3*  ALBUMIN 3.6 3.0*   CBG: Recent Labs  Lab 06/23/21 1605 06/25/21 1127 06/25/21 1523 06/27/21 0226  GLUCAP 144* 147* 156* 174*    Recent Results (from the past 240 hour(s))  MRSA Next Gen by PCR, Nasal     Status: None   Collection Time: 06/22/21  1:02 AM   Specimen: Nasal Mucosa; Nasal Swab  Result Value Ref Range Status   MRSA by PCR Next Gen NOT DETECTED NOT DETECTED Final     Comment: (NOTE) The GeneXpert MRSA Assay (FDA approved for NASAL specimens only), is one component of a comprehensive MRSA colonization surveillance program. It is not intended to diagnose MRSA infection nor to guide or monitor treatment for MRSA infections. Test performance is not FDA approved in patients less than 25 years old. Performed at [facility], [address], [place], [place] [address]   Culture, blood (Routine X 2) w Reflex to ID Panel     Status: None (Preliminary result)   Collection Time: 06/27/21  3:31 AM   Specimen: Right Antecubital; Blood  Result Value Ref Range Status   Specimen Description   Final    RIGHT ANTECUBITAL BOTTLES DRAWN AEROBIC AND ANAEROBIC   Special Requests Blood Culture adequate volume  Final   Culture   Final    NO GROWTH 2 DAYS Performed at [facility], [address], [place], [place] [address]    Report Status PENDING  Incomplete  Culture, blood (Routine X 2) w Reflex to ID Panel     Status: None (Preliminary result)   Collection Time: 06/27/21  3:38 AM   Specimen: BLOOD RIGHT HAND  Result Value Ref Range Status   Specimen Description   Final    BLOOD RIGHT HAND BOTTLES DRAWN AEROBIC AND ANAEROBIC   Special Requests Blood Culture adequate volume  Final   Culture   Final    NO GROWTH 2 DAYS Performed at [facility], [address], [place], [place] [address]    Report Status PENDING  Incomplete     Radiology Studies: No results found.  Scheduled Meds:  chlorpheniramine-HYDROcodone  5 mL Oral Q12H   furosemide  40 mg Oral Daily   glycopyrrolate  0.2 mg Intravenous Q4H   mouth rinse  15 mL Mouth Rinse q12n4p   Continuous Infusions:  morphine 2 mg/hr (07/25/2021 1158)    LOS: 10 days    Vassie Loll, MD [facility]  If 7PM-7AM, please contact night-coverage [email or web address] 07/25/21, 2:00 PM

## 2021-06-29 NOTE — Discharge Summary (Signed)
Physician Discharge Summary   Patient: Jacob Pace MRN: [id number] DOB: 1950-08-08  Admit date:     06/18/2021  Discharge date: 06/17/2021  Discharge Physician: Barton Dubois   [facility]P: [facility]   Recommendations at discharge:  Comfort care and symptomatic management.  Discharge Diagnoses: Principal Problem:   COPD exacerbation ([place]) Active Problems:   Acute on chronic respiratory failure with hypoxia (H[facility])   Essential (primary) hypertension   Leukocytosis   Hyperglycemia   COPD with acute exacerbation ([place])   Palliative care by specialist   Goals of care, counseling/discussion   Terminal care Acute kidney injury.  Hospital Course: Jacob Pace is a 71 y.o. male with medical history significant of essential hypertension, chronic respiratory failure on supplemental oxygen at 4 LPM and COPD who presents to the emergency department via EMS due to worsening shortness of breath which started last night.  He was admitted for acute on chronic hypoxemic respiratory failure secondary to COPD exacerbation.  He was showing signs of improvement, but overnight on 5/22 he went into new onset atrial fibrillation with RVR and was started on amiodarone infusion.  We will transfer to telemetry bed.  A-fib now controlled with adjusted dose of Cardizem and not requiring amiodarone.  Continue treatment for COPD exacerbation.  Assessment and Plan: Acute on chronic respiratory failure with hypoxia and hypercapnia secondary to COPD exacerbation -Steroids and bronchodilator management has been discontinued this morning.  Plan is for symptomatic management only.  Plan is for comfort care only. -Continue oxygen supplementation and the use of morphine for air hunger. -Continue the use of mucolytic's and chest physiotherapy. -ABG demonstrating a CO2 down to 66; patient mentation has improved and is able to follow commands and express his needs.  He is oriented x3. -Goals of care discussion  regarding decision for comfort care and symptomatic management only -No further BiPAP.  Patient is DNR/DNI. -Morphine drip initiated per palliative care recommendations. -Life expectancy 2-4 weeks.   Supraventricular tachycardia/paroxysmal atrial fibrillation -Adenosine used on admission and another dose provided on 5/25 and 5/26 at nighttime after experiencing uncontrolled episode of SVT. -Telemetry has been discontinued; normal Cardizem and Eliquis. -Continue to follow cardiology service recommendations. -Transitioning to comfort care and symptomatic management after discussion of goals of care with palliative care. -Continue the use of metoprolol.   New onset atrial fibrillation with RVR -Cardizem and Eliquis has been discontinued; plan for comfort care and symptomatic management only. -Continue the use of metoprolol. -2D echo demonstrating no wall motion abnormalities and preserved ejection fraction.   -TSH within normal limits.   Leukocytosis appears to be reactive -Currently receiving steroids -Patient is afebrile -Continue to follow WBCs trend/stability. -Continue holding on antibiotics at this point.   -Focusing on comfort care only. -Blood draws.  Essential hypertension (controlled) -Goals of care discussion Cardizem has been discontinued -The use of Lasix and metoprolol only. -Follow vital signs.   Interstitial edema/acute diastolic heart failure -Most likely triggered by arrhythmia -Will continue treatment with IV Lasix, continue to follow daily weights/strict I's and O's. -Repeat chest x-ray demonstrating vascular congestion and chronic emphysematous changes.  No infiltrates. -After goals of care discussion and decision on timing intubations, will focus on comfort care and symptomatic management only. -Medication helping tailor to assist his symptoms.  Lasix and metoprolol has been continue to assist controlling volume and rate.  Anticoagulation of Cardizem has been  discontinued.   Acute kidney injury -In the setting of decreased perfusion while using diuretics and  having new onset urinary retention. -No further blood draws will be checked out down as patient has been transition to full comfort care -Last creatinine with a level of 1.58>>1.41.   Overweight -Body mass index is 27.63 kg/m. -Low calorie diet, portion control and increase physical activity discussed with patient.   Consultants: Cardiology service, palliative care, hospice. Procedures performed: See below for x-ray reports. Disposition: Hospice care Diet recommendation: Comfort feeding.   Discharge Exam: Filed Weights   06/22/21 0049 06/22/21 0538 06/23/21 0400  Weight: 96.3 kg 97.6 kg 97.6 kg   General exam: Experienced acute respiratory distress requiring high level of oxygen supplementation; patient appears to be uncomfortable.  No fever, no chest pain, no nausea vomiting.  Currently afebrile. Respiratory system: Up to 9 L oxygen supplementation at some point through the night required.  Positive tachypnea and diffuse expiratory wheezing/rhonchi appreciated on exam. Cardiovascular system: Rate controlled, no rubs, no gallops Gastrointestinal system: Abdomen is obese nondistended, soft and nontender. No organomegaly or masses felt. Normal bowel sounds heard. Central nervous system: No focal neurologic deficits appreciated. Extremities: No cyanosis or clubbing. Skin: No petechiae. Psychiatry: Stable mood.  Condition at discharge: stable  The results of significant diagnostics from this hospitalization (including imaging, microbiology, ancillary and laboratory) are listed below for reference.   Imaging Studies: DG Chest Port 1 View  Result Date: 06/27/2021 CLINICAL DATA:  Shortness of breath. Hypertension. COPD. EXAM: PORTABLE CHEST 1 VIEW COMPARISON:  One-view chest x-ray 06/27/2021 FINDINGS: Heart size is normal. Atherosclerotic calcifications are present at the aortic arch.  Bibasilar opacities likely reflect atelectasis, not significantly changed. Chronic interstitial coarsening again noted. Left upper lobe pulmonary nodule again seen. Recommend CT chest without contrast to assure stability following previous recommendation for follow-up chest CT in 2020. IMPRESSION: 1. Stable bibasilar atelectasis. 2. Left upper lobe pulmonary nodule. Recommend follow-up CT chest without contrast to assure stability. A follow-up chest CT was recommended on a lung cancer screening CT in September 2020. Electronically Signed   By: Marin Roberts M.D.   On: 06/27/2021 07:55   DG Chest Port 1 View  Result Date: 06/27/2021 CLINICAL DATA:  Hypoxia. EXAM: PORTABLE CHEST 1 VIEW COMPARISON:  Chest x-ray 06/24/2021.  CT of the chest 10/29/2018. FINDINGS: Emphysematous changes are again seen. There are interstitial and patchy airspace opacities in the lower lungs, right greater than left. These have decreased on the left when compared to the prior study. Left upper lobe nodule is again seen, not well characterized on this study. Cardiomediastinal silhouette is stable. No acute fractures are seen. IMPRESSION: 1. Again seen is bibasilar airspace disease, slightly decreasing on the left. 2. Emphysema. 3. Left upper lobe nodule again seen as noted on prior CT from 2020. This can be followed up with chest CT if clinically warranted. Electronically Signed   By: Darliss Cheney M.D.   On: 06/27/2021 03:08   DG CHEST PORT 1 VIEW  Result Date: 06/25/2021 CLINICAL DATA:  [id number].  Chest pain, dyspnea. EXAM: PORTABLE CHEST 1 VIEW COMPARISON:  None Available. FINDINGS: The lungs are mildly hyperinflated with a paucity of vasculature within the apices in keeping with changes of underlying emphysema. Central pulmonary vascular congestion persists and there is developing basilar predominant interstitial pulmonary infiltrate suggesting developing interstitial pulmonary edema superimposed upon underlying interstitial  lung disease. No pneumothorax or pleural effusion. Cardiac size within normal limits., IMPRESSION: Emphysema. Central pulmonary vascular congestion with suspected developing mild interstitial pulmonary edema. Electronically Signed   By: Lyda Kalata.D.  On: 06/25/2021 00:14   DG Chest Port 1 View  Result Date: 06/18/2021 CLINICAL DATA:  Shortness of breath EXAM: PORTABLE CHEST 1 VIEW COMPARISON:  05/02/2021 FINDINGS: Stable heart size. Aortic atherosclerosis. Hyperinflated lungs with emphysematous changes in the upper lung fields. Chronically coarsened bibasilar interstitial markings. No superimposed airspace opacities identified. No pleural effusion or pneumothorax. IMPRESSION: COPD without evidence of superimposed acute cardiopulmonary process. Electronically Signed   By: Davina Poke D.O.   On: 06/18/2021 18:30   ECHOCARDIOGRAM COMPLETE  Result Date: 06/23/2021    ECHOCARDIOGRAM REPORT   Patient Name:   Jacob Pace Date of Exam: 06/23/2021 Medical Rec #:  [id number]       Height:       74.0 in Accession #:    [id number]      Weight:       215.2 lb Date of Birth:  1950-02-09       BSA:          2.242 m Patient Age:    69 years        BP:           156/86 mmHg Patient Gender: M               HR:           93 bpm. Exam Location:  [facility] Procedure: 2D Echo, Cardiac Doppler, Color Doppler and Intracardiac            Opacification Agent Indications:    Atrial Fibrillation  History:        Patient has prior history of Echocardiogram examinations, most                 recent 02/06/2013. COPD, Arrythmias:Tachycardia; Risk                 Factors:Hypertension and Former Smoker.  Sonographer:    Wenda Low Referring Phys: [place] Comments: Image acquisition challenging due to COPD. IMPRESSIONS  1. Left ventricular ejection fraction, by estimation, is 55 to 60%. The left ventricle has normal function. The left ventricle has no regional wall motion abnormalities. There is  mild left ventricular hypertrophy. Left ventricular diastolic parameters are consistent with Grade I diastolic dysfunction (impaired relaxation).  2. Right ventricular systolic function is low normal. The right ventricular size is normal. Mildly increased right ventricular wall thickness. Tricuspid regurgitation signal is inadequate for assessing PA pressure.  3. The mitral valve is grossly normal. No evidence of mitral valve regurgitation.  4. The aortic valve is tricuspid. There is mild calcification of the aortic valve. Aortic valve regurgitation is not visualized. Aortic valve sclerosis is present, with no evidence of aortic valve stenosis. Aortic valve mean gradient measures 3.0 mmHg.  5. The inferior vena cava is normal in size with greater than 50% respiratory variability, suggesting right atrial pressure of 3 mmHg. Comparison(s): Prior images unable to be directly viewed. FINDINGS  Left Ventricle: Left ventricular ejection fraction, by estimation, is 55 to 60%. The left ventricle has normal function. The left ventricle has no regional wall motion abnormalities. Definity contrast agent was given IV to delineate the left ventricular  endocardial borders. The left ventricular internal cavity size was normal in size. There is mild left ventricular hypertrophy. Left ventricular diastolic parameters are consistent with Grade I diastolic dysfunction (impaired relaxation). Right Ventricle: The right ventricular size is normal. Mildly increased right ventricular wall thickness. Right ventricular systolic function is low normal. Tricuspid regurgitation signal  is inadequate for assessing PA pressure. Left Atrium: Left atrial size was normal in size. Right Atrium: Right atrial size was normal in size. Pericardium: There is no evidence of pericardial effusion. Presence of epicardial fat layer. Mitral Valve: The mitral valve is grossly normal. No evidence of mitral valve regurgitation. MV peak gradient, 2.8 mmHg. The mean  mitral valve gradient is 1.0 mmHg. Tricuspid Valve: The tricuspid valve is grossly normal. Tricuspid valve regurgitation is trivial. Aortic Valve: The aortic valve is tricuspid. There is mild calcification of the aortic valve. There is mild aortic valve annular calcification. Aortic valve regurgitation is not visualized. Aortic valve sclerosis is present, with no evidence of aortic valve stenosis. Aortic valve mean gradient measures 3.0 mmHg. Aortic valve peak gradient measures 6.7 mmHg. Aortic valve area, by VTI measures 2.58 cm. Pulmonic Valve: The pulmonic valve was not well visualized. Pulmonic valve regurgitation is not visualized. Aorta: The aortic root is normal in size and structure. Venous: The inferior vena cava is normal in size with greater than 50% respiratory variability, suggesting right atrial pressure of 3 mmHg. IAS/Shunts: No atrial level shunt detected by color flow Doppler.  LEFT VENTRICLE PLAX 2D LVIDd:         4.20 cm     Diastology LVIDs:         2.90 cm     LV e' medial:    7.07 cm/s LV PW:         1.20 cm     LV E/e' medial:  8.1 LV IVS:        1.30 cm     LV e' lateral:   10.20 cm/s LVOT diam:     2.00 cm     LV E/e' lateral: 5.6 LV SV:         58 LV SV Index:   26 LVOT Area:     3.14 cm  LV Volumes (MOD) LV vol d, MOD A2C: 53.3 ml LV vol d, MOD A4C: 51.2 ml LV vol s, MOD A2C: 29.6 ml LV vol s, MOD A4C: 19.9 ml LV SV MOD A2C:     23.7 ml LV SV MOD A4C:     51.2 ml LV SV MOD BP:      28.8 ml RIGHT VENTRICLE RV Basal diam:  4.20 cm RV Mid diam:    3.80 cm RV S prime:     14.90 cm/s TAPSE (M-mode): 2.5 cm LEFT ATRIUM             Index        RIGHT ATRIUM           Index LA diam:        3.70 cm 1.65 cm/m   RA Area:     18.80 cm LA Vol (A2C):   50.1 ml 22.35 ml/m  RA Volume:   56.60 ml  25.24 ml/m LA Vol (A4C):   48.8 ml 21.77 ml/m LA Biplane Vol: 50.1 ml 22.35 ml/m  AORTIC VALVE                    PULMONIC VALVE AV Area (Vmax):    2.37 cm     PV Vmax:       0.76 m/s AV Area (Vmean):    2.26 cm     PV Peak grad:  2.3 mmHg AV Area (VTI):     2.58 cm AV Vmax:           129.00 cm/s AV Vmean:  83.500 cm/s AV VTI:            0.226 m AV Peak Grad:      6.7 mmHg AV Mean Grad:      3.0 mmHg LVOT Vmax:         97.30 cm/s LVOT Vmean:        60.100 cm/s LVOT VTI:          0.186 m LVOT/AV VTI ratio: 0.82  AORTA Ao Root diam: 3.90 cm Ao Asc diam:  3.50 cm MITRAL VALVE MV Area (PHT): 3.42 cm    SHUNTS MV Area VTI:   3.68 cm    Systemic VTI:  0.19 m MV Peak grad:  2.8 mmHg    Systemic Diam: 2.00 cm MV Mean grad:  1.0 mmHg MV Vmax:       0.84 m/s MV Vmean:      51.5 cm/s MV Decel Time: 222 msec MV E velocity: 57.40 cm/s MV A velocity: 87.80 cm/s MV E/A ratio:  0.65 Rozann Lesches MD Electronically signed by Rozann Lesches MD Signature Date/Time: 06/23/2021/12:51:50 PM    Final     Microbiology: Results for orders placed or performed during the hospital encounter of 06/18/21  MRSA Next Gen by PCR, Nasal     Status: None   Collection Time: 06/22/21  1:02 AM   Specimen: Nasal Mucosa; Nasal Swab  Result Value Ref Range Status   MRSA by PCR Next Gen NOT DETECTED NOT DETECTED Final    Comment: (NOTE) The GeneXpert MRSA Assay (FDA approved for NASAL specimens only), is one component of a comprehensive MRSA colonization surveillance program. It is not intended to diagnose MRSA infection nor to guide or monitor treatment for MRSA infections. Test performance is not FDA approved in patients less than 78 years old. Performed at [facility], [address], [place], [place] [address]   Culture, blood (Routine X 2) w Reflex to ID Panel     Status: None (Preliminary result)   Collection Time: 06/27/21  3:31 AM   Specimen: Right Antecubital; Blood  Result Value Ref Range Status   Specimen Description   Final    RIGHT ANTECUBITAL BOTTLES DRAWN AEROBIC AND ANAEROBIC   Special Requests Blood Culture adequate volume  Final   Culture   Final    NO GROWTH 2 DAYS Performed at [facility],  [address] [place], [place] [address]    Report Status PENDING  Incomplete  Culture, blood (Routine X 2) w Reflex to ID Panel     Status: None (Preliminary result)   Collection Time: 06/27/21  3:38 AM   Specimen: BLOOD RIGHT HAND  Result Value Ref Range Status   Specimen Description   Final    BLOOD RIGHT HAND BOTTLES DRAWN AEROBIC AND ANAEROBIC   Special Requests Blood Culture adequate volume  Final   Culture   Final    NO GROWTH 2 DAYS Performed at [facility], [address], [place], [place] [address]    Report Status PENDING  Incomplete    Labs: CBC: Recent Labs  Lab 06/23/21 0358 06/26/21 0407 06/27/21 0247  WBC 13.0* 25.5* 19.8*  HGB 12.4* 12.9* 12.7*  HCT 41.0 42.0 42.7  MCV 93.6 94.0 95.7  PLT 241 204 [id number]   Basic Metabolic Panel: Recent Labs  Lab 06/23/21 0358 06/24/21 2321 06/25/21 2232 06/26/21 0407 06/27/21 0247  NA 139 137 136 137 138  K 4.1 4.1 3.9 4.4 4.3  CL 97* 94* 88* 91* 90*  CO2 35* 31 30 35* 36*  GLUCOSE 166* 180* 190* 174* 175*  BUN 23 21 34* 42* 52*  CREATININE 0.96 0.95 1.48* 1.58* 1.41*  CALCIUM 8.5* 8.9 8.8* 8.5* 8.7*  MG 2.4 2.6* 2.3  --  2.6*   Liver Function Tests: Recent Labs  Lab 06/24/21 2321 06/27/21 0247  AST 19 28  ALT 21 22  ALKPHOS 49 48  BILITOT 1.0 1.2  PROT 6.6 6.3*  ALBUMIN 3.6 3.0*   CBG: Recent Labs  Lab 06/23/21 1605 06/25/21 1127 06/25/21 1523 06/27/21 0226  GLUCAP 144* 147* 156* 174*    Discharge time spent: greater than 30 minutes.  Signed: Barton Dubois, MD [facility] 06/15/2021

## 2021-06-29 NOTE — H&P (Signed)
History and Physical    Patient: Jacob Pace [id number] DOB: 23-Mar-1950 DOA: 06/07/2021 DOS: the patient was seen and examined on 06/02/2021 PCP: [facility]  Patient coming from: Home  Chief Complaint: Shortness of breath/end-of-life care. HIP: Jacob Pace is a 71 y.o. male with medical history significant ofessential hypertension, chronic respiratory failure on supplemental oxygen at 4 LPM and COPD who presents to the emergency department via EMS due to worsening shortness of breath which started last night.  Patient states that he used home albuterol without any improvement.  He activated EMS and on arrival of EMS team, his heart rate was noted to be 150, IV adenosine 6 mg x 1 was given without any effect, so Cardizem 25 mg was given without any improvement.  IV Solu-Medrol 25 mg x 1 was given and patient was placed on CPAP and transported to the ED for further evaluation and management.   ED Course:  In the emergency department, he was tachypneic and initially tachycardic, temperature was 97.10F, patient was transitioned to BiPAP.  Work-up in the ED showed normal CBC except for leukocytosis and normal BMP except for hyperglycemia.  BNP was 44.  Patient hospital course complicated by development of A-fib with RVR/paroxysmal SVT.  Which led to interstitial edema and acute diastolic heart failure triggered by arrhythmia.  Patient also developed acute on chronic hypoxia and hypercapnia respiratory failure with requirement of BiPAP.  After long discussion with palliative care patient has opted to transition into symptomatic management and comfort only.  He has been accepted to residential hospice and while waiting for bed availability has been transitioned to [facility]IP.  Review of Systems: As mentioned in the history of present illness. All other systems reviewed and are negative. Past Medical History:  Diagnosis Date   COPD (chronic obstructive pulmonary disease) ([place])    Essential  hypertension    GERD (gastroesophageal reflux disease)    Mixed hyperlipidemia    Past Surgical History:  Procedure Laterality Date   HERNIA REPAIR Right 2007   Social History:  reports that he quit smoking about 13 years ago. His smoking use included cigarettes. He has a 40.00 pack-year smoking history. He has never used smokeless tobacco. He reports current alcohol use. He reports that he does not use drugs.  No Active Allergies  Family History  Problem Relation Age of Onset   Cancer Father        unsure what type   Breast cancer Sister     Prior to Admission medications   Medication Sig Start Date End Date Taking? Authorizing Provider  albuterol (PROVENTIL) (2.5 MG/3ML) 0.083% nebulizer solution Take 6 mLs (5 mg total) by nebulization every 4 (four) hours as needed for wheezing or shortness of breath. 05/04/21   Johnson, Clanford L, MD  albuterol (VENTOLIN HFA) 108 (90 Base) MCG/ACT inhaler Inhale 1-2 puffs into the lungs every 6 (six) hours as needed for wheezing or shortness of breath.    [provider]  aspirin EC 81 MG tablet Take 81 mg by mouth daily. Swallow whole.    [provider]  azithromycin (ZITHROMAX) 500 MG tablet TAKE 1 TAB PO DAILY Patient not taking: Reported on 06/22/2021 05/05/21   Murlean Iba, MD  budesonide-formoterol (SYMBICORT) 160-4.5 MCG/ACT inhaler Inhale 2 puffs into the lungs 2 (two) times daily. Patient not taking: Reported on 06/22/2021 09/25/20   Roxan Hockey, MD  diltiazem (CARDIZEM CD) 180 MG 24 hr capsule Take 1 capsule (180 mg total)  by mouth daily. Patient not taking: Reported on 06/22/2021 09/25/20   Roxan Hockey, MD  folic acid (FOLVITE) 1 MG tablet Take 1 tablet (1 mg total) by mouth daily. Patient not taking: Reported on 06/22/2021 09/26/20   Roxan Hockey, MD  predniSONE (DELTASONE) 10 MG tablet TAKE 4 TABS DAILY FOR 5 DAYS, THEN 3 TABS DAILY FOR 5 DAYS, THEN RESUME 2 TABS DAILY 05/04/21   Wynetta Emery, Clanford L, MD   tiotropium (SPIRIVA) 18 MCG inhalation capsule Place 1 capsule (18 mcg total) into inhaler and inhale daily. Patient not taking: Reported on 06/22/2021 09/25/20   Roxan Hockey, MD    Physical Exam: General exam: Experienced acute respiratory distress requiring high level of oxygen supplementation; patient appears to be uncomfortable.  No fever, no chest pain, no nausea vomiting.  Currently afebrile. Respiratory system: Up to 9 L oxygen supplementation at some point through the night required.  Positive tachypnea and diffuse expiratory wheezing/rhonchi appreciated on exam. Cardiovascular system: Rate controlled, no rubs, no gallops Gastrointestinal system: Abdomen is obese nondistended, soft and nontender. No organomegaly or masses felt. Normal bowel sounds heard. Central nervous system: No focal neurologic deficits appreciated. Extremities: No cyanosis or clubbing. Skin: No petechiae. Psychiatry: Stable mood.  Data Reviewed: No new data to review.  Assessment and Plan: Acute on chronic respiratory failure with hypoxia and hypercapnia secondary to COPD exacerbation -Steroids and bronchodilator management has been discontinued this morning.  Plan is for symptomatic management only.  Plan is for comfort care only. -Continue oxygen supplementation and the use of morphine for air hunger. -Continue the use of mucolytic's and chest physiotherapy. -ABG demonstrating a CO2 down to 66; patient mentation has improved and is able to follow commands and express his needs.  He is oriented x3. -Goals of care discussion regarding decision for comfort care and symptomatic management only -No further BiPAP.  Patient is DNR/DNI. -Morphine drip initiated per palliative care recommendations. -Life expectancy 2-4 weeks.   Supraventricular tachycardia/paroxysmal atrial fibrillation -Adenosine used on admission and another dose provided on 5/25 and 5/26 at nighttime after experiencing uncontrolled episode  of SVT. -Telemetry has been discontinued; normal Cardizem and Eliquis. -Continue to follow cardiology service recommendations. -Transitioning to comfort care and symptomatic management after discussion of goals of care with palliative care. -Continue the use of metoprolol.   New onset atrial fibrillation with RVR -Cardizem and Eliquis has been discontinued; plan for comfort care and symptomatic management only. -Continue the use of metoprolol. -2D echo demonstrating no wall motion abnormalities and preserved ejection fraction.   -TSH within normal limits.   Leukocytosis appears to be reactive -Currently receiving steroids -Patient is afebrile -Continue to follow WBCs trend/stability. -Continue holding on antibiotics at this point.   -Focusing on comfort care only. -Blood draws.  Essential hypertension (controlled) -Goals of care discussion Cardizem has been discontinued -The use of Lasix and metoprolol only. -Follow vital signs.   Interstitial edema/acute diastolic heart failure -Most likely triggered by arrhythmia -Will continue treatment with IV Lasix, continue to follow daily weights/strict I's and O's. -Repeat chest x-ray demonstrating vascular congestion and chronic emphysematous changes.  No infiltrates. -After goals of care discussion and decision on timing intubations, will focus on comfort care and symptomatic management only. -Medication helping tailor to assist his symptoms.  Lasix and metoprolol has been continue to assist controlling volume and rate.  Anticoagulation of Cardizem has been discontinued.   Acute kidney injury -In the setting of decreased perfusion while using diuretics and having new onset urinary  retention. -No further blood draws will be checked out down as patient has been transition to full comfort care -Last creatinine with a level of 1.58>>1.41.   Overweight -Body mass index is 27.63 kg/m. -Low calorie diet, portion control and increase  physical activity discussed with patient.       Advance Care Planning:   Code Status: DNR comfort measures only.  Consults: Palliative care/hospice.  Family Communication: Brother and wife at bedside.  Severity of Illness: The appropriate patient status for this patient is INPATIENT. Inpatient status is judged to be reasonable and necessary in order to provide the required intensity of service to ensure the patient's safety. The patient's presenting symptoms, physical exam findings, and initial radiographic and laboratory data in the context of their chronic comorbidities is felt to place them at high risk for further clinical deterioration. Furthermore, it is not anticipated that the patient will be medically stable for discharge from the hospital within 2 midnights of admission.   * I certify that at the point of admission it is my clinical judgment that the patient will require inpatient hospital care spanning beyond 2 midnights from the point of admission due to high intensity of service, high risk for further deterioration and high frequency of surveillance required.*  Author: Barton Dubois, MD 06/04/2021 7:27 PM  For on call review [email or web address].

## 2021-06-29 NOTE — TOC Initial Note (Addendum)
Transition of Care [facility]) - Initial/Assessment Note    Patient Details  Name: Jacob Pace MRN: [id number] Date of Birth: 08/10/1950  Transition of Care [facility] CM/SW Contact:    Leitha Bleak, RN Phone Number: 07-Jul-2021, 11:14 AM  Clinical Narrative:     Patient admitted with COPD exacerbation. Palliative consulted with family. Patient is now comfort. Patient is declining, TOC will follow to set up discharge plan.            Addendum: Palliative consulted TOC to refer patient to [facility] for a residential bed. Referral sent.    Barriers to Discharge: Other (must enter comment)  Patient Goals and CMS Choice Patient states their goals for this hospitalization and ongoing recovery are:: to be comfortable CMS Medicare.gov Compare Post Acute Care list provided to:: Patient Choice offered to / list presented to : Patient  Expected Discharge Plan and Services      Prior Living Arrangements/Services    Activities of Daily Living Home Assistive Devices/Equipment: Oxygen ADL Screening (condition at time of admission) Patient's cognitive ability adequate to safely complete daily activities?: No Is the patient deaf or have difficulty hearing?: Yes Does the patient have difficulty seeing, even when wearing glasses/contacts?: Yes Does the patient have difficulty concentrating, remembering, or making decisions?: No Patient able to express need for assistance with ADLs?: No Does the patient have difficulty dressing or bathing?: No Independently performs ADLs?: Yes (appropriate for developmental age) Does the patient have difficulty walking or climbing stairs?: No Weakness of Legs: None Weakness of Arms/Hands: None  Permission Sought/Granted     Admission diagnosis:  SVT (supraventricular tachycardia) (HCC) [I47.1] COPD exacerbation (HCC) [J44.1] COPD with acute exacerbation (HCC) [J44.1] Patient Active Problem List   Diagnosis Date Noted   COPD with acute exacerbation  (HCC) 06/19/2021   Hyperglycemia 06/18/2021   Fall at home 05/02/2021   Acute exacerbation of chronic obstructive pulmonary disease (COPD) (HCC) 12/08/2020   Dependence on supplemental oxygen when ambulating 10/21/2020   Elevated blood-pressure reading without diagnosis of hypertension 10/21/2020   Hypoxemia 10/21/2020   Other and unspecified hyperlipidemia 10/21/2020   Other specified counseling 10/21/2020   Raised prostate specific antigen 10/21/2020   Tobacco user 10/21/2020   Gastroesophageal reflux disease 10/21/2020   Leukocytosis 04/27/2020   COPD exacerbation (HCC) 04/27/2020   Chronic respiratory failure with hypoxia, on home O2 therapy (HCC) 02/03/2020   Essential (primary) hypertension 02/03/2020   Chronic obstructive pulmonary disease with (acute) lower respiratory infection (HCC) 02/03/2020   Acute on chronic respiratory failure with hypoxia (HCC) 02/08/2013   PSVT (paroxysmal supraventricular tachycardia) (HCC) 02/07/2013   Chronic obstructive pulmonary disease (HCC) 02/05/2013   BRONCHITIS, ACUTE 04/02/2010   GASTRITIS 04/17/2008   GASTROINTESTINAL XRAY, ABNORMAL 04/11/2008   EMPHYSEMA 06/15/2007   Nonspecific (abnormal) findings on radiological and other examination of body structure 06/15/2007   ABNORMAL CHEST XRAY 06/15/2007   PCP:  [facility]:   [facility] [address] [place] - [address] [place] [address] Phone: [phone] Fax: [phone]   Readmission Risk Interventions    July 07, 2021   11:13 AM  Readmission Risk Prevention Plan  Transportation Screening Complete  PCP or Specialist Appt within 3-5 Days Not Complete  HRI or Home Care Consult Complete  Social Work Consult for Recovery Care Planning/Counseling Complete  Palliative Care Screening Complete  Medication Review [employer]) Complete

## 2021-06-29 NOTE — Progress Notes (Addendum)
Daily Progress Note   Patient Name: Jacob Pace       Date: 06/11/2021 DOB: August 18, 1950  Age: 71 y.o. MRN#: [id number] Attending Physician: Barton Dubois, MD Primary Care Physician: [facility] Admit Date: 06/18/2021  Reason for Consultation/Follow-up: Establishing goals of care  Patient Profile/HPI:  71 y.o. male  with past medical history of HTN, emphysematous COPD with very severe obstruction and chronic respiratory failure on home oxygen 4L, admitted 4/2-4/4 with exacerbation requiring bipap,  admitted on 06/18/2021 with SOB. Found to be in SVT requiring adenosine, started on cardizem, COPD exacerbation- started on bipap. A-fib with RVR- started on amiodarone temporarily. He was slowly improving, but remained SOB throughout admission. 5/26 had recurrence of SVT required adenosine again. ABG's have shown hypercarbia and hypoxia. He was declining breathing treatments and IS, also declining to wear bipap. On 5/28 he had an episode of unresponsiveness and oxygen desaturation. Bipap restarted. He is having urinary retention.  Palliative medicine consulted for goals of care.      Subjective: Chart reviewed- discussed with nursing. Patient received- 6mg  IV morphine overnight. His oxygen was increased 10L overnight. He appears uncomfortable this morning- using abdominal accessory muscles for breathing, audible secretions. He is not responsive.  Discussed with family my concern that he is deteriorating rapidly- I don't think he is stable for transfer to hospice facility.    Review of Systems  Unable to perform ROS: Acuity of condition    Physical Exam Vitals and nursing note reviewed.  Constitutional:      Appearance: He is ill-appearing.  Pulmonary:     Comments: Increased effort  and rate           Vital Signs: BP 128/63 (BP Location: Left Arm)   Pulse 73   Temp 97.9 F (36.6 C) (Oral)   Resp 13   Ht 6\' 2"  (1.88 m)   Wt 97.6 kg   SpO2 (!) 86%   BMI 27.63 kg/m  SpO2: SpO2: (!) 86 % O2 Device: O2 Device: Nasal Cannula O2 Flow Rate: O2 Flow Rate (L/min): 9 L/min  Intake/output summary:  Intake/Output Summary (Last 24 hours) at 06/14/2021 1046 Last data filed at 06/28/2021 0600 Gross per 24 hour  Intake 120 ml  Output 650 ml  Net -530 ml   LBM: Last BM Date :  06/21/21 Baseline Weight: Weight: 103.1 kg Most recent weight: Weight: 97.6 kg       Palliative Assessment/Data: PPS: 10%      Patient Active Problem List   Diagnosis Date Noted   COPD with acute exacerbation ([place]) 06/19/2021   Hyperglycemia 06/18/2021   Fall at home 05/02/2021   Acute exacerbation of chronic obstructive pulmonary disease (COPD) ([place]) 12/08/2020   Dependence on supplemental oxygen when ambulating 10/21/2020   Elevated blood-pressure reading without diagnosis of hypertension 10/21/2020   Hypoxemia 10/21/2020   Other and unspecified hyperlipidemia 10/21/2020   Other specified counseling 10/21/2020   Raised prostate specific antigen 10/21/2020   Tobacco user 10/21/2020   Gastroesophageal reflux disease 10/21/2020   Leukocytosis 04/27/2020   COPD exacerbation ([place]) 04/27/2020   Chronic respiratory failure with hypoxia, on home O2 therapy ([place]) 02/03/2020   Essential (primary) hypertension 02/03/2020   Chronic obstructive pulmonary disease with (acute) lower respiratory infection ([place]) 02/03/2020   Acute on chronic respiratory failure with hypoxia ([place]) 02/08/2013   PSVT (paroxysmal supraventricular tachycardia) ([place]) 02/07/2013   Chronic obstructive pulmonary disease ([place]) 02/05/2013   BRONCHITIS, ACUTE 04/02/2010   GASTRITIS 04/17/2008   GASTROINTESTINAL XRAY, ABNORMAL 04/11/2008   EMPHYSEMA 06/15/2007   Nonspecific (abnormal) findings on radiological and other  examination of body structure 06/15/2007   ABNORMAL CHEST XRAY 06/15/2007    Palliative Care Assessment & Plan    Assessment/Recommendations/Plan  Start morphine infusion 2mg /hr with 2mg  bolus q15 min prn-  Discussed with nursing- order exists to not increase oxygen- give morphine and or ativan for SOB Addendum- in to re-evaluate patient- he was sitting on edge of bed- responding verbally to questions- breathing more comfortable- he and family are in agreement to referral for hospice house placement  Code Status: DNR  Prognosis:  < 2 weeks  Discharge Planning: Hospice facility  Care plan was discussed with care team.  Thank you for allowing the Palliative Medicine Team to assist in the care of this patient.  Total time:  80 mins      Greater than 50%  of this time was spent counseling and coordinating care related to the above assessment and plan.  Mariana Kaufman, AGNP-C Palliative Medicine   Please contact Palliative Medicine Team phone at [phone] for questions and concerns.

## 2021-06-30 DIAGNOSIS — N179 Acute kidney failure, unspecified: Secondary | ICD-10-CM

## 2021-06-30 DIAGNOSIS — Z515 Encounter for palliative care: Secondary | ICD-10-CM

## 2021-06-30 DIAGNOSIS — I480 Paroxysmal atrial fibrillation: Secondary | ICD-10-CM

## 2021-06-30 DIAGNOSIS — J441 Chronic obstructive pulmonary disease with (acute) exacerbation: Secondary | ICD-10-CM

## 2021-06-30 DIAGNOSIS — J9621 Acute and chronic respiratory failure with hypoxia: Secondary | ICD-10-CM

## 2021-06-30 DIAGNOSIS — J9622 Acute and chronic respiratory failure with hypercapnia: Secondary | ICD-10-CM

## 2021-07-01 NOTE — Progress Notes (Signed)
00:30 patient bladder scan showing >1073ml retention. Contacted hospice care at [phone] to request for indwelling urinary catheter order. Instructed by hospice RN to go through our provider. Order obtained.

## 2021-07-01 NOTE — Progress Notes (Signed)
Patient with no respirations and no heartbeat. Verified by 2 Nurses. MD, [facility], family and [employer] notified of patients death. Per [employer] patient is to be placed on hold for possible donation.

## 2021-07-01 DEATH — deceased

## 2021-07-03 LAB — CULTURE, BLOOD (ROUTINE X 2)
Culture: NO GROWTH
Culture: NO GROWTH
Special Requests: ADEQUATE
Special Requests: ADEQUATE

## 2021-07-29 NOTE — Telephone Encounter (Signed)
Since calling the office, pt has passed away. Closing encounter. 

## 2021-07-31 NOTE — Death Summary Note (Signed)
DEATH SUMMARY   Patient Details  Name: Jacob Pace MRN: [id number] DOB: 18-Jan-1951 [id number], Hedda Slade Medical Admission/Discharge Information   Admit Date:  July 27, 2021  Date of Death: Date of Death: 28-Jul-2021 (02:06)  Time of Death: Time of Death: 0206  Length of Stay: 1   Principle Cause of death: acute on chronic resp failure with hypoxia and hypercapnia.  Hospital Diagnoses: Principal Problem:   Acute on chronic respiratory failure with hypoxia and hypercapnia (HCC) Active Problems:   EMPHYSEMA   PSVT (paroxysmal supraventricular tachycardia) (HCC)   COPD exacerbation (HCC)   Terminal care   PAF (paroxysmal atrial fibrillation) (HCC)   AKI (acute kidney injury) ([place]) Urinary retention Overweight  Acute interstitial edema/acute diastolic HF  Hospital Course: Jacob Pace is a 71 y.o. male with medical history significant of essential hypertension, chronic respiratory failure on supplemental oxygen at 4 LPM and COPD who presents to the emergency department via EMS due to worsening shortness of breath which started last night.  He was admitted for acute on chronic hypoxemic respiratory failure secondary to COPD exacerbation.  He was showing signs of improvement, but overnight on 5/22 he went into new onset atrial fibrillation with RVR and was started on amiodarone infusion.  We will transfer to telemetry bed.  A-fib now controlled with adjusted dose of Cardizem and not requiring amiodarone.  Continue treatment for COPD exacerbation.  Assessment and Plan: Acute on chronic respiratory failure with hypoxia and hypercapnia secondary to COPD exacerbation -Steroids and bronchodilator management has been discontinued this morning.  Plan is for symptomatic management only.  Plan is for comfort care only. -Continue oxygen supplementation and the use of morphine for air hunger. -Continue the use of mucolytic's and chest physiotherapy. -ABG demonstrating a CO2 down to 66; patient  mentation has improved and is able to follow commands and express his needs.  He is oriented x3. -Goals of care discussion regarding decision for comfort care and symptomatic management only -No further BiPAP.  Patient is DNR/DNI. -Morphine drip initiated per palliative care recommendations. -Life expectancy 2-4 weeks.   Supraventricular tachycardia/paroxysmal atrial fibrillation -Adenosine used on admission and another dose provided on 5/25 and 5/26 at nighttime after experiencing uncontrolled episode of SVT. -Telemetry has been discontinued; normal Cardizem and Eliquis. -Continue to follow cardiology service recommendations. -Transitioning to comfort care and symptomatic management after discussion of goals of care with palliative care. -Continue the use of metoprolol.   New onset atrial fibrillation with RVR -Cardizem and Eliquis has been discontinued; plan for comfort care and symptomatic management only. -Continue the use of metoprolol. -2D echo demonstrating no wall motion abnormalities and preserved ejection fraction.   -TSH within normal limits.   Leukocytosis appears to be reactive -Currently receiving steroids -Patient is afebrile -Continue to follow WBCs trend/stability. -Continue holding on antibiotics at this point.   -Focusing on comfort care only. -Blood draws.  Essential hypertension (controlled) -Goals of care discussion Cardizem has been discontinued -The use of Lasix and metoprolol only. -Follow vital signs.   Interstitial edema/acute diastolic heart failure -Most likely triggered by arrhythmia -Will continue treatment with IV Lasix, continue to follow daily weights/strict I's and O's. -Repeat chest x-ray demonstrating vascular congestion and chronic emphysematous changes.  No infiltrates. -After goals of care discussion and decision on timing intubations, will focus on comfort care and symptomatic management only. -Medication helping tailor to assist his  symptoms.  Lasix and metoprolol has been continue to assist controlling volume and rate.  Anticoagulation of  Cardizem has been discontinued.   Acute kidney injury -In the setting of decreased perfusion while using diuretics and having new onset urinary retention. -No further blood draws will be checked out down as patient has been transition to full comfort care -Last creatinine with a level of 1.58>>1.41.   Overweight -Body mass index is 27.63 kg/m. -Low calorie diet, portion control and increase physical activity discussed with patient.    Procedures: see below for x-ray reports and any procedure details.   Consultations: cardiology service palliative care, hospice.  The results of significant diagnostics from this hospitalization (including imaging, microbiology, ancillary and laboratory) are listed below for reference.   Significant Diagnostic Studies: DG Chest Port 1 View  Result Date: 06/27/2021 CLINICAL DATA:  Shortness of breath. Hypertension. COPD. EXAM: PORTABLE CHEST 1 VIEW COMPARISON:  One-view chest x-ray 06/27/2021 FINDINGS: Heart size is normal. Atherosclerotic calcifications are present at the aortic arch. Bibasilar opacities likely reflect atelectasis, not significantly changed. Chronic interstitial coarsening again noted. Left upper lobe pulmonary nodule again seen. Recommend CT chest without contrast to assure stability following previous recommendation for follow-up chest CT in 2020. IMPRESSION: 1. Stable bibasilar atelectasis. 2. Left upper lobe pulmonary nodule. Recommend follow-up CT chest without contrast to assure stability. A follow-up chest CT was recommended on a lung cancer screening CT in September 2020. Electronically Signed   By: Marin Roberts M.D.   On: 06/27/2021 07:55   DG Chest Port 1 View  Result Date: 06/27/2021 CLINICAL DATA:  Hypoxia. EXAM: PORTABLE CHEST 1 VIEW COMPARISON:  Chest x-ray 06/24/2021.  CT of the chest 10/29/2018. FINDINGS:  Emphysematous changes are again seen. There are interstitial and patchy airspace opacities in the lower lungs, right greater than left. These have decreased on the left when compared to the prior study. Left upper lobe nodule is again seen, not well characterized on this study. Cardiomediastinal silhouette is stable. No acute fractures are seen. IMPRESSION: 1. Again seen is bibasilar airspace disease, slightly decreasing on the left. 2. Emphysema. 3. Left upper lobe nodule again seen as noted on prior CT from 2020. This can be followed up with chest CT if clinically warranted. Electronically Signed   By: Darliss Cheney M.D.   On: 06/27/2021 03:08   DG CHEST PORT 1 VIEW  Result Date: 06/25/2021 CLINICAL DATA:  [id number].  Chest pain, dyspnea. EXAM: PORTABLE CHEST 1 VIEW COMPARISON:  None Available. FINDINGS: The lungs are mildly hyperinflated with a paucity of vasculature within the apices in keeping with changes of underlying emphysema. Central pulmonary vascular congestion persists and there is developing basilar predominant interstitial pulmonary infiltrate suggesting developing interstitial pulmonary edema superimposed upon underlying interstitial lung disease. No pneumothorax or pleural effusion. Cardiac size within normal limits., IMPRESSION: Emphysema. Central pulmonary vascular congestion with suspected developing mild interstitial pulmonary edema. Electronically Signed   By: Helyn Numbers M.D.   On: 06/25/2021 00:14   DG Chest Port 1 View  Result Date: 06/18/2021 CLINICAL DATA:  Shortness of breath EXAM: PORTABLE CHEST 1 VIEW COMPARISON:  05/02/2021 FINDINGS: Stable heart size. Aortic atherosclerosis. Hyperinflated lungs with emphysematous changes in the upper lung fields. Chronically coarsened bibasilar interstitial markings. No superimposed airspace opacities identified. No pleural effusion or pneumothorax. IMPRESSION: COPD without evidence of superimposed acute cardiopulmonary process. Electronically  Signed   By: Duanne Guess D.O.   On: 06/18/2021 18:30   ECHOCARDIOGRAM COMPLETE  Result Date: 06/23/2021    ECHOCARDIOGRAM REPORT   Patient Name:   Daxen L Wilhoite Date of Exam:  06/23/2021 Medical Rec #:  [id number]       Height:       74.0 in Accession #:    [id number]      Weight:       215.2 lb Date of Birth:  05/14/1950       BSA:          2.242 m Patient Age:    3 years        BP:           156/86 mmHg Patient Gender: M               HR:           93 bpm. Exam Location:  [facility] Procedure: 2D Echo, Cardiac Doppler, Color Doppler and Intracardiac            Opacification Agent Indications:    Atrial Fibrillation  History:        Patient has prior history of Echocardiogram examinations, most                 recent 02/06/2013. COPD, Arrythmias:Tachycardia; Risk                 Factors:Hypertension and Former Smoker.  Sonographer:    Wenda Low Referring Phys: [place] Comments: Image acquisition challenging due to COPD. IMPRESSIONS  1. Left ventricular ejection fraction, by estimation, is 55 to 60%. The left ventricle has normal function. The left ventricle has no regional wall motion abnormalities. There is mild left ventricular hypertrophy. Left ventricular diastolic parameters are consistent with Grade I diastolic dysfunction (impaired relaxation).  2. Right ventricular systolic function is low normal. The right ventricular size is normal. Mildly increased right ventricular wall thickness. Tricuspid regurgitation signal is inadequate for assessing PA pressure.  3. The mitral valve is grossly normal. No evidence of mitral valve regurgitation.  4. The aortic valve is tricuspid. There is mild calcification of the aortic valve. Aortic valve regurgitation is not visualized. Aortic valve sclerosis is present, with no evidence of aortic valve stenosis. Aortic valve mean gradient measures 3.0 mmHg.  5. The inferior vena cava is normal in size with greater than 50% respiratory  variability, suggesting right atrial pressure of 3 mmHg. Comparison(s): Prior images unable to be directly viewed. FINDINGS  Left Ventricle: Left ventricular ejection fraction, by estimation, is 55 to 60%. The left ventricle has normal function. The left ventricle has no regional wall motion abnormalities. Definity contrast agent was given IV to delineate the left ventricular  endocardial borders. The left ventricular internal cavity size was normal in size. There is mild left ventricular hypertrophy. Left ventricular diastolic parameters are consistent with Grade I diastolic dysfunction (impaired relaxation). Right Ventricle: The right ventricular size is normal. Mildly increased right ventricular wall thickness. Right ventricular systolic function is low normal. Tricuspid regurgitation signal is inadequate for assessing PA pressure. Left Atrium: Left atrial size was normal in size. Right Atrium: Right atrial size was normal in size. Pericardium: There is no evidence of pericardial effusion. Presence of epicardial fat layer. Mitral Valve: The mitral valve is grossly normal. No evidence of mitral valve regurgitation. MV peak gradient, 2.8 mmHg. The mean mitral valve gradient is 1.0 mmHg. Tricuspid Valve: The tricuspid valve is grossly normal. Tricuspid valve regurgitation is trivial. Aortic Valve: The aortic valve is tricuspid. There is mild calcification of the aortic valve. There is mild aortic valve annular calcification. Aortic valve regurgitation is not visualized. Aortic valve  sclerosis is present, with no evidence of aortic valve stenosis. Aortic valve mean gradient measures 3.0 mmHg. Aortic valve peak gradient measures 6.7 mmHg. Aortic valve area, by VTI measures 2.58 cm. Pulmonic Valve: The pulmonic valve was not well visualized. Pulmonic valve regurgitation is not visualized. Aorta: The aortic root is normal in size and structure. Venous: The inferior vena cava is normal in size with greater than 50%  respiratory variability, suggesting right atrial pressure of 3 mmHg. IAS/Shunts: No atrial level shunt detected by color flow Doppler.  LEFT VENTRICLE PLAX 2D LVIDd:         4.20 cm     Diastology LVIDs:         2.90 cm     LV e' medial:    7.07 cm/s LV PW:         1.20 cm     LV E/e' medial:  8.1 LV IVS:        1.30 cm     LV e' lateral:   10.20 cm/s LVOT diam:     2.00 cm     LV E/e' lateral: 5.6 LV SV:         58 LV SV Index:   26 LVOT Area:     3.14 cm  LV Volumes (MOD) LV vol d, MOD A2C: 53.3 ml LV vol d, MOD A4C: 51.2 ml LV vol s, MOD A2C: 29.6 ml LV vol s, MOD A4C: 19.9 ml LV SV MOD A2C:     23.7 ml LV SV MOD A4C:     51.2 ml LV SV MOD BP:      28.8 ml RIGHT VENTRICLE RV Basal diam:  4.20 cm RV Mid diam:    3.80 cm RV S prime:     14.90 cm/s TAPSE (M-mode): 2.5 cm LEFT ATRIUM             Index        RIGHT ATRIUM           Index LA diam:        3.70 cm 1.65 cm/m   RA Area:     18.80 cm LA Vol (A2C):   50.1 ml 22.35 ml/m  RA Volume:   56.60 ml  25.24 ml/m LA Vol (A4C):   48.8 ml 21.77 ml/m LA Biplane Vol: 50.1 ml 22.35 ml/m  AORTIC VALVE                    PULMONIC VALVE AV Area (Vmax):    2.37 cm     PV Vmax:       0.76 m/s AV Area (Vmean):   2.26 cm     PV Peak grad:  2.3 mmHg AV Area (VTI):     2.58 cm AV Vmax:           129.00 cm/s AV Vmean:          83.500 cm/s AV VTI:            0.226 m AV Peak Grad:      6.7 mmHg AV Mean Grad:      3.0 mmHg LVOT Vmax:         97.30 cm/s LVOT Vmean:        60.100 cm/s LVOT VTI:          0.186 m LVOT/AV VTI ratio: 0.82  AORTA Ao Root diam: 3.90 cm Ao Asc diam:  3.50 cm MITRAL VALVE MV Area (PHT): 3.42 cm  SHUNTS MV Area VTI:   3.68 cm    Systemic VTI:  0.19 m MV Peak grad:  2.8 mmHg    Systemic Diam: 2.00 cm MV Mean grad:  1.0 mmHg MV Vmax:       0.84 m/s MV Vmean:      51.5 cm/s MV Decel Time: 222 msec MV E velocity: 57.40 cm/s MV A velocity: 87.80 cm/s MV E/A ratio:  0.65 Rozann Lesches MD Electronically signed by Rozann Lesches MD Signature Date/Time:  06/23/2021/12:51:50 PM    Final     Microbiology: Recent Results (from the past 240 hour(s))  MRSA Next Gen by PCR, Nasal     Status: None   Collection Time: 06/22/21  1:02 AM   Specimen: Nasal Mucosa; Nasal Swab  Result Value Ref Range Status   MRSA by PCR Next Gen NOT DETECTED NOT DETECTED Final    Comment: (NOTE) The GeneXpert MRSA Assay (FDA approved for NASAL specimens only), is one component of a comprehensive MRSA colonization surveillance program. It is not intended to diagnose MRSA infection nor to guide or monitor treatment for MRSA infections. Test performance is not FDA approved in patients less than 33 years old. Performed at [facility], [address], [place], [place] [address]   Culture, blood (Routine X 2) w Reflex to ID Panel     Status: None (Preliminary result)   Collection Time: 06/27/21  3:31 AM   Specimen: Right Antecubital; Blood  Result Value Ref Range Status   Specimen Description   Final    RIGHT ANTECUBITAL BOTTLES DRAWN AEROBIC AND ANAEROBIC   Special Requests Blood Culture adequate volume  Final   Culture   Final    NO GROWTH 4 DAYS Performed at [facility], [address] [place], [place] [address]    Report Status PENDING  Incomplete  Culture, blood (Routine X 2) w Reflex to ID Panel     Status: None (Preliminary result)   Collection Time: 06/27/21  3:38 AM   Specimen: BLOOD RIGHT HAND  Result Value Ref Range Status   Specimen Description   Final    BLOOD RIGHT HAND BOTTLES DRAWN AEROBIC AND ANAEROBIC   Special Requests Blood Culture adequate volume  Final   Culture   Final    NO GROWTH 4 DAYS Performed at [facility], [address], [place], [place] [address]    Report Status PENDING  Incomplete    Signed: Barton Dubois, MD 2021-07-22

## 2022-05-08 IMAGING — DX DG CHEST 1V PORT
2 series · 2 of 2 positions shown · non-contrast
Comparison: 12/08/2020

CLINICAL DATA: Shortness of breath

EXAM:
PORTABLE CHEST 1 VIEW

[chest ap (1 of 2)]
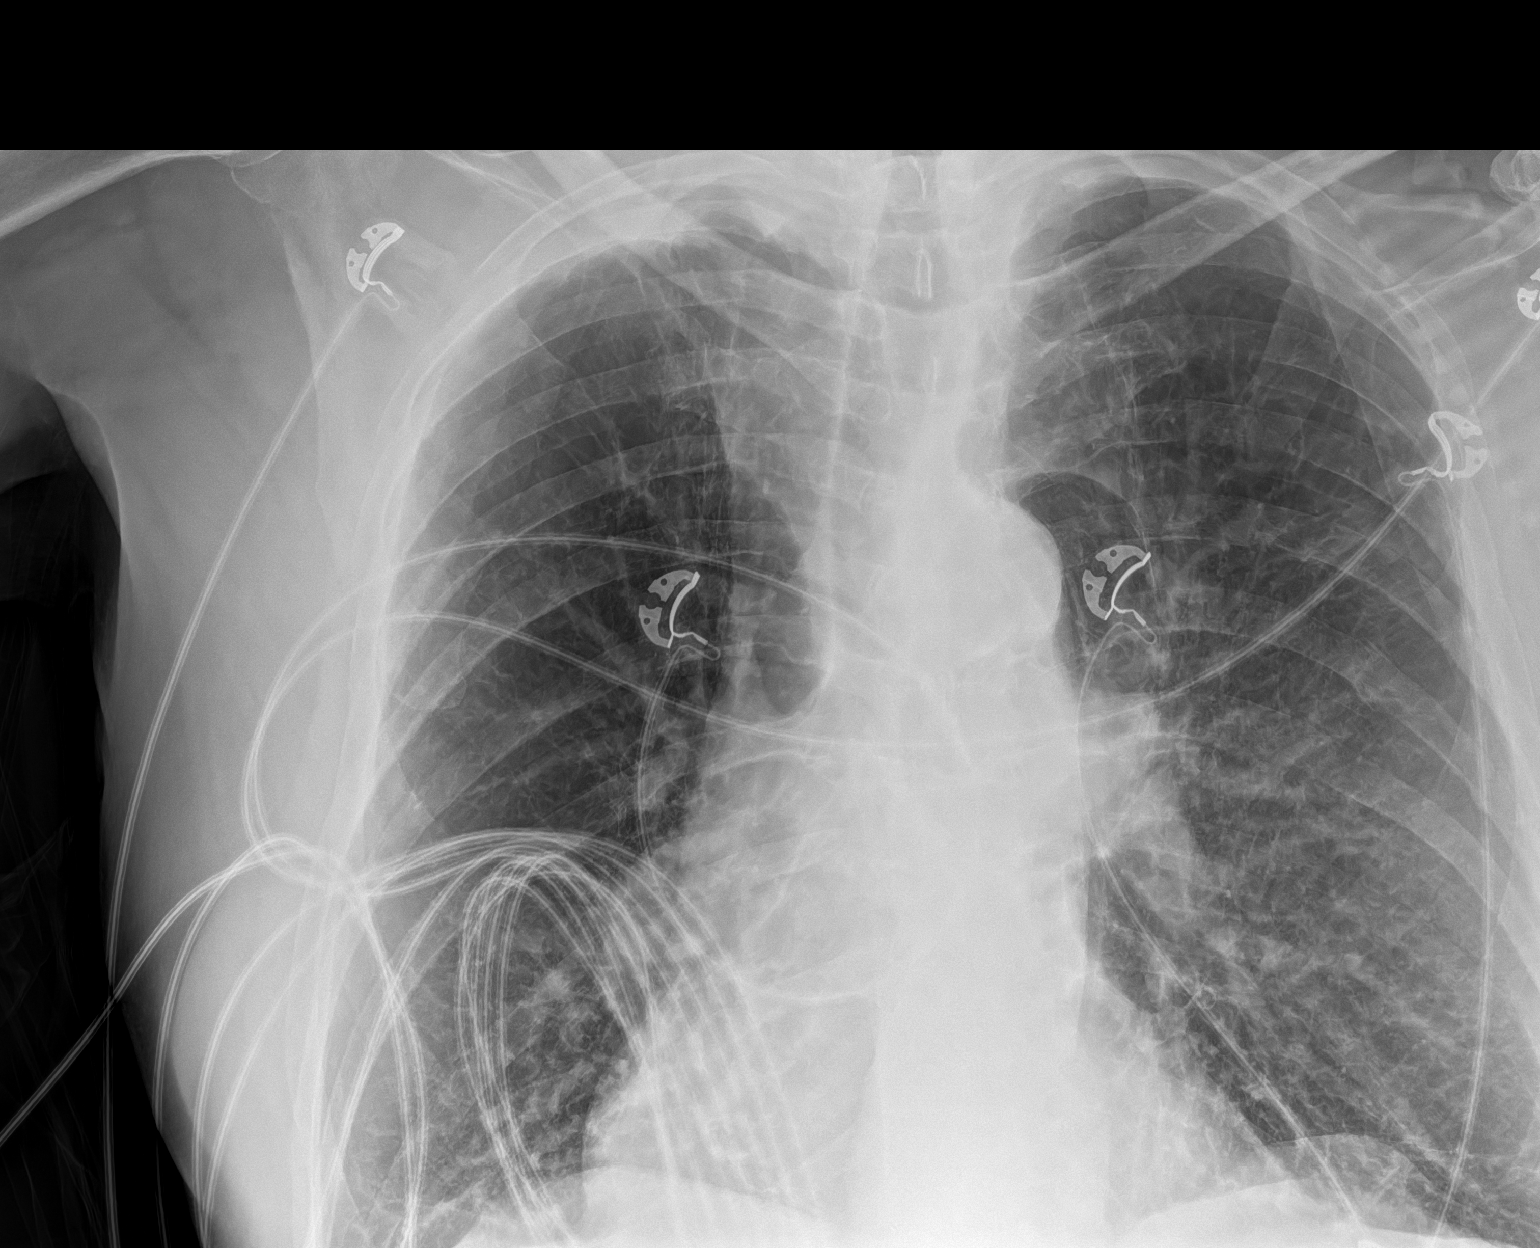

[chest ap (2 of 2)]
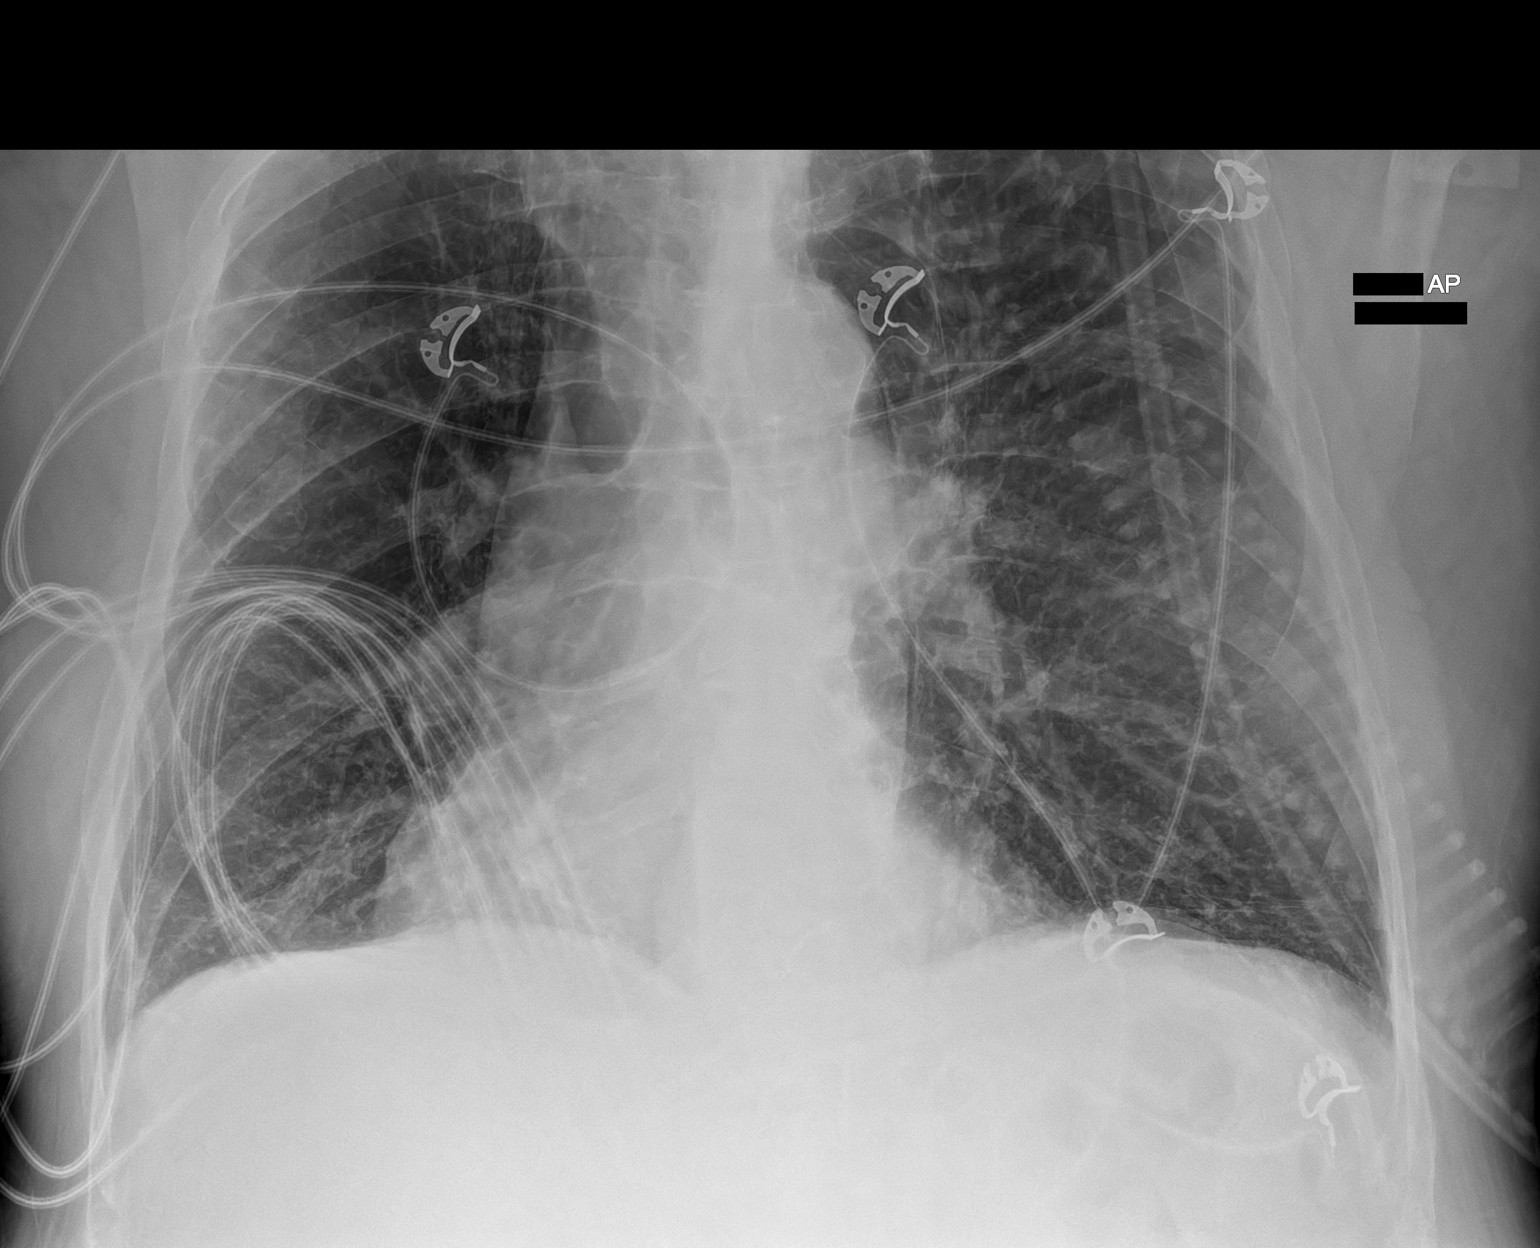

[2 of 2 positions shown; findings below may reference images not displayed]

FINDINGS: Heart size and pulmonary vascularity are normal. Small esophageal
hiatal hernia suggested behind the heart. Emphysematous changes and
fibrosis throughout the lungs. Peribronchial thickening with
suggestion of bronchiectasis in the bases. Bilateral apical
scarring, greater on the right. No pleural effusions. No
pneumothorax. No focal consolidation.
IMPRESSION: Emphysematous and bronchitic changes in the lungs. Scattered
fibrosis. No focal consolidation.
